# Patient Record
Sex: Female | Born: 1960
Health system: Southern US, Community
[De-identification: ages and names within clinical notes are randomized; demographics above are authoritative.]

## PROBLEM LIST (undated history)

## (undated) DIAGNOSIS — T4145XA Adverse effect of unspecified anesthetic, initial encounter: Secondary | ICD-10-CM

## (undated) DIAGNOSIS — M0520 Rheumatoid vasculitis with rheumatoid arthritis of unspecified site: Secondary | ICD-10-CM

## (undated) DIAGNOSIS — M1990 Unspecified osteoarthritis, unspecified site: Secondary | ICD-10-CM

## (undated) DIAGNOSIS — R7303 Prediabetes: Secondary | ICD-10-CM

## (undated) DIAGNOSIS — R32 Unspecified urinary incontinence: Secondary | ICD-10-CM

## (undated) DIAGNOSIS — G709 Myoneural disorder, unspecified: Secondary | ICD-10-CM

## (undated) DIAGNOSIS — R011 Cardiac murmur, unspecified: Secondary | ICD-10-CM

## (undated) DIAGNOSIS — D219 Benign neoplasm of connective and other soft tissue, unspecified: Secondary | ICD-10-CM

## (undated) DIAGNOSIS — M069 Rheumatoid arthritis, unspecified: Secondary | ICD-10-CM

## (undated) DIAGNOSIS — T8859XA Other complications of anesthesia, initial encounter: Secondary | ICD-10-CM

## (undated) DIAGNOSIS — F32A Depression, unspecified: Secondary | ICD-10-CM

## (undated) HISTORY — PX: JOINT REPLACEMENT: SHX530

## (undated) HISTORY — DX: Rheumatoid vasculitis with rheumatoid arthritis of unspecified site: M05.20

## (undated) HISTORY — DX: Unspecified urinary incontinence: R32

## (undated) HISTORY — PX: FOOT SURGERY: SHX648

## (undated) HISTORY — DX: Benign neoplasm of connective and other soft tissue, unspecified: D21.9

---
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
## 1987-04-08 HISTORY — PX: REPLACEMENT TOTAL KNEE: SUR1224

## 2002-04-07 HISTORY — PX: FOOT SURGERY: SHX648

## 2004-04-07 HISTORY — PX: POSTERIOR FUSION CERVICAL SPINE: SUR628

## 2004-04-07 HISTORY — PX: KNEE SURGERY: SHX244

## 2004-04-08 ENCOUNTER — Ambulatory Visit: Payer: Self-pay | Admitting: Orthopedic Surgery

## 2004-04-24 ENCOUNTER — Ambulatory Visit: Payer: Self-pay | Admitting: Orthopedic Surgery

## 2004-05-01 ENCOUNTER — Ambulatory Visit: Payer: Self-pay | Admitting: Orthopedic Surgery

## 2004-05-15 ENCOUNTER — Ambulatory Visit: Payer: Self-pay | Admitting: Orthopedic Surgery

## 2004-05-21 ENCOUNTER — Encounter (HOSPITAL_COMMUNITY): Admission: RE | Admit: 2004-05-21 | Discharge: 2004-06-20 | Payer: Self-pay | Admitting: Orthopedic Surgery

## 2004-05-23 ENCOUNTER — Ambulatory Visit: Payer: Self-pay | Admitting: Orthopedic Surgery

## 2004-06-13 ENCOUNTER — Ambulatory Visit: Payer: Self-pay | Admitting: Orthopedic Surgery

## 2004-06-20 ENCOUNTER — Encounter (HOSPITAL_COMMUNITY): Admission: RE | Admit: 2004-06-20 | Discharge: 2004-07-20 | Payer: Self-pay | Admitting: Orthopedic Surgery

## 2004-08-08 ENCOUNTER — Ambulatory Visit: Payer: Self-pay | Admitting: Orthopedic Surgery

## 2004-11-11 ENCOUNTER — Ambulatory Visit: Payer: Self-pay | Admitting: Orthopedic Surgery

## 2005-01-01 ENCOUNTER — Inpatient Hospital Stay (HOSPITAL_COMMUNITY): Admission: RE | Admit: 2005-01-01 | Discharge: 2005-01-04 | Payer: Self-pay | Admitting: Neurosurgery

## 2006-04-07 HISTORY — PX: FOOT SURGERY: SHX648

## 2007-09-13 ENCOUNTER — Encounter: Payer: Self-pay | Admitting: Orthopedic Surgery

## 2007-11-10 ENCOUNTER — Ambulatory Visit: Payer: Self-pay | Admitting: Hematology

## 2008-05-18 ENCOUNTER — Encounter: Payer: Self-pay | Admitting: Orthopedic Surgery

## 2009-10-18 ENCOUNTER — Encounter: Payer: Self-pay | Admitting: Orthopedic Surgery

## 2009-11-27 ENCOUNTER — Encounter: Admission: RE | Admit: 2009-11-27 | Discharge: 2010-01-03 | Payer: Self-pay | Admitting: Rheumatology

## 2010-05-07 NOTE — Letter (Signed)
Summary: [facility] Office note  Dr Dessie Coma Office note  Dr Channing Mutters   Imported By: Cammie Sickle 10/30/2009 16:26:10  _____________________________________________________________________  External Attachment:    Type:   Image     Comment:   External Document

## 2010-05-07 NOTE — Letter (Signed)
Summary: [facility] Office note Dr Dessie Coma Office note Dr Channing Mutters   Imported By: Cammie Sickle 05/29/2008 15:12:29  _____________________________________________________________________  External Attachment:    Type:   Image     Comment:   External Document

## 2010-05-07 NOTE — Letter (Signed)
Summary: [facility] Dr Dessie Coma Dr Channing Mutters   Imported By: Cammie Sickle 09/29/2007 18:40:12  _____________________________________________________________________  External Attachment:    Type:   Image     Comment:   External Document

## 2010-08-23 NOTE — H&P (Signed)
NAME:  [name], [name]               ACCOUNT NO.:  [id number]   MEDICAL RECORD NO.:  [id number]          PATIENT TYPE:  INP   LOCATION:  3107                         FACILITY:  [facility]   PHYSICIAN:  Payton Doughty, M.D.      DATE OF BIRTH:  1960/05/12   DATE OF ADMISSION:  01/01/2005  DATE OF DISCHARGE:                                HISTORY & PHYSICAL   ADMITTING DIAGNOSES:  Rheumatoid arthritis with pannus formation, cervical  spondylosis.   A 50 year old right-handed black girl who has moved here from [address] of  [place].  She has had rheumatoid arthritis since she was 16.  MR  demonstrated pannus formation at C2 with compromise of cervicomedullary  junction and she is now admitted for decompression in the occiput to C4  fusion.  She does not have any complaints of numbness in her hands or face.  She does have pain in her back and out toward her hips.   PAST MEDICAL HISTORY:  Rheumatoid arthritis.  She takes Enbrel 50 mg a day,  methotrexate 12.5 mg once a week, Fosamax 70 mg a day, calcium, women's  vitamin, folic acid 1 mg a day, vitamin C, and vitamin B12.  She is allergic  to PENICILLIN, MORPHINE, and SULFA.   PAST SURGICAL HISTORY:  Bilateral knee replacements and foot implant.   SOCIAL HISTORY:  She does not smoke or drink.  Is retired, on disability  from the [employer]   FAMILY HISTORY:  Mom died at 28of ovarian cancer.  Father is deceased of  lung cancer.  There is diabetes and hypertension in the family.   REVIEW OF SYSTEMS:  Remarkable for glasses, arthritis, and neck pain.   PHYSICAL EXAMINATION:  HEENT:  Within normal limits.  She has reasonable  range of motion in her neck with no L'hermitte's sign.  CHEST:  Clear.  CARDIAC:  Regular rate and rhythm.  ABDOMEN:  Nontender.  No hepatosplenomegaly.  EXTREMITIES:  Without clubbing or cyanosis.  GENITOURINARY:  Deferred.  PERIPHERAL PULSES:  Good.  NEUROLOGIC:  She is awake, alert, and oriented.  Pupils are  equal, round,  and reactive to light.  Extraocular movements are intact.  Facial movement  and sensation are intact.  Tongue protrudes in the midline.  Shoulder shrug  is normal.  Palate elevates symmetrically.  Motor examination demonstrates  5/5 strength throughout the upper extremities.  No current sensory deficits.  Reflexes are 1 at the right biceps, ___________at the left, absent at the  triceps, 1 at the brachioradialis.  Hoffman's is negative and there is no  clonus.   MR results have been reviewed above.   CLINICAL IMPRESSION:  Cervical spondylosis with pannus formation secondary  to rheumatoid arthritis.   PLAN:  Posterior cervical fusion from the occiput to C4 with decompression  at C1.  The risks and benefits of this approach have been discussed with her  and she wished to proceed.           ______________________________  Payton Doughty, M.D.     MWR/MEDQ  D:  01/01/2005  T:  01/01/2005  Job:  [id number]

## 2010-08-23 NOTE — Discharge Summary (Signed)
NAME:  [name], [name] [name]               ACCOUNT NO.:  [id number]   MEDICAL RECORD NO.:  [id number]          PATIENT TYPE:  INP   LOCATION:  3003                         FACILITY:  [facility]   PHYSICIAN:  Payton Doughty, M.D.      DATE OF BIRTH:  September 20, 1960   DATE OF ADMISSION:  01/01/2005  DATE OF DISCHARGE:  01/04/2005                                 DISCHARGE SUMMARY   ADMISSION DIAGNOSES:  1.  Rheumatoid arthritis with pannus formation.  2.  Cervical spondylosis.   PROCEDURE:  Occiput to C4 fusion.   BODY OF TEXT:  This is a 50 year old right-handed black girl whose history  and physical is recounted on the chart.  She has rheumatoid arthritis.  She  developed a pannus at C2 and is admitted for occiput to C4 fusion.   General exam was intact, as was neurologic except for the sequelae of the  rheumatoid arthritis.   She was admitted after ascertaining the normal laboratory values and  underwent an occiput to C4 fusion.  Postoperatively she has done well.  She  has spent a couple of days in the ICU mobilizing.  She needs some help  because of her rheumatoid arthritis.   On exam, her strength is full at its baseline.  Her incision had several  days of drainage, but this is stopped.  She remains afebrile.  She is being  discharged home on her rheumatoid medications, including methotrexate.  Her  follow-up will be in the [facility] office in about  10 days for sutures.           ______________________________  Payton Doughty, M.D.     MWR/MEDQ  D:  01/04/2005  T:  01/04/2005  Job:  [id number]

## 2010-08-23 NOTE — Op Note (Signed)
NAME:  [name] [name], [name] [name]               ACCOUNT NO.:  [id number]   MEDICAL RECORD NO.:  [id number]          PATIENT TYPE:  INP   LOCATION:  2858                         FACILITY:  [facility]   PHYSICIAN:  Payton Doughty, M.D.      DATE OF BIRTH:  Aug 10, 1960   DATE OF PROCEDURE:  01/01/2005  DATE OF DISCHARGE:                                 OPERATIVE REPORT   PREOPERATIVE DIAGNOSIS:  Rheumatoid arthritis with pannus formation at C1-2.   POSTOPERATIVE DIAGNOSIS:  Rheumatoid arthritis with pannus formation at C1-  2.   PROCEDURE:  Occiput to C4 fusion.   SURGEON:  Payton Doughty, M.D.   ANESTHESIA:  General endotracheal.  Prepped with Betadine prep and scrubbed  with alcohol wipe.   COMPLICATIONS:  None.   NURSE ASSISTANT:  [place].   DOCTOR ASSISTANT:  Hewitt Shorts, M.D.   INDICATIONS FOR PROCEDURE:  This is a 50 year old girl with rheumatoid  arthritis and compromise at the cervicomedullary junction when in flexion.   DESCRIPTION OF PROCEDURE:  Taken to the operating room and smoothly  anesthetized and intubated.  Placed on the Stryker bed which was turned  prone and the neck placed in the extended position where her dens was  reduced.  Following shave, prepped and draped in the usual sterile fashion.  Skin was infiltrated with 1% lidocaine with 1:400,000 epinephrine.  The skin  was then incised from the occiput to the spinous process of C4.  In the  occipital squama, the lamina of C1, C2, C3, and C4 were exposed bilaterally  in the subperiosteal plane.  Plate was affixed to the occiput with 8 mm  screws.  The lateral masses of C3 and C4 had screws placed within them and  translaminar screws were placed at C2.  Rod was contoured to fit to these  screws and reduced into them and placed appropriately.  Following placement  of the rod, caps were placed on and tightened down.  Small midline  laminectomy of C1 was done to decompress the cervical medullary junction  posteriorly.  The  occiput and lateral masses of C1, C2, C3, and C4 were  decorticated with a high speed drill and BMP on the extender matrix was  placed over them.  The subcutaneous tissues and fascia were reapproximated  with 0 Vicryl in interrupted fashion.  Subcuticular tissues reapproximated  with 2-0 Vicryl in interrupted fashion.  Skin was closed with 3-0 nylon in a  running locking fashion.  Betadine and Telfa dressing was applied and made  occlusive with Op-Site.  The patient was placed in Aspen collar, returned to  the supine position, and returned to the recovery room in good condition.          ______________________________  Payton Doughty, M.D.    MWR/MEDQ  D:  01/01/2005  T:  01/02/2005  Job:  [id number]

## 2011-08-26 ENCOUNTER — Emergency Department (HOSPITAL_COMMUNITY): Payer: Federal, State, Local not specified - PPO

## 2011-08-26 ENCOUNTER — Emergency Department (HOSPITAL_COMMUNITY)
Admission: EM | Admit: 2011-08-26 | Discharge: 2011-08-26 | Disposition: A | Discharge status: Home / Self Care | Payer: Federal, State, Local not specified - PPO | Attending: Emergency Medicine | Admitting: Emergency Medicine

## 2011-08-26 ENCOUNTER — Encounter (HOSPITAL_COMMUNITY): Payer: Self-pay | Admitting: Cardiology

## 2011-08-26 DIAGNOSIS — M5020 Other cervical disc displacement, unspecified cervical region: Secondary | ICD-10-CM | POA: Insufficient documentation

## 2011-08-26 DIAGNOSIS — S1093XA Contusion of unspecified part of neck, initial encounter: Secondary | ICD-10-CM | POA: Insufficient documentation

## 2011-08-26 DIAGNOSIS — W1800XA Striking against unspecified object with subsequent fall, initial encounter: Secondary | ICD-10-CM

## 2011-08-26 DIAGNOSIS — S0003XA Contusion of scalp, initial encounter: Secondary | ICD-10-CM | POA: Insufficient documentation

## 2011-08-26 DIAGNOSIS — Z8739 Personal history of other diseases of the musculoskeletal system and connective tissue: Secondary | ICD-10-CM | POA: Insufficient documentation

## 2011-08-26 DIAGNOSIS — W1809XA Striking against other object with subsequent fall, initial encounter: Secondary | ICD-10-CM | POA: Insufficient documentation

## 2011-08-26 HISTORY — DX: Unspecified osteoarthritis, unspecified site: M19.90

## 2011-08-26 MED ORDER — HYDROCODONE-ACETAMINOPHEN 5-325 MG PO TABS: 2.0000 | ORAL_TABLET | ORAL | Status: AC | PRN

## 2011-08-26 NOTE — ED Provider Notes (Signed)
History     CSN: [id number]  Arrival date & time 08/26/11  1705   First MD Initiated Contact with Patient 08/26/11 1714      Chief Complaint  Patient presents with  . Fall     HPI Pt to department via EMS- pt reports that she fell backwards and hit her head on an [place] in the center of the kitchen. Denies any LOC with the fall. Reports history of decreased sensation. Pt on LSB and in c-collar on arrival. Pt A&Ox4.   Past Medical History  Diagnosis Date  . Arthritis     History reviewed. No pertinent past surgical history.  History reviewed. No pertinent family history.  History  Substance Use Topics  . Smoking status: Not on file  . Smokeless tobacco: Not on file  . Alcohol Use:     OB History    Grav Para Term Preterm Abortions TAB SAB Ect Mult Living                  Review of Systems  All other systems reviewed and are negative.    Allergies  Morphine and related; Sulfa antibiotics; and Penicillins  Home Medications   Current Outpatient Rx  Name Route Sig Dispense Refill  . CITALOPRAM HYDROBROMIDE 20 MG PO TABS Oral Take 20 mg by mouth at bedtime.    Marland Kitchen ETANERCEPT 50 MG/ML [place] SOLN Subcutaneous Inject 50 mg into the skin once a week. Patient injects on either Fridays or Mondays.    Marland Kitchen METHOTREXATE SODIUM (PF) 50 MG/2ML IJ SOLN Subcutaneous Inject 17.5 mg into the skin once a week. Injects on Mondays. 0.7 mL = 17.5 MG    . HYDROCODONE-ACETAMINOPHEN 5-325 MG PO TABS Oral Take 2 tablets by mouth every 4 (four) hours as needed for pain. 6 tablet 0    BP 136/85  Pulse 61  Temp(Src) 98.5 F (36.9 C) (Oral)  Resp 18  SpO2 100%  Physical Exam  Nursing note and vitals reviewed. Constitutional: She is oriented to person, place, and time. She appears well-developed and well-nourished. No distress.  HENT:  Head: Normocephalic.    Eyes: Pupils are equal, round, and reactive to light.  Neck: Normal range of motion.  Cardiovascular: Normal rate and intact  distal pulses.   Pulmonary/Chest: No respiratory distress.  Abdominal: Normal appearance. She exhibits no distension.  Musculoskeletal: Normal range of motion.  Neurological: She is alert and oriented to person, place, and time. No cranial nerve deficit.  Skin: Skin is warm and dry. No rash noted.  Psychiatric: She has a normal mood and affect. Her behavior is normal.    ED Course  Procedures (including critical care time)  Labs Reviewed - No data to display Ct Head Wo Contrast  08/26/2011  *RADIOLOGY REPORT*  Clinical Data:  Larey Seat, striking the back of the head on her kitchen [place].  Prior upper cervical posterior fusion.  CT HEAD WITHOUT CONTRAST CT CERVICAL SPINE WITHOUT CONTRAST  Technique:  Multidetector CT imaging of the head and cervical spine was performed following the standard protocol without intravenous contrast.  Multiplanar CT image reconstructions of the cervical spine were also generated.  Comparison:   None  CT HEAD  Findings: Metallic beam hardening streak artifact from hardware at the occiput. Ventricular system normal in size and appearance for age.  No mass lesion.  No midline shift.  No acute hemorrhage or hematoma.  No extra-axial fluid collections.  No evidence of acute infarction.  No focal brain parenchymal  abnormalities.  Left posterior parietal scalp hematoma.  No skull fractures or other focal osseous abnormalities involving the skull.  Visualized paranasal sinuses, mastoid air cells, and middle ear cavities well- aerated.  IMPRESSION:  1.  Normal intracranially. 2.  Left posterior parietal scalp hematoma without underlying skull fracture.  CT CERVICAL SPINE  Findings: Prior posterior fusion from the occiput through C4. Hardware intact.  No fractures involving the cervical spine. Sagittal reconstructed images demonstrate aggressive osteopenia involving the upper cervical spine, though the posterior fusion appears solid.  Severe degenerative changes in the right facet joints  at C4-5, and mild degenerative changes in the left facet joints at C5-6.  Chronic disc protrusion with calcification the posterior annular fibers at C6-7 with borderline spinal stenosis.  IMPRESSION:  1.  No cervical spine fractures identified. 2.  Solid appearing posterior fusion from the occiput through C4. 3.  Aggressive osteopenia involving the dens. 4.  Chronic central disc protrusion at C6-7 with calcification of the posterior annular fibers and borderline spinal stenosis.  Original Report Authenticated By: Arnell Sieving, M.D.   Ct Cervical Spine Wo Contrast  08/26/2011  *RADIOLOGY REPORT*  Clinical Data:  Larey Seat, striking the back of the head on her kitchen [place].  Prior upper cervical posterior fusion.  CT HEAD WITHOUT CONTRAST CT CERVICAL SPINE WITHOUT CONTRAST  Technique:  Multidetector CT imaging of the head and cervical spine was performed following the standard protocol without intravenous contrast.  Multiplanar CT image reconstructions of the cervical spine were also generated.  Comparison:   None  CT HEAD  Findings: Metallic beam hardening streak artifact from hardware at the occiput. Ventricular system normal in size and appearance for age.  No mass lesion.  No midline shift.  No acute hemorrhage or hematoma.  No extra-axial fluid collections.  No evidence of acute infarction.  No focal brain parenchymal abnormalities.  Left posterior parietal scalp hematoma.  No skull fractures or other focal osseous abnormalities involving the skull.  Visualized paranasal sinuses, mastoid air cells, and middle ear cavities well- aerated.  IMPRESSION:  1.  Normal intracranially. 2.  Left posterior parietal scalp hematoma without underlying skull fracture.  CT CERVICAL SPINE  Findings: Prior posterior fusion from the occiput through C4. Hardware intact.  No fractures involving the cervical spine. Sagittal reconstructed images demonstrate aggressive osteopenia involving the upper cervical spine, though the  posterior fusion appears solid.  Severe degenerative changes in the right facet joints at C4-5, and mild degenerative changes in the left facet joints at C5-6.  Chronic disc protrusion with calcification the posterior annular fibers at C6-7 with borderline spinal stenosis.  IMPRESSION:  1.  No cervical spine fractures identified. 2.  Solid appearing posterior fusion from the occiput through C4. 3.  Aggressive osteopenia involving the dens. 4.  Chronic central disc protrusion at C6-7 with calcification of the posterior annular fibers and borderline spinal stenosis.  Original Report Authenticated By: Arnell Sieving, M.D.     1. Scalp hematoma   2. Fall against object       MDM        Nelia Shi, MD 08/26/11 1901

## 2011-08-26 NOTE — ED Notes (Signed)
Pt removed from the LSB, but c-collar remains in place.

## 2011-08-26 NOTE — Discharge Instructions (Signed)
Contusion A contusion is a deep bruise. Contusions happen when an injury causes bleeding under the skin. Signs of bruising include pain, puffiness (swelling), and discolored skin. The contusion may turn blue, purple, or yellow. HOME CARE   Put ice on the injured area.   Put ice in a plastic bag.   Place a towel between your skin and the bag.   Leave the ice on for 15 to 20 minutes, 3 to 4 times a day.   Only take medicine as told by your doctor.   Rest the injured area.   If possible, raise (elevate) the injured area to lessen puffiness.  GET HELP RIGHT AWAY IF:   You have more bruising or puffiness.   You have pain that is getting worse.   Your puffiness or pain is not helped by medicine.  MAKE SURE YOU:   Understand these instructions.   Will watch your condition.   Will get help right away if you are not doing well or get worse.  Document Released: 09/10/2007 Document Revised: 03/13/2011 Document Reviewed: 01/27/2011 [facility] Patient Information 2012 [place], [place].Head Injury, Adult You have had a head injury that does not appear serious at this time. A concussion is a state of changed mental ability, usually from a blow to the head. You should take clear liquids for the rest of the day and then resume your regular diet. You should not take sedatives or alcoholic beverages for as long as directed by your caregiver after discharge. After injuries such as yours, most problems occur within the first 24 hours. SYMPTOMS These minor symptoms may be experienced after discharge:  Memory difficulties.   Dizziness.   Headaches.   Double vision.   Hearing difficulties.   Depression.   Tiredness.   Weakness.   Difficulty with concentration.  If you experience any of these problems, you should not be alarmed. A concussion requires a few days for recovery. Many patients with head injuries frequently experience such symptoms. Usually, these problems disappear without  medical care. If symptoms last for more than one day, notify your caregiver. See your caregiver sooner if symptoms are becoming worse rather than better. HOME CARE INSTRUCTIONS   During the next 24 hours you must stay with someone who can watch you for the warning signs listed below.  Although it is unlikely that serious side effects will occur, you should be aware of signs and symptoms which may necessitate your return to this location. Side effects may occur up to 7 - 10 days following the injury. It is important for you to carefully monitor your condition and contact your caregiver or seek immediate medical attention if there is a change in your condition. SEEK IMMEDIATE MEDICAL CARE IF:   There is confusion or drowsiness.   You can not awaken the injured person.   There is nausea (feeling sick to your stomach) or continued, forceful vomiting.   You notice dizziness or unsteadiness which is getting worse, or inability to walk.   You have convulsions or unconsciousness.   You experience severe, persistent headaches not relieved by over-the-counter or prescription medicines for pain. (Do not take aspirin as this impairs clotting abilities). Take other pain medications only as directed.   You can not use arms or legs normally.   There is clear or bloody discharge from the nose or ears.  MAKE SURE YOU:   Understand these instructions.   Will watch your condition.   Will get help right away if you are  not doing well or get worse.  Document Released: 03/24/2005 Document Revised: 03/13/2011 Document Reviewed: 02/09/2009 [facility] Patient Information 2012 [place], [place].

## 2011-08-26 NOTE — ED Notes (Signed)
Pt to department via EMS- pt reports that she fell backwards and hit her head on an [place] in the center of the kitchen. Denies any LOC with the fall. Reports history of decreased sensation. Pt on LSB and in c-collar on arrival. Pt A&Ox4.

## 2012-01-08 ENCOUNTER — Encounter: Payer: Self-pay | Admitting: Orthopedic Surgery

## 2012-01-08 ENCOUNTER — Ambulatory Visit: Payer: Federal, State, Local not specified - PPO | Admitting: Orthopedic Surgery

## 2012-08-19 ENCOUNTER — Encounter (HOSPITAL_COMMUNITY): Payer: Self-pay | Admitting: Pharmacy Technician

## 2012-08-20 ENCOUNTER — Other Ambulatory Visit: Payer: Self-pay | Admitting: Obstetrics and Gynecology

## 2012-08-25 ENCOUNTER — Encounter (HOSPITAL_COMMUNITY): Payer: Self-pay

## 2012-08-25 ENCOUNTER — Encounter (HOSPITAL_COMMUNITY)
Admission: RE | Admit: 2012-08-25 | Discharge: 2012-08-25 | Disposition: A | Discharge status: Home / Self Care | Payer: Federal, State, Local not specified - PPO | Source: Ambulatory Visit | Attending: Obstetrics and Gynecology | Admitting: Obstetrics and Gynecology

## 2012-08-25 DIAGNOSIS — Z01818 Encounter for other preprocedural examination: Secondary | ICD-10-CM | POA: Insufficient documentation

## 2012-08-25 DIAGNOSIS — Z01812 Encounter for preprocedural laboratory examination: Secondary | ICD-10-CM | POA: Insufficient documentation

## 2012-08-25 HISTORY — DX: Adverse effect of unspecified anesthetic, initial encounter: T41.45XA

## 2012-08-25 HISTORY — DX: Other complications of anesthesia, initial encounter: T88.59XA

## 2012-08-25 LAB — BASIC METABOLIC PANEL
BUN: 11 mg/dL (ref 6–23)
CO2: 24 mEq/L (ref 19–32)
Calcium: 8.9 mg/dL (ref 8.4–10.5)
Chloride: 103 mEq/L (ref 96–112)
Creatinine, Ser: 0.7 mg/dL (ref 0.50–1.10)
GFR calc Af Amer: >90 mL/min (ref >90)
GFR calc non Af Amer: >90 mL/min (ref >90)
Glucose, Bld: 86 mg/dL (ref 70–99)
Potassium: 3.8 mEq/L (ref 3.5–5.1)
Sodium: 136 mEq/L (ref 135–145)

## 2012-08-25 LAB — CBC
HCT: 34.3 % — ABNORMAL LOW (ref 36.0–46.0)
Hemoglobin: 11.2 g/dL — ABNORMAL LOW (ref 12.0–15.0)
MCH: 27.5 pg (ref 26.0–34.0)
MCHC: 32.7 g/dL (ref 30.0–36.0)
MCV: 84.3 fL (ref 78.0–100.0)
Platelets: 296 10*3/uL (ref 150–400)
RBC: 4.07 MIL/uL (ref 3.87–5.11)
RDW: 14.3 % (ref 11.5–15.5)
WBC: 7.6 10*3/uL (ref 4.0–10.5)

## 2012-08-25 LAB — SURGICAL PCR SCREEN
MRSA, PCR: NEGATIVE
Staphylococcus aureus: NEGATIVE

## 2012-08-25 NOTE — Anesthesia Preprocedure Evaluation (Addendum)
Anesthesia Evaluation  Patient identified by MRN, date of birth, ID band Patient awake  General Assessment Comment:Had irregular heartbeat with neck fusion  Reviewed: Allergy & Precautions, H&P , Patient's Chart, lab work & pertinent test results  Airway Mallampati: III TM Distance: >3 FB Neck ROM: Limited  Mouth opening: Limited Mouth Opening  Dental no notable dental hx. (+) Teeth Intact and Caps   Pulmonary neg pulmonary ROS,  breath sounds clear to auscultation  Pulmonary exam normal       Cardiovascular negative cardio ROS  Rhythm:Regular Rate:Normal     Neuro/Psych negative neurological ROS  negative psych ROS   GI/Hepatic negative GI ROS, Neg liver ROS,   Endo/Other  negative endocrine ROS  Renal/GU negative Renal ROS  Female GU complaint     Musculoskeletal  (+) Arthritis -, Rheumatoid disorders,    Abdominal   Peds  Hematology negative hematology ROS (+)   Anesthesia Other Findings   Reproductive/Obstetrics negative OB ROS fibroid uterus Family Hx/o of Ovarian Ca                          Anesthesia Physical Anesthesia Plan  ASA: III  Anesthesia Plan: Epidural and MAC   Post-op Pain Management:    Induction: Intravenous  Airway Management Planned:   Additional Equipment:   Intra-op Plan:   Post-operative Plan: Extubation in OR  Informed Consent: I have reviewed the patients History and Physical, chart, labs and discussed the procedure including the risks, benefits and alternatives for the proposed anesthesia with the patient or authorized representative who has indicated his/her understanding and acceptance.   Dental advisory given  Plan Discussed with: CRNA, Anesthesiologist and Surgeon  Anesthesia Plan Comments: (Patient will probably require awake fiberoptic intubation as she has virtually no mobility of her cervical spine. Explained to patient in  detail.  09/03/12 - Patient disclosed that her neurosurgeon, Dr Channing Mutters, has told her that she should never have general anesthesia with a breathing tube since she could be paralyzed.  We have discussed this information with Dr Cherly Hensen.  Together with Dr Cherly Hensen, we discussed this with the patient, and the plan has changed to TAH/BSO under epidural/MAC anesthesia.  )       Anesthesia Quick Evaluation

## 2012-08-25 NOTE — Patient Instructions (Addendum)
20 [name] [name]  08/25/2012   Your procedure is scheduled on:  09/03/12  Enter through the Main Entrance of [facility] at 6 AM.  Pick up the phone at the desk and dial [phone].   Call this number if you have problems the morning of surgery: [phone]   Remember:   Do not eat food:After Midnight.  Do not drink clear liquids: After Midnight.  Take these medicines the morning of surgery with A SIP OF WATER: NA   Do not wear jewelry, make-up or nail polish.  Do not wear lotions, powders, or perfumes. You may wear deodorant.  Do not shave 48 hours prior to surgery.  Do not bring valuables to the hospital.  Contacts, dentures or bridgework may not be worn into surgery.  Leave suitcase in the car. After surgery it may be brought to your room.  For patients admitted to the hospital, checkout time is 11:00 AM the day of discharge.   Patients discharged the day of surgery will not be allowed to drive home.  Name and phone number of your driver: NA  Special Instructions: Shower using CHG 2 nights before surgery and the night before surgery.  If you shower the day of surgery use CHG.  Use special wash - you have one bottle of CHG for all showers.  You should use approximately 1/3 of the bottle for each shower.   Please read over the following fact sheets that you were given: MRSA Information

## 2012-09-02 MED ORDER — CLINDAMYCIN PHOSPHATE 900 MG/50ML IV SOLN
900.0000 mg | INTRAVENOUS | Status: AC
  Administered 2012-09-03: 900 mg via INTRAVENOUS
  Filled 2012-09-02: qty 50

## 2012-09-02 MED ORDER — CIPROFLOXACIN IN D5W 400 MG/200ML IV SOLN
400.0000 mg | INTRAVENOUS | Status: AC
  Administered 2012-09-03: 400 mg via INTRAVENOUS
  Filled 2012-09-02: qty 200

## 2012-09-03 ENCOUNTER — Encounter (HOSPITAL_COMMUNITY)
Admission: RE | Disposition: A | Discharge status: Home / Self Care | Payer: Self-pay | Source: Ambulatory Visit | Attending: Obstetrics and Gynecology

## 2012-09-03 ENCOUNTER — Encounter (HOSPITAL_COMMUNITY): Payer: Self-pay

## 2012-09-03 ENCOUNTER — Inpatient Hospital Stay (HOSPITAL_COMMUNITY)
Admission: RE | Admit: 2012-09-03 | Discharge: 2012-09-07 | DRG: 743 | Disposition: A | Discharge status: Home / Self Care | Payer: Medicare Other | Source: Ambulatory Visit | Attending: Obstetrics and Gynecology | Admitting: Obstetrics and Gynecology

## 2012-09-03 ENCOUNTER — Inpatient Hospital Stay (HOSPITAL_COMMUNITY): Payer: Medicare Other

## 2012-09-03 ENCOUNTER — Encounter (HOSPITAL_COMMUNITY): Payer: Self-pay | Admitting: *Deleted

## 2012-09-03 DIAGNOSIS — Z9079 Acquired absence of other genital organ(s): Secondary | ICD-10-CM

## 2012-09-03 DIAGNOSIS — D252 Subserosal leiomyoma of uterus: Principal | ICD-10-CM | POA: Diagnosis present

## 2012-09-03 DIAGNOSIS — Z8041 Family history of malignant neoplasm of ovary: Secondary | ICD-10-CM

## 2012-09-03 DIAGNOSIS — N8000 Endometriosis of the uterus, unspecified: Secondary | ICD-10-CM | POA: Diagnosis present

## 2012-09-03 DIAGNOSIS — Z90710 Acquired absence of both cervix and uterus: Secondary | ICD-10-CM

## 2012-09-03 DIAGNOSIS — D251 Intramural leiomyoma of uterus: Secondary | ICD-10-CM | POA: Diagnosis present

## 2012-09-03 DIAGNOSIS — N950 Postmenopausal bleeding: Secondary | ICD-10-CM | POA: Diagnosis present

## 2012-09-03 HISTORY — PX: ABDOMINAL HYSTERECTOMY: SHX81

## 2012-09-03 HISTORY — PX: SALPINGOOPHORECTOMY: SHX82

## 2012-09-03 SURGERY — SALPINGO-OOPHORECTOMY, OPEN
Anesthesia: Monitor Anesthesia Care | Site: Abdomen | Wound class: Clean Contaminated

## 2012-09-03 MED ORDER — NALOXONE HCL 0.4 MG/ML IJ SOLN: 0.4000 mg | INTRAMUSCULAR | Status: DC | PRN

## 2012-09-03 MED ORDER — 0.9 % SODIUM CHLORIDE (POUR BTL) OPTIME
TOPICAL | Status: DC | PRN
  Administered 2012-09-03 (×2): 1000 mL

## 2012-09-03 MED ORDER — EPHEDRINE SULFATE 50 MG/ML IJ SOLN
INTRAMUSCULAR | Status: DC | PRN
  Administered 2012-09-03 (×2): 10 mg via INTRAVENOUS

## 2012-09-03 MED ORDER — NALOXONE HCL 1 MG/ML IJ SOLN
1.0000 ug/kg/h | INTRAVENOUS | Status: DC | PRN
  Filled 2012-09-03: qty 2

## 2012-09-03 MED ORDER — PROPOFOL 10 MG/ML IV EMUL
INTRAVENOUS | Status: AC
  Filled 2012-09-03: qty 20

## 2012-09-03 MED ORDER — SODIUM BICARBONATE 8.4 % IV SOLN
INTRAVENOUS | Status: DC | PRN
  Administered 2012-09-03: 5 mL via EPIDURAL

## 2012-09-03 MED ORDER — DIPHENHYDRAMINE HCL 50 MG/ML IJ SOLN: 12.5000 mg | INTRAMUSCULAR | Status: DC | PRN

## 2012-09-03 MED ORDER — ZOLPIDEM TARTRATE 5 MG PO TABS: 5.0000 mg | ORAL_TABLET | Freq: Every evening | ORAL | Status: DC | PRN

## 2012-09-03 MED ORDER — HYDROMORPHONE HCL PF 1 MG/ML IJ SOLN: 0.2000 mg | INTRAMUSCULAR | Status: DC | PRN

## 2012-09-03 MED ORDER — SCOPOLAMINE 1 MG/3DAYS TD PT72
MEDICATED_PATCH | TRANSDERMAL | Status: AC
  Administered 2012-09-03: 1.5 mg via TRANSDERMAL
  Filled 2012-09-03: qty 1

## 2012-09-03 MED ORDER — PROPOFOL 10 MG/ML IV BOLUS
INTRAVENOUS | Status: DC | PRN
  Administered 2012-09-03 (×6): 10 mg via INTRAVENOUS

## 2012-09-03 MED ORDER — DIPHENHYDRAMINE HCL 50 MG/ML IJ SOLN: 25.0000 mg | INTRAMUSCULAR | Status: DC | PRN

## 2012-09-03 MED ORDER — DEXTROSE IN LACTATED RINGERS 5 % IV SOLN
INTRAVENOUS | Status: DC
  Administered 2012-09-03 – 2012-09-04 (×2): via INTRAVENOUS

## 2012-09-03 MED ORDER — MIDAZOLAM HCL 2 MG/2ML IJ SOLN
INTRAMUSCULAR | Status: AC
  Filled 2012-09-03: qty 2

## 2012-09-03 MED ORDER — STERILE WATER FOR IRRIGATION IR SOLN
Status: DC | PRN
  Administered 2012-09-03: 09:00:00 via INTRAVESICAL

## 2012-09-03 MED ORDER — ACETAMINOPHEN 10 MG/ML IV SOLN
1000.0000 mg | Freq: Once | INTRAVENOUS | Status: AC
  Administered 2012-09-03: 1000 mg via INTRAVENOUS

## 2012-09-03 MED ORDER — NALBUPHINE HCL 10 MG/ML IJ SOLN
5.0000 mg | INTRAMUSCULAR | Status: DC | PRN
  Filled 2012-09-03: qty 1

## 2012-09-03 MED ORDER — KETOROLAC TROMETHAMINE 30 MG/ML IJ SOLN: 30.0000 mg | Freq: Four times a day (QID) | INTRAMUSCULAR | Status: AC | PRN

## 2012-09-03 MED ORDER — ONDANSETRON HCL 4 MG/2ML IJ SOLN
INTRAMUSCULAR | Status: DC | PRN
  Administered 2012-09-03: 4 mg via INTRAVENOUS

## 2012-09-03 MED ORDER — DEXAMETHASONE SODIUM PHOSPHATE 4 MG/ML IJ SOLN
INTRAMUSCULAR | Status: DC | PRN
  Administered 2012-09-03: 10 mg via INTRAVENOUS

## 2012-09-03 MED ORDER — FENTANYL CITRATE 0.05 MG/ML IJ SOLN
INTRAMUSCULAR | Status: DC | PRN
  Administered 2012-09-03: 25 ug via INTRAVENOUS
  Administered 2012-09-03: 12.5 ug via INTRAVENOUS
  Administered 2012-09-03 (×5): 25 ug via INTRAVENOUS
  Administered 2012-09-03: 50 ug via INTRAVENOUS
  Administered 2012-09-03: 12.5 ug via INTRAVENOUS
  Administered 2012-09-03: 25 ug via INTRAVENOUS

## 2012-09-03 MED ORDER — ACETAMINOPHEN 10 MG/ML IV SOLN
INTRAVENOUS | Status: AC
  Filled 2012-09-03: qty 100

## 2012-09-03 MED ORDER — LACTATED RINGERS IV SOLN
INTRAVENOUS | Status: DC
  Administered 2012-09-03: 50 mL/h via INTRAVENOUS
  Administered 2012-09-03 (×3): via INTRAVENOUS
  Administered 2012-09-03: 50 mL/h via INTRAVENOUS

## 2012-09-03 MED ORDER — ONDANSETRON HCL 4 MG/2ML IJ SOLN
INTRAMUSCULAR | Status: AC
  Filled 2012-09-03: qty 2

## 2012-09-03 MED ORDER — ONDANSETRON HCL 4 MG PO TABS
4.0000 mg | ORAL_TABLET | Freq: Four times a day (QID) | ORAL | Status: DC | PRN
  Administered 2012-09-04: 4 mg via ORAL
  Filled 2012-09-03: qty 1

## 2012-09-03 MED ORDER — GLYCOPYRROLATE 0.2 MG/ML IJ SOLN
INTRAMUSCULAR | Status: AC
  Filled 2012-09-03: qty 1

## 2012-09-03 MED ORDER — ONDANSETRON HCL 4 MG/2ML IJ SOLN
4.0000 mg | Freq: Three times a day (TID) | INTRAMUSCULAR | Status: DC | PRN
  Filled 2012-09-03: qty 2

## 2012-09-03 MED ORDER — MENTHOL 3 MG MT LOZG: 1.0000 | LOZENGE | OROMUCOSAL | Status: DC | PRN

## 2012-09-03 MED ORDER — SODIUM CHLORIDE 0.9 % IJ SOLN: 3.0000 mL | INTRAMUSCULAR | Status: DC | PRN

## 2012-09-03 MED ORDER — LIDOCAINE-EPINEPHRINE (PF) 2 %-1:200000 IJ SOLN
INTRAMUSCULAR | Status: AC
Start: 2012-09-03 — End: 2012-09-03
  Filled 2012-09-03: qty 20

## 2012-09-03 MED ORDER — BUPIVACAINE HCL (PF) 0.25 % IJ SOLN
INTRAMUSCULAR | Status: AC
  Filled 2012-09-03: qty 30

## 2012-09-03 MED ORDER — OXYCODONE-ACETAMINOPHEN 5-325 MG PO TABS
1.0000 | ORAL_TABLET | ORAL | Status: DC | PRN
  Administered 2012-09-04: 1 via ORAL
  Filled 2012-09-03: qty 1

## 2012-09-03 MED ORDER — ACETAMINOPHEN 10 MG/ML IV SOLN: 1000.0000 mg | Freq: Four times a day (QID) | INTRAVENOUS | Status: DC

## 2012-09-03 MED ORDER — DIPHENHYDRAMINE HCL 25 MG PO CAPS: 25.0000 mg | ORAL_CAPSULE | ORAL | Status: DC | PRN

## 2012-09-03 MED ORDER — BUPIVACAINE HCL (PF) 0.25 % IJ SOLN
INTRAMUSCULAR | Status: DC | PRN
  Administered 2012-09-03: 6 mL

## 2012-09-03 MED ORDER — FENTANYL CITRATE 0.05 MG/ML IJ SOLN
INTRAMUSCULAR | Status: AC
  Filled 2012-09-03: qty 5

## 2012-09-03 MED ORDER — ROPIVACAINE HCL 5 MG/ML IJ SOLN
INTRAMUSCULAR | Status: AC
  Filled 2012-09-03: qty 60

## 2012-09-03 MED ORDER — METOCLOPRAMIDE HCL 5 MG/ML IJ SOLN
10.0000 mg | Freq: Three times a day (TID) | INTRAMUSCULAR | Status: DC | PRN
  Administered 2012-09-04: 10 mg via INTRAVENOUS
  Filled 2012-09-03 (×2): qty 2

## 2012-09-03 MED ORDER — DEXAMETHASONE SODIUM PHOSPHATE 10 MG/ML IJ SOLN
INTRAMUSCULAR | Status: AC
  Filled 2012-09-03: qty 1

## 2012-09-03 MED ORDER — KETOROLAC TROMETHAMINE 30 MG/ML IJ SOLN
30.0000 mg | Freq: Four times a day (QID) | INTRAMUSCULAR | Status: AC | PRN
  Administered 2012-09-03: 30 mg via INTRAVENOUS
  Filled 2012-09-03 (×2): qty 1

## 2012-09-03 MED ORDER — METHYLENE BLUE 1 % INJ SOLN
INTRAMUSCULAR | Status: AC
  Filled 2012-09-03: qty 1

## 2012-09-03 MED ORDER — IBUPROFEN 800 MG PO TABS
800.0000 mg | ORAL_TABLET | Freq: Three times a day (TID) | ORAL | Status: DC | PRN
  Filled 2012-09-03: qty 1

## 2012-09-03 MED ORDER — MEPERIDINE HCL 25 MG/ML IJ SOLN: 6.2500 mg | INTRAMUSCULAR | Status: DC | PRN

## 2012-09-03 MED ORDER — SODIUM CHLORIDE 0.9 % IV SOLN
INTRAVENOUS | Status: DC
  Administered 2012-09-03 (×2): via EPIDURAL
  Filled 2012-09-03 (×11): qty 20

## 2012-09-03 MED ORDER — ONDANSETRON HCL 4 MG/2ML IJ SOLN
4.0000 mg | Freq: Four times a day (QID) | INTRAMUSCULAR | Status: DC | PRN
  Administered 2012-09-03 – 2012-09-04 (×2): 4 mg via INTRAVENOUS
  Filled 2012-09-03: qty 2

## 2012-09-03 MED ORDER — SCOPOLAMINE 1 MG/3DAYS TD PT72: 1.0000 | MEDICATED_PATCH | Freq: Once | TRANSDERMAL | Status: AC

## 2012-09-03 MED ORDER — KETOROLAC TROMETHAMINE 30 MG/ML IJ SOLN
INTRAMUSCULAR | Status: AC
  Filled 2012-09-03: qty 1

## 2012-09-03 MED ORDER — NEOSTIGMINE METHYLSULFATE 1 MG/ML IJ SOLN
INTRAMUSCULAR | Status: AC
  Filled 2012-09-03: qty 1

## 2012-09-03 MED ORDER — PANTOPRAZOLE SODIUM 40 MG PO TBEC
40.0000 mg | DELAYED_RELEASE_TABLET | Freq: Every day | ORAL | Status: DC
  Administered 2012-09-04: 40 mg via ORAL
  Filled 2012-09-03 (×4): qty 1

## 2012-09-03 MED ORDER — EPHEDRINE 5 MG/ML INJ
INTRAVENOUS | Status: AC
Start: 2012-09-03 — End: 2012-09-03
  Filled 2012-09-03: qty 10

## 2012-09-03 MED ORDER — MIDAZOLAM HCL 5 MG/5ML IJ SOLN
INTRAMUSCULAR | Status: DC | PRN
  Administered 2012-09-03 (×2): .5 mg via INTRAVENOUS

## 2012-09-03 MED ORDER — LIDOCAINE HCL (CARDIAC) 20 MG/ML IV SOLN
INTRAVENOUS | Status: AC
  Filled 2012-09-03: qty 5

## 2012-09-03 MED ORDER — FENTANYL CITRATE 0.05 MG/ML IJ SOLN: 25.0000 ug | INTRAMUSCULAR | Status: DC | PRN

## 2012-09-03 SURGICAL SUPPLY — 79 items
ADH SKN CLS APL DERMABOND .7 (GAUZE/BANDAGES/DRESSINGS) ×3
BAG URINE DRAINAGE (UROLOGICAL SUPPLIES) ×4 IMPLANT
BARRIER ADHS 3X4 INTERCEED (GAUZE/BANDAGES/DRESSINGS) ×2 IMPLANT
BRR ADH 4X3 ABS CNTRL BYND (GAUZE/BANDAGES/DRESSINGS)
CATH FOLEY 3WAY  5CC 16FR (CATHETERS) ×1
CATH FOLEY 3WAY 5CC 16FR (CATHETERS) ×3 IMPLANT
CHLORAPREP W/TINT 26ML (MISCELLANEOUS) ×4 IMPLANT
CLOTH BEACON ORANGE TIMEOUT ST (SAFETY) ×4 IMPLANT
CONT PATH 16OZ SNAP LID 3702 (MISCELLANEOUS) ×4 IMPLANT
COVER MAYO STAND STRL (DRAPES) ×4 IMPLANT
COVER TABLE BACK 60X90 (DRAPES) ×8 IMPLANT
COVER TIP SHEARS 8 DVNC (MISCELLANEOUS) ×3 IMPLANT
COVER TIP SHEARS 8MM DA VINCI (MISCELLANEOUS) ×1
DECANTER SPIKE VIAL GLASS SM (MISCELLANEOUS) ×4 IMPLANT
DERMABOND ADVANCED (GAUZE/BANDAGES/DRESSINGS) ×1
DERMABOND ADVANCED .7 DNX12 (GAUZE/BANDAGES/DRESSINGS) ×3 IMPLANT
DEVICE TROCAR PUNCTURE CLOSURE (ENDOMECHANICALS) IMPLANT
DRAPE HUG U DISPOSABLE (DRAPE) ×4 IMPLANT
DRAPE LAPAROTOMY TRNSV 102X78 (DRAPE) ×2 IMPLANT
DRAPE LG THREE QUARTER DISP (DRAPES) ×8 IMPLANT
DRAPE WARM FLUID 44X44 (DRAPE) ×4 IMPLANT
DRIVER LRG NEEDLE DA VINCI (INSTRUMENTS)
DRIVER NDLE LRG DVNC (INSTRUMENTS) IMPLANT
DRSG OPSITE POSTOP 4X10 (GAUZE/BANDAGES/DRESSINGS) ×2 IMPLANT
ELECT LIGASURE SHORT 9 REUSE (ELECTRODE) ×2 IMPLANT
ELECT REM PT RETURN 9FT ADLT (ELECTROSURGICAL) ×4
ELECTRODE REM PT RTRN 9FT ADLT (ELECTROSURGICAL) ×3 IMPLANT
EVACUATOR SMOKE 8.L (FILTER) ×6 IMPLANT
GAUZE VASELINE 3X9 (GAUZE/BANDAGES/DRESSINGS) IMPLANT
GLOVE BIO SURGEON STRL SZ 6.5 (GLOVE) ×10 IMPLANT
GLOVE BIOGEL PI IND STRL 7.0 (GLOVE) ×6 IMPLANT
GLOVE BIOGEL PI INDICATOR 7.0 (GLOVE) ×2
GOWN STRL REIN XL XLG (GOWN DISPOSABLE) ×24 IMPLANT
KIT ACCESSORY DA VINCI DISP (KITS) ×1
KIT ACCESSORY DVNC DISP (KITS) ×3 IMPLANT
LEGGING LITHOTOMY PAIR STRL (DRAPES) ×4 IMPLANT
NEEDLE INSUFFLATION 120MM (ENDOMECHANICALS) ×4 IMPLANT
OCCLUDER COLPOPNEUMO (BALLOONS) ×2 IMPLANT
PACK LAVH (CUSTOM PROCEDURE TRAY) ×4 IMPLANT
PAD PREP 24X48 CUFFED NSTRL (MISCELLANEOUS) ×8 IMPLANT
PLUG CATH AND CAP STER (CATHETERS) ×4 IMPLANT
PROTECTOR NERVE ULNAR (MISCELLANEOUS) ×6 IMPLANT
SCISSORS LAP 5X35 DISP (ENDOMECHANICALS) IMPLANT
SET CYSTO W/LG BORE CLAMP LF (SET/KITS/TRAYS/PACK) ×2 IMPLANT
SET IRRIG TUBING LAPAROSCOPIC (IRRIGATION / IRRIGATOR) ×4 IMPLANT
SOLUTION ELECTROLUBE (MISCELLANEOUS) ×4 IMPLANT
SPONGE LAP 18X18 X RAY DECT (DISPOSABLE) ×14 IMPLANT
STAPLER VISISTAT 35W (STAPLE) ×2 IMPLANT
SURGIFLO W/THROMBIN 8M KIT (HEMOSTASIS) ×2 IMPLANT
SUT VIC AB 0 CT1 18XCR BRD8 (SUTURE) ×2 IMPLANT
SUT VIC AB 0 CT1 27 (SUTURE) ×52
SUT VIC AB 0 CT1 27XBRD ANBCTR (SUTURE) ×13 IMPLANT
SUT VIC AB 0 CT1 36 (SUTURE) ×8 IMPLANT
SUT VIC AB 0 CT1 8-18 (SUTURE) ×8
SUT VICRYL 0 TIES 12 18 (SUTURE) ×2 IMPLANT
SUT VICRYL 0 UR6 27IN ABS (SUTURE) ×4 IMPLANT
SUT VICRYL 4-0 PS2 18IN ABS (SUTURE) ×8 IMPLANT
SUT VLOC 180 0 9IN  GS21 (SUTURE)
SUT VLOC 180 0 9IN GS21 (SUTURE) ×2 IMPLANT
SYR 50ML LL SCALE MARK (SYRINGE) ×4 IMPLANT
SYR TB 1ML 25GX5/8 (SYRINGE) ×2 IMPLANT
SYRINGE 10CC LL (SYRINGE) ×6 IMPLANT
SYSTEM CONVERTIBLE TROCAR (TROCAR) IMPLANT
TIP RUMI ORANGE 6.7MMX12CM (TIP) IMPLANT
TIP UTERINE 5.1X6CM LAV DISP (MISCELLANEOUS) IMPLANT
TIP UTERINE 6.7X10CM GRN DISP (MISCELLANEOUS) IMPLANT
TIP UTERINE 6.7X6CM WHT DISP (MISCELLANEOUS) IMPLANT
TIP UTERINE 6.7X8CM BLUE DISP (MISCELLANEOUS) IMPLANT
TOWEL OR 17X24 6PK STRL BLUE (TOWEL DISPOSABLE) ×12 IMPLANT
TRAY FOLEY CATH 14FR (SET/KITS/TRAYS/PACK) ×2 IMPLANT
TROCAR 12M 150ML BLUNT (TROCAR) IMPLANT
TROCAR DISP BLADELESS 8 DVNC (TROCAR) ×1 IMPLANT
TROCAR DISP BLADELESS 8MM (TROCAR) ×1
TROCAR XCEL 12X100 BLDLESS (ENDOMECHANICALS) ×2 IMPLANT
TROCAR XCEL NON-BLD 5MMX100MML (ENDOMECHANICALS) ×4 IMPLANT
TROCAR Z-THREAD 12X150 (TROCAR) ×4 IMPLANT
TUBING FILTER THERMOFLATOR (ELECTROSURGICAL) ×4 IMPLANT
WARMER LAPAROSCOPE (MISCELLANEOUS) ×4 IMPLANT
WATER STERILE IRR 1000ML POUR (IV SOLUTION) ×12 IMPLANT

## 2012-09-03 NOTE — Anesthesia Postprocedure Evaluation (Signed)
  Anesthesia Post-op Note  Anesthesia Post Note  Patient: [name] [name]  Procedure(s) Performed: Procedure(s) (LRB): SALPINGO OOPHORECTOMY (Bilateral) HYSTERECTOMY ABDOMINAL (N/A)  Anesthesia type: Epidural  Patient location: PACU  Post pain: Pain level controlled  Post assessment: Post-op Vital signs reviewed  Last Vitals:  Filed Vitals:   09/03/12 1245  BP: 104/54  Pulse: 83  Temp: 37.2 C  Resp: 16    Post vital signs: stable  Level of consciousness: awake  Complications: No apparent anesthesia complications

## 2012-09-03 NOTE — Transfer of Care (Signed)
Immediate Anesthesia Transfer of Care Note  Patient: [name] [name]  Procedure(s) Performed: Procedure(s): SALPINGO OOPHORECTOMY (Bilateral) HYSTERECTOMY ABDOMINAL (N/A)  Patient Location: PACU  Anesthesia Type:Epidural  Level of Consciousness: awake, alert  and oriented  Airway & Oxygen Therapy: Patient Spontanous Breathing  Post-op Assessment: Report given to PACU RN and Post -op Vital signs reviewed and stable  Post vital signs: Reviewed and stable  Complications: No apparent anesthesia complications

## 2012-09-03 NOTE — Anesthesia Procedure Notes (Signed)
Epidural Patient location during procedure: OB Start time: 09/03/2012 8:14 AM  Staffing Performed by: anesthesiologist   Preanesthetic Checklist Completed: patient identified, site marked, surgical consent, pre-op evaluation, timeout performed, IV checked, risks and benefits discussed and monitors and equipment checked  Epidural Patient position: sitting Prep: site prepped and draped and DuraPrep Patient monitoring: continuous pulse ox and blood pressure Approach: midline Injection technique: LOR air  Needle:  Needle type: Tuohy  Needle gauge: 17 G Needle length: 9 cm and 9 Needle insertion depth: 4.5 cm Catheter type: closed end flexible Catheter size: 19 Gauge Catheter at skin depth: 9.5 cm Test dose: negative  Assessment Events: blood not aspirated, injection not painful, no injection resistance, negative IV test and no paresthesia  Additional Notes Epidural placed easily on first attempt.  No paresthesia.  Patient tolerated procedure well with no apparent complications.  Jasmine December, MD

## 2012-09-03 NOTE — Brief Op Note (Signed)
09/03/2012  12:31 PM  PATIENT:  [name] [name]  52 y.o. female  PRE-OPERATIVE DIAGNOSIS:  Fibroids, Post Menopausal Bleeding Family history of Ovarian Cancer [address]  POST-OPERATIVE DIAGNOSIS:  Fibroids, Post Menopausal Bleeding Family history of Ovarian Cancer  PROCEDURE:  Exploratory laparotomy, TAHBSO  SURGEON:  Surgeon(s) and Role:    * Jakyiah Briones Cathie Beams, MD - Primary  PHYSICIAN ASSISTANT:   ASSISTANTS: Marlinda Mike, CNM   ANESTHESIA:   epidural Findings: fibroid uterus, nl ovaries, surgical separated tubes, densely adherent bladder, friable tissue EBL:  Total I/O In: 3400 [I.V.:3000; Other:400] Out: 1200 [Urine:550; Blood:650]  BLOOD ADMINISTERED:none  DRAINS: none   LOCAL MEDICATIONS USED:  MARCAINE     SPECIMEN:  Source of Specimen:  uterus w/ cervix, tubes and ovaries  DISPOSITION OF SPECIMEN:  PATHOLOGY  COUNTS:  YES  TOURNIQUET:  * No tourniquets in log *  DICTATION: .Other Dictation: Dictation Number [id number]  PLAN OF CARE: Admit to inpatient   PATIENT DISPOSITION:  PACU - hemodynamically stable.   Delay start of Pharmacological VTE agent (>24hrs) due to surgical blood loss or risk of bleeding: yes

## 2012-09-04 ENCOUNTER — Inpatient Hospital Stay (HOSPITAL_COMMUNITY): Payer: Medicare Other

## 2012-09-04 DIAGNOSIS — Z90710 Acquired absence of both cervix and uterus: Secondary | ICD-10-CM

## 2012-09-04 LAB — CBC
HCT: 28.5 % — ABNORMAL LOW (ref 36.0–46.0)
Hemoglobin: 9.4 g/dL — ABNORMAL LOW (ref 12.0–15.0)
MCH: 27 pg (ref 26.0–34.0)
MCHC: 33 g/dL (ref 30.0–36.0)
MCV: 81.9 fL (ref 78.0–100.0)
Platelets: 287 10*3/uL (ref 150–400)
RBC: 3.48 MIL/uL — ABNORMAL LOW (ref 3.87–5.11)
RDW: 14 % (ref 11.5–15.5)
WBC: 9.6 10*3/uL (ref 4.0–10.5)

## 2012-09-04 LAB — BASIC METABOLIC PANEL
BUN: 6 mg/dL (ref 6–23)
CO2: 26 mEq/L (ref 19–32)
Calcium: 8.3 mg/dL — ABNORMAL LOW (ref 8.4–10.5)
Chloride: 103 mEq/L (ref 96–112)
Creatinine, Ser: 0.61 mg/dL (ref 0.50–1.10)
GFR calc Af Amer: >90 mL/min (ref >90)
GFR calc non Af Amer: >90 mL/min (ref >90)
Glucose, Bld: 204 mg/dL — ABNORMAL HIGH (ref 70–99)
Potassium: 4.1 mEq/L (ref 3.5–5.1)
Sodium: 135 mEq/L (ref 135–145)

## 2012-09-04 MED ORDER — KETOROLAC TROMETHAMINE 30 MG/ML IJ SOLN
30.0000 mg | Freq: Four times a day (QID) | INTRAMUSCULAR | Status: DC | PRN
  Administered 2012-09-04 – 2012-09-06 (×3): 30 mg via INTRAVENOUS
  Filled 2012-09-04 (×3): qty 1

## 2012-09-04 MED ORDER — DEXTROSE IN LACTATED RINGERS 5 % IV SOLN
INTRAVENOUS | Status: DC
  Administered 2012-09-04 – 2012-09-07 (×8): via INTRAVENOUS

## 2012-09-04 NOTE — Progress Notes (Signed)
Patient nauseated and vomiting bile stained emesis with small particles of undigested food.  Dr. Cherly Hensen made aware and orders received.  IV restarted and patient instructed to only have ice chips and sips of clear liquids.

## 2012-09-04 NOTE — Progress Notes (Signed)
Subjective: Patient reports tolerating PO, pain controlled w/ epidural. Foley came out @ 6 am.    Objective: I have reviewed patient's vital signs.  vital signs, intake and output and labs. Filed Vitals:   09/04/12 0609  BP:   Pulse:   Temp:   Resp: 27  BP 112/60. 98.6 P76 I/O last 3 completed shifts: In: 5898.8 [P.O.:630; I.V.:4868.8; Other:400] Out: 4600 [Urine:3950; Blood:650]    Lab Results  Component Value Date   WBC 9.6 09/04/2012   HGB 9.4* 09/04/2012   HCT 28.5* 09/04/2012   MCV 81.9 09/04/2012   PLT 287 09/04/2012   Lab Results  Component Value Date   CREATININE 0.61 09/04/2012    EXAM General: alert, cooperative and no distress Resp: clear to auscultation bilaterally Cardio: regular rate and rhythm, S1, S2 normal, no murmur, click, rub or gallop GI: soft (+) active BS. incision dry/intact/clean Extremities: no edema, redness or tenderness in the calves or thighs Vaginal Bleeding: none  Assessment: s/p Procedure(s):exp lap, SALPINGO OOPHORECTOMY(bilateral) HYSTERECTOMY ABDOMINAL: stable, progressing well, tolerating diet and anemia  Plan: Advance diet Encourage ambulation Advance to PO medication d/c epidural  LOS: 1 day    Houda Brau A, MD 09/04/2012 7:10 AM    09/04/2012, 7:10 AM

## 2012-09-04 NOTE — Anesthesia Postprocedure Evaluation (Signed)
  Anesthesia Post-op Note  Patient: [name] [name]  Procedure(s) Performed: Procedure(s): SALPINGO OOPHORECTOMY (Bilateral) HYSTERECTOMY ABDOMINAL (N/A)  Patient Location: PACU and [facility] Unit  Anesthesia Type:Epidural  Level of Consciousness: awake, alert  and oriented  Airway and Oxygen Therapy: Patient Spontanous Breathing  Post-op Pain: mild  Post-op Assessment: Patient's Cardiovascular Status Stable, Respiratory Function Stable, No signs of Nausea or vomiting, Adequate PO intake and Pain level controlled  Post-op Vital Signs: stable  Complications: No apparent anesthesia complications

## 2012-09-04 NOTE — Op Note (Signed)
NAME:  Lacewell, Elham               ACCOUNT NO.:  [id number]  MEDICAL RECORD NO.:  [id number]  LOCATION:  9317                          FACILITY:  [facility]  PHYSICIAN:  Maxie Better, M.D.DATE OF BIRTH:  October 20, 1960  DATE OF PROCEDURE:  09/03/2012 DATE OF DISCHARGE:                              OPERATIVE REPORT   PREOPERATIVE DIAGNOSES:  Fibroid uterus, postmenopausal bleeding, family history of ovarian cancer.  POSTOPERATIVE DIAGNOSES:  Fibroid uterus, postmenopausal bleeding, family history of ovarian cancer.  PROCEDURE: Exploratory laparotomy, total abdominal hysterectomy, bilateral salpingo-oophorectomy.  ANESTHESIA:  Epidural.  SURGEON:  Maxie Better, MD  ASSISTANT:  Marlinda Mike, CNM  PROCEDURE IN DETAIL:  Under adequate epidural anesthesia, the patient was placed in the supine position.  An indwelling Foley catheter was sterilely placed.  The patient was sterilely prepped and draped in usual fashion.  A 0.25% Marcaine was injected along the previous Pfannenstiel skin incision.  Pfannenstiel skin incision was then made through previous scar, carried down to the rectus fascia.  Rectus fascia opened transversely.  Rectus fascia was then bluntly and sharply dissected off the rectus muscle in superior and inferior fashion.  The rectus muscle was split in midline.  The parietal peritoneum was entered sharply and extended.  The upper abdomen was not explored as the patient had regional anesthesia. The uterus was noted to be containing multiple fibroids, evidence of surgical interruption of the tubes bilaterally was noted.  Ovaries were noted to be normal.  The bladder was densely adherent to the lower uterine segment.  The procedure was started with the round ligaments being bilaterally clamped, suture ligated with 0 Vicryl x2 and cut anteriorly. The anterior leaf of the broad ligament was opened.  The bladder dissection was started to open the bladder flap, however it  was adherent to the point where the retrofilling of the bladder was then performed in order to try to delineate the upper limit of the bladder.  This was done, with some success the bladder was then sharply dissected off the lower uterine segment.  A small piece of the cervical portion of the uterus was adherent to the back wall of the bladder and this was carefully dissected off the bladder. Nonetheless, the posterior leaf of the broad ligament was opened bilaterally.  The utero-ovarian ligament was bilaterally clamped with LigaSure and cauterized and subsequently cut.  The uterine vessels, the one on the right was noted to be bleeding, it was already skeletonized, it was doubly clamped, cut, and suture ligated with 0 Vicryl.  The utero- cardinal ligaments were serially clamped and cut, and suture ligated with 0 Vicryl.  This was continued on the right.  I then went to  the left side of the patient where the same procedure was performed.  The bladder again was sharply dissected and carried down inferiorly.  The uterosacral ligament on the left could be seen, the one on the right was not developed, the one on the left was clamped, cut, and suture ligated with 0 Vicryl suture.  Once the lower limit of the cervix was reached, posteriorly, the vagina was entered  With a scalpeland circumferentially the cervix was sharply dissected off the vaginal cuff  and the uterus and cervix.  The tubes and ovaries were removed.  The vaginal cuff had angle sutures placed on either side with 0 Vicryl.  The vaginal cuff was then oversewn with 0 Vicryl figure-of-eight suture and the cuff was closed in the midline fashion with interrupted 0 Vicryl sutures.  At that point, it was then noted that there was some bleeding on the right that just immediately welled up.  Inspection became limited so self-retaining Balfour retractor was placed.  The bowels were pushed upwardly.  Any bleeders were cauterized or suture  ligated.  The bladder was then retrofilled. The dissection off of the cervical portion off of the bladder was accomplished during this case and at that point, everything was felt to be hemostased and it was then noted again additional bleeding.  It was also noted that even with retraction during the case, all of the tissue was very friable, bled easily, and small bleeders bleeding from areas that were just being manually retracted were bleeding and cauterization was performed throughout.  It was then noted again another episode of active bleeding.  At that point, towards medial aspect of the right pedicles and this was clamped.  The ureter on the right was then identified and seen peristalsing well and was not involved in clamped area and therefore figure- of-eight suture of 0 Vicryl was then placed.  Bleeding on the left of the cuff was also noted which had additional figure-of-eight suture was placed.  The abdomen was then irrigated and suctioned and good hemostasis was noted.  Flo-seal was placed over the vaginal cuff after the packings and  self-retaining retractor were removed.  The parietal peritoneum was closed with 2-0 Vicryl, the rectus fascia was closed with 0 Vicryl x2.  The subcutaneous area which was deep was closed with interrupted 2-0 plain sutures and the skin approximated with Ethicon staples.  SPECIMENS:  Uterus, cervix, tubes, and ovaries sent to Pathology.  ESTIMATED BLOOD LOSS:  700 mL intraoperative fluid, 3400 mL.  URINE OUTPUT:  550 mL.  Sponge and instrument counts x2 was correct.  COMPLICATION:  None.  The patient tolerated procedure well, was transferred to the recovery room in stable condition.     Maxie Better, M.D.     Alba/MEDQ  D:  09/04/2012  T:  09/04/2012  Job:  [id number]

## 2012-09-05 LAB — BASIC METABOLIC PANEL
BUN: 6 mg/dL (ref 6–23)
CO2: 29 mEq/L (ref 19–32)
Calcium: 8.6 mg/dL (ref 8.4–10.5)
Chloride: 106 mEq/L (ref 96–112)
Creatinine, Ser: 0.75 mg/dL (ref 0.50–1.10)
GFR calc Af Amer: >90 mL/min (ref >90)
GFR calc non Af Amer: >90 mL/min (ref >90)
Glucose, Bld: 99 mg/dL (ref 70–99)
Potassium: 3.7 mEq/L (ref 3.5–5.1)
Sodium: 140 mEq/L (ref 135–145)

## 2012-09-05 LAB — PHOSPHORUS: Phosphorus: 2.7 mg/dL (ref 2.3–4.6)

## 2012-09-05 LAB — MAGNESIUM: Magnesium: 1.8 mg/dL (ref 1.5–2.5)

## 2012-09-05 NOTE — Plan of Care (Signed)
Problem: Discharge Progression Outcomes Goal: Activity appropriate for discharge plan Outcome: Completed/Met Date Met:  09/05/12 Patient walks in the hall frequently with the use of a walker and standby assist.Patient moves well with a steady gait,but just seems to be more comfortable with the walker.

## 2012-09-05 NOTE — Progress Notes (Signed)
Subjective: Patient reports nausea, vomiting, incisional pain and no problems voiding.    Objective: I have reviewed patient's vital signs.  vital signs, intake and output and labs. Filed Vitals:   09/05/12 0517  BP: 124/62  Pulse: 78  Temp: 97.8 F (36.6 C)  Resp: 20   I/O last 3 completed shifts: In: 3509.2 [P.O.:630; I.V.:2879.2] Out: 5050 [Urine:4350; Emesis/NG output:700] Total I/O In: -  Out: 200 [Urine:200]  Lab Results  Component Value Date   WBC 9.6 09/04/2012   HGB 9.4* 09/04/2012   HCT 28.5* 09/04/2012   MCV 81.9 09/04/2012   PLT 287 09/04/2012   Lab Results  Component Value Date   CREATININE 0.61 09/04/2012    EXAM General: alert, cooperative and no distress Resp: clear to auscultation bilaterally Cardio: regular rate and rhythm, S1, S2 normal, no murmur, click, rub or gallop GI: incision: clean, dry and intact and soft mild distended faint sounds Extremities: no edema, redness or tenderness in the calves or thighs Vaginal Bleeding: none  Assessment: s/p Procedure(s):exp laparotomy under epidural anesthesia SALPINGO OOPHORECTOMY,BILATERAL HYSTERECTOMY ABDOMINAL: anemia and ileus poss SBO per KUB/upright  Plan: NPO IVF Ambulate Incentive spirometry Check magnesium, phosphate level k PAD  LOS: 2 days    [name] [name] A, MD 09/05/2012 8:54 AM    09/05/2012, 8:54 AM

## 2012-09-05 NOTE — Plan of Care (Signed)
Problem: Discharge Progression Outcomes Goal: Pain controlled with appropriate interventions Outcome: Completed/Met Date Met:  09/05/12 Good pain control with IV Toradol Goal: Hemodynamically stable Outcome: Completed/Met Date Met:  09/05/12 VSS at this time

## 2012-09-05 NOTE — Progress Notes (Signed)
Auscultated the patients abd this PM at 1930 and her right and left upper quadrant bowel sounds are very audible,as well as her RLQ,LLQ is audible but faint and she has passed some flatus.

## 2012-09-06 ENCOUNTER — Inpatient Hospital Stay (HOSPITAL_COMMUNITY): Payer: Medicare Other

## 2012-09-06 ENCOUNTER — Encounter (HOSPITAL_COMMUNITY): Payer: Self-pay | Admitting: Obstetrics and Gynecology

## 2012-09-06 MED ORDER — PANTOPRAZOLE SODIUM 40 MG IV SOLR
40.0000 mg | INTRAVENOUS | Status: DC
  Administered 2012-09-06: 40 mg via INTRAVENOUS
  Filled 2012-09-06 (×3): qty 40

## 2012-09-06 MED ORDER — BISACODYL 10 MG RE SUPP
10.0000 mg | Freq: Once | RECTAL | Status: DC
  Filled 2012-09-06: qty 1

## 2012-09-06 MED ORDER — SIMETHICONE 80 MG PO CHEW: 80.0000 mg | CHEWABLE_TABLET | Freq: Three times a day (TID) | ORAL | Status: DC | PRN

## 2012-09-06 NOTE — Progress Notes (Signed)
Subjective: Patient reports + flatus and no problems voiding.  C/o hunger. notes symptoms like needing to have BM  Objective: I have reviewed patient's vital signs.  vital signs, intake and output, medications, labs and radiology results. Filed Vitals:   09/06/12 0525  BP: 135/79  Pulse: 65  Temp: 97.9 F (36.6 C)  Resp: 20   I/O last 3 completed shifts: In: 4585.4 [I.V.:4585.4] Out: 3400 [Urine:3400] Total I/O In: -  Out: 550 [Urine:550]  Lab Results  Component Value Date   WBC 9.6 09/04/2012   HGB 9.4* 09/04/2012   HCT 28.5* 09/04/2012   MCV 81.9 09/04/2012   PLT 287 09/04/2012   Lab Results  Component Value Date   CREATININE 0.75 09/05/2012   Xray: KUB unchanged w/r to SBO EXAM General: alert, cooperative and no distress Resp: clear to auscultation bilaterally Cardio: regular rate and rhythm, S1, S2 normal, no murmur, click, rub or gallop GI: incision: clean, dry and intact and dressing in place soft (+) BS sl distended Extremities: no edema, redness or tenderness in the calves or thighs Vaginal Bleeding: none  Assessment: s/p Procedure(s): exp laparotomy SALPINGO OOPHORECTOMY HYSTERECTOMY ABDOMINAL: stable and small bowel obstruction pr radiology  Plan: Encourage ambulation Clear liquids dulcolax suppository Advance diet as tol  LOS: 3 days    Philipp Callegari A, MD 09/06/2012 11:05 AM    09/06/2012, 11:05 AM

## 2012-09-06 NOTE — Progress Notes (Signed)
Ur chart review completed.  

## 2012-09-07 MED ORDER — IBUPROFEN 800 MG PO TABS: 800.0000 mg | ORAL_TABLET | Freq: Three times a day (TID) | ORAL | Status: DC | PRN

## 2012-09-07 MED ORDER — OXYCODONE-ACETAMINOPHEN 5-325 MG PO TABS: 1.0000 | ORAL_TABLET | ORAL | Status: DC | PRN

## 2012-09-07 NOTE — Progress Notes (Signed)
Subjective: Patient reports tolerating PO, + flatus, + BM and no problems voiding.    Objective: I have reviewed patient's vital signs.  vital signs, intake and output, labs and pathology. Filed Vitals:   09/07/12 0528  BP: 131/68  Pulse: 69  Temp: 98.2 F (36.8 C)  Resp: 18   I/O last 3 completed shifts: In: 5029.8 [P.O.:1580; I.V.:3449.8] Out: 3900 [Urine:3900]    Lab Results  Component Value Date   WBC 9.6 09/04/2012   HGB 9.4* 09/04/2012   HCT 28.5* 09/04/2012   MCV 81.9 09/04/2012   PLT 287 09/04/2012   Lab Results  Component Value Date   CREATININE 0.75 09/05/2012    EXAM General: alert, cooperative and no distress Resp: clear to auscultation bilaterally Cardio: regular rate and rhythm, S1, S2 normal, no murmur, click, rub or gallop GI: soft, non-tender; bowel sounds normal; no masses,  no organomegaly and incision: clean, dry, intact and dressing remains Extremities: no edema, redness or tenderness in the calves or thighs Vaginal Bleeding: none  Assessment: s/p Procedure(s):Bilateral SALPINGO OOPHORECTOMY, total HYSTERECTOMY ABDOMINAL: stable, progressing well and tolerating diet Bowel obstruction resolved Plan: Advance diet Encourage ambulation Discontinue IV fluids Discharge home F/u Friday for staple removal. Postop f/u 4 weeks. D/c instructions reviewed Script given: percocet, motrin   LOS: 4 days    Nickole Adamek A, MD 09/07/2012 7:43 AM    09/07/2012, 7:43 AM

## 2012-09-07 NOTE — Discharge Summary (Signed)
Physician Discharge Summary  Patient ID: SHAYLEIGH BOULDIN MRN: [id number] DOB/AGE: Oct 03, 1960 52 y.o.  Admit date: 09/03/2012 Discharge date: 09/07/2012  Admission Diagnoses: fibroid uterus, postmenopausal bleeding, family history of ovarian cancer  Discharge Diagnoses: fibroid uterus, postmenopausal bleeding, family history of ovarian cancer, small bowel obstruction resolved, postoperative anemia  Active Problem: Rheumatoid arthritis  Discharged Condition: stable  Hospital Course: pt underwent TAH BSO 5/30. Postop course complicated by nausea/vomiting starting late pm of POD #1. KUB and upright study done showed SBO. Pt was placed NPO until passing flatus whereupon diet was re-instituted. Pt had BM prior to discharge.   Consults: None  Significant Diagnostic Studies: radiology: KUB: and upright  Treatments: surgery: exploratory laparotomy, TAH BSO BMET    Component Value Date/Time   NA 140 09/05/2012 0939   K 3.7 09/05/2012 0939   CL 106 09/05/2012 0939   CO2 29 09/05/2012 0939   GLUCOSE 99 09/05/2012 0939   BUN 6 09/05/2012 0939   CREATININE 0.75 09/05/2012 0939   CALCIUM 8.6 09/05/2012 0939   GFRNONAA >90 09/05/2012 0939   GFRAA >90 09/05/2012 0939   CBC    Component Value Date/Time   WBC 9.6 09/04/2012 0530   RBC 3.48* 09/04/2012 0530   HGB 9.4* 09/04/2012 0530   HCT 28.5* 09/04/2012 0530   PLT 287 09/04/2012 0530   MCV 81.9 09/04/2012 0530   MCH 27.0 09/04/2012 0530   MCHC 33.0 09/04/2012 0530   RDW 14.0 09/04/2012 0530      Discharge Exam: Blood pressure 131/68, pulse 69, temperature 98.2 F (36.8 C), temperature source Oral, resp. rate 18, height 5' 2.5" (1.588 m), weight 74.753 kg (164 lb 12.8 oz), SpO2 100.00%. General appearance: alert, cooperative and no distress Back: no tenderness to percussion or palpation Resp: clear to auscultation bilaterally Cardio: regular rate and rhythm, S1, S2 normal, no murmur, click, rub or gallop GI: soft, non-tender; bowel sounds normal; no  masses,  no organomegaly Pelvic: deferred Extremities: no edema, redness or tenderness in the calves or thighs Skin: Skin color, texture, turgor normal. No rashes or lesions or no edema Incision/Wound:primary dressing dry/clean/intact  Disposition: 01-Home or Self Care  Discharge Orders   Future Orders Complete By Expires     Discharge patient  As directed         Medication List    TAKE these medications       acetaminophen 500 MG tablet  Commonly known as:  TYLENOL  Take 1,000 mg by mouth every 6 (six) hours as needed for pain.     ibuprofen 800 MG tablet  Commonly known as:  ADVIL,MOTRIN  Take 1 tablet (800 mg total) by mouth every 8 (eight) hours as needed (mild pain).     norethindrone 5 MG tablet  Commonly known as:  AYGESTIN  Take 5 mg by mouth daily.     oxyCODONE-acetaminophen 5-325 MG per tablet  Commonly known as:  PERCOCET/ROXICET  Take 1-2 tablets by mouth every 4 (four) hours as needed.           Follow-up Information   Follow up with Kyliana Standen A, MD In 4 weeks.   Contact information:   [address] [place] [address] [phone]     Staple and dressing removal in office on friday  Signed: Jereline Ticer A 09/07/2012, 7:46 AM

## 2012-09-07 NOTE — Progress Notes (Signed)
Pt is discharged in the care of friend . Downstairs per ambulatory, Denies any pain or discomfort. Spirits are good. Abdominal incision is clean and dry, with honeycomb in place. Denies vaginal bleeding.Discharge instructions with Rx were given with good understanding.

## 2014-02-10 IMAGING — CR DG ABDOMEN ACUTE W/ 1V CHEST
3 series · 3 of 3 positions shown · non-contrast
Comparison: 01/01/2005

CLINICAL DATA: Nausea, vomiting.

ACUTE ABDOMEN SERIES (ABDOMEN 2 VIEW & CHEST 1 VIEW)

[view not recorded (1 of 3)]
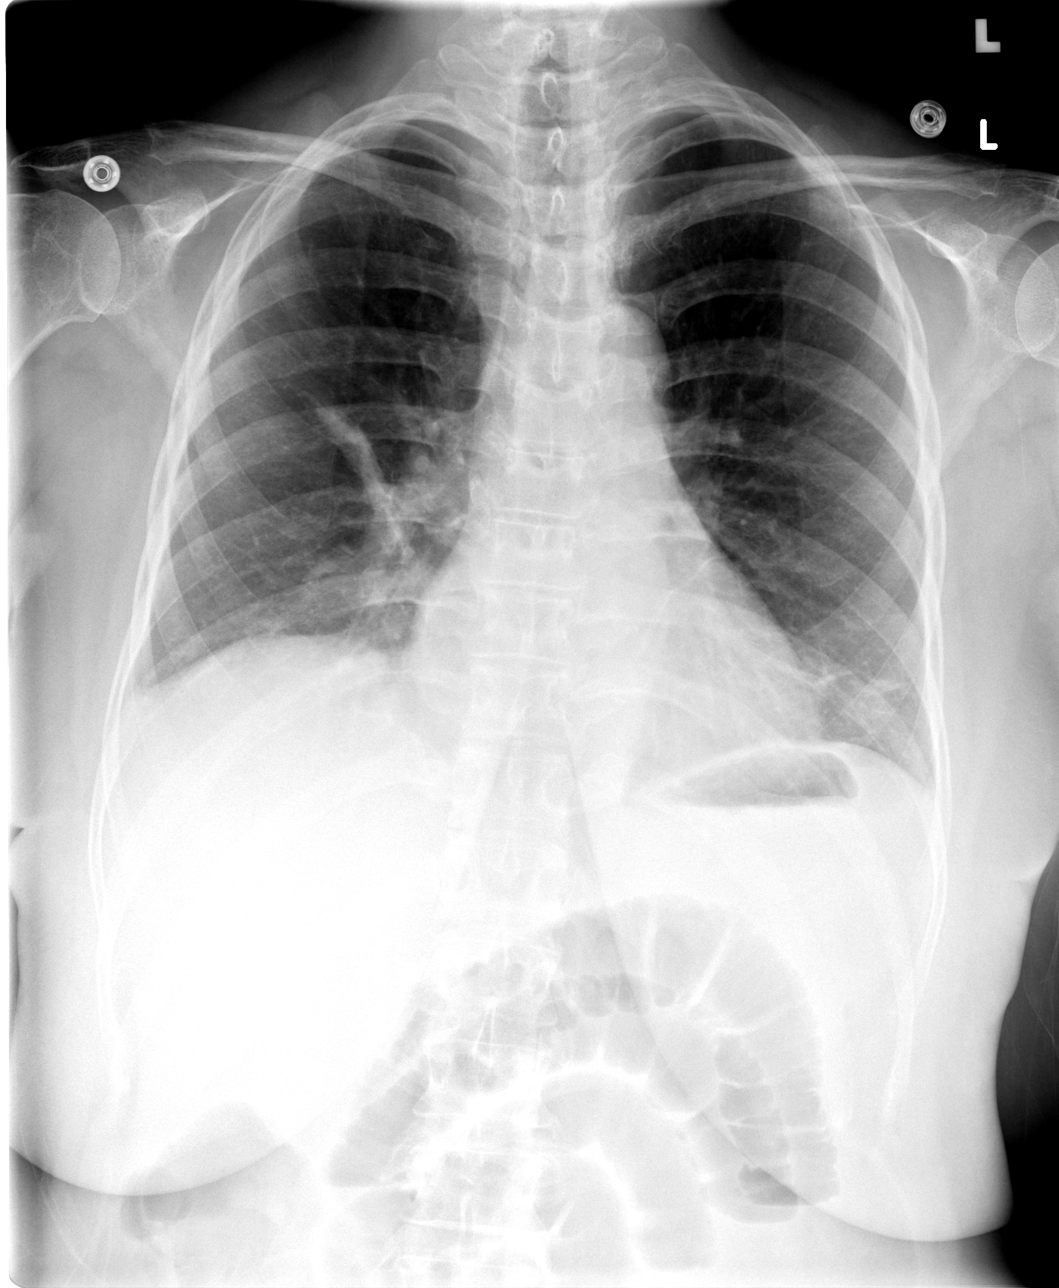

[view not recorded (2 of 3)]
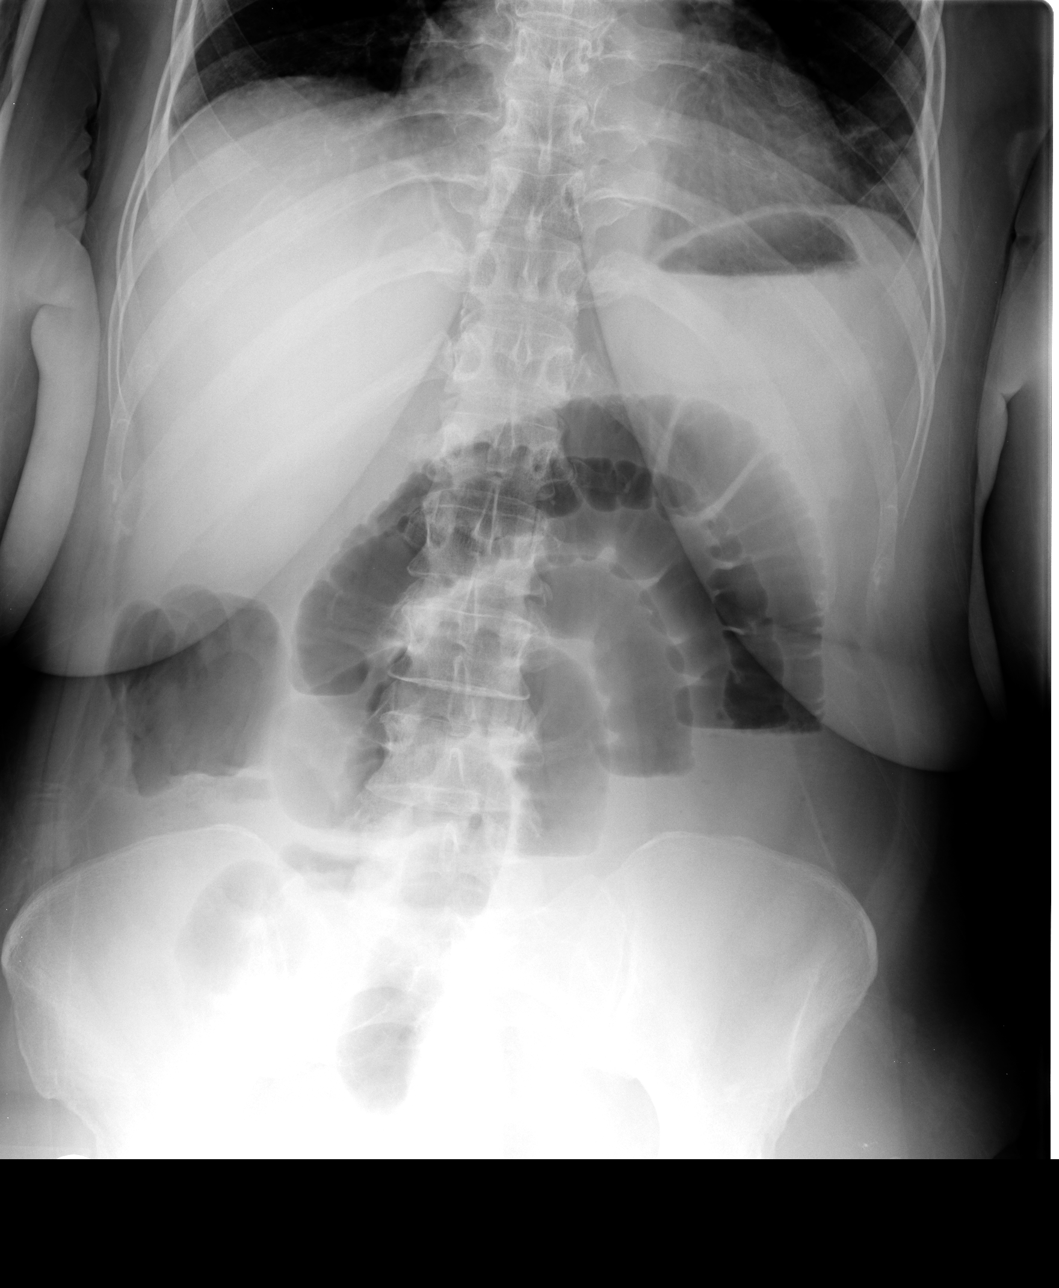

[view not recorded (3 of 3)]
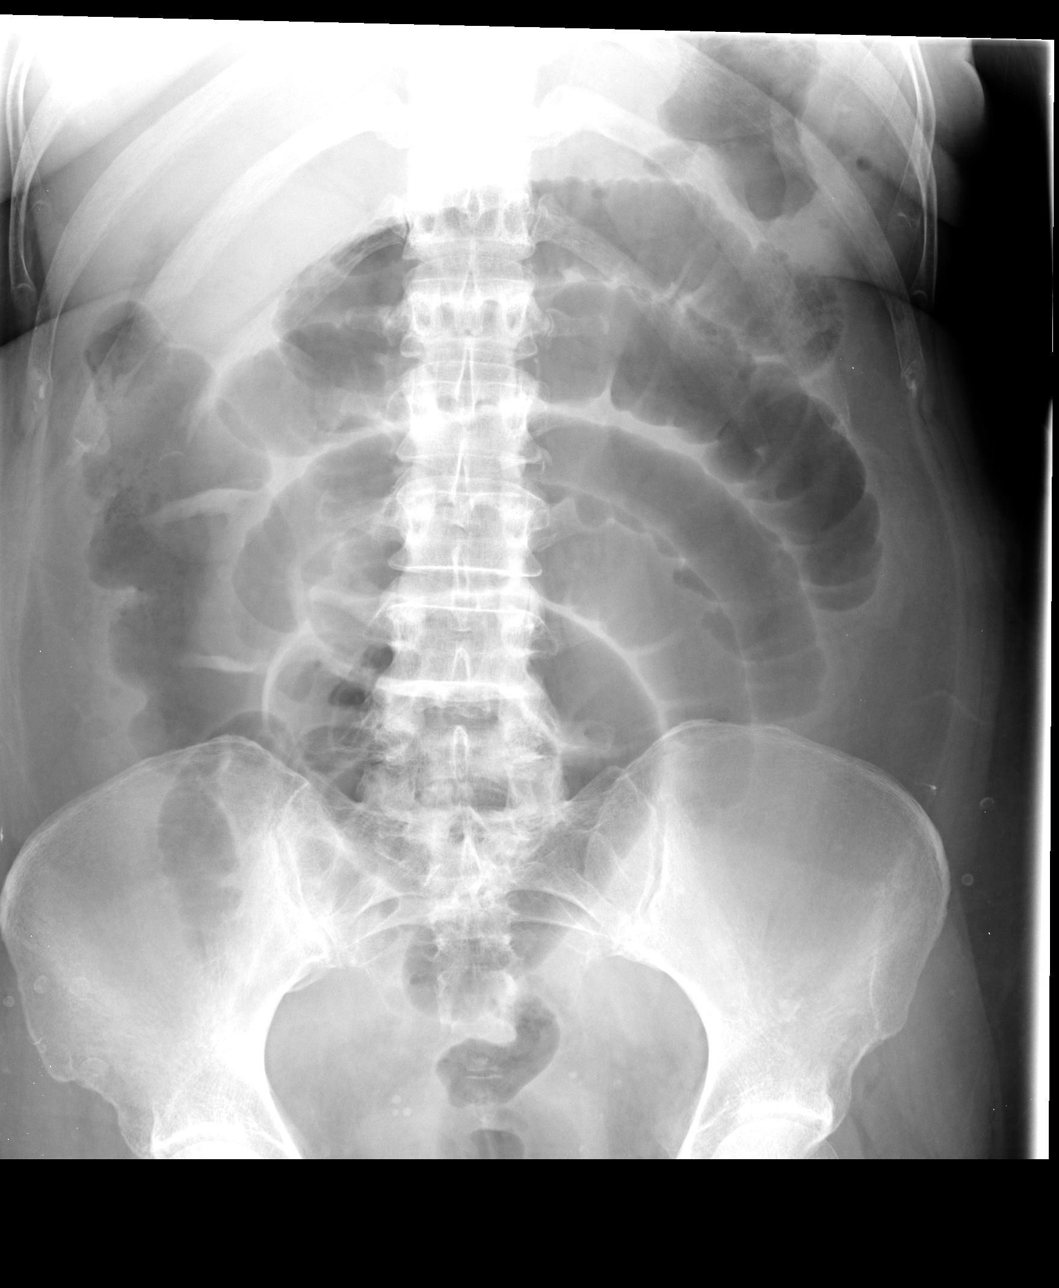

[3 of 3 positions shown; findings below may reference images not displayed]

FINDINGS: Dilated small bowel loops within the abdomen or pelvis
with air-fluid levels compatible with small bowel obstruction.  Gas
within nondistended colon.  No free air.  No organomegaly.  No
suspicious calcifications.

Areas of atelectasis in the lungs bilaterally.  Heart is normal
size.  No effusions or acute bony abnormality.
IMPRESSION: Dilated small bowel loops with air-fluid levels concerning for
small bowel obstruction.

Areas of atelectasis in the lungs bilaterally.

## 2014-02-12 IMAGING — CR DG ABDOMEN 2V
2 series · 2 of 2 positions shown · non-contrast
Comparison: Abdomen films of 09/04/2012

CLINICAL DATA: Small bowel obstruction, follow-up

ABDOMEN - 2 VIEW

[view not recorded (1 of 2)]
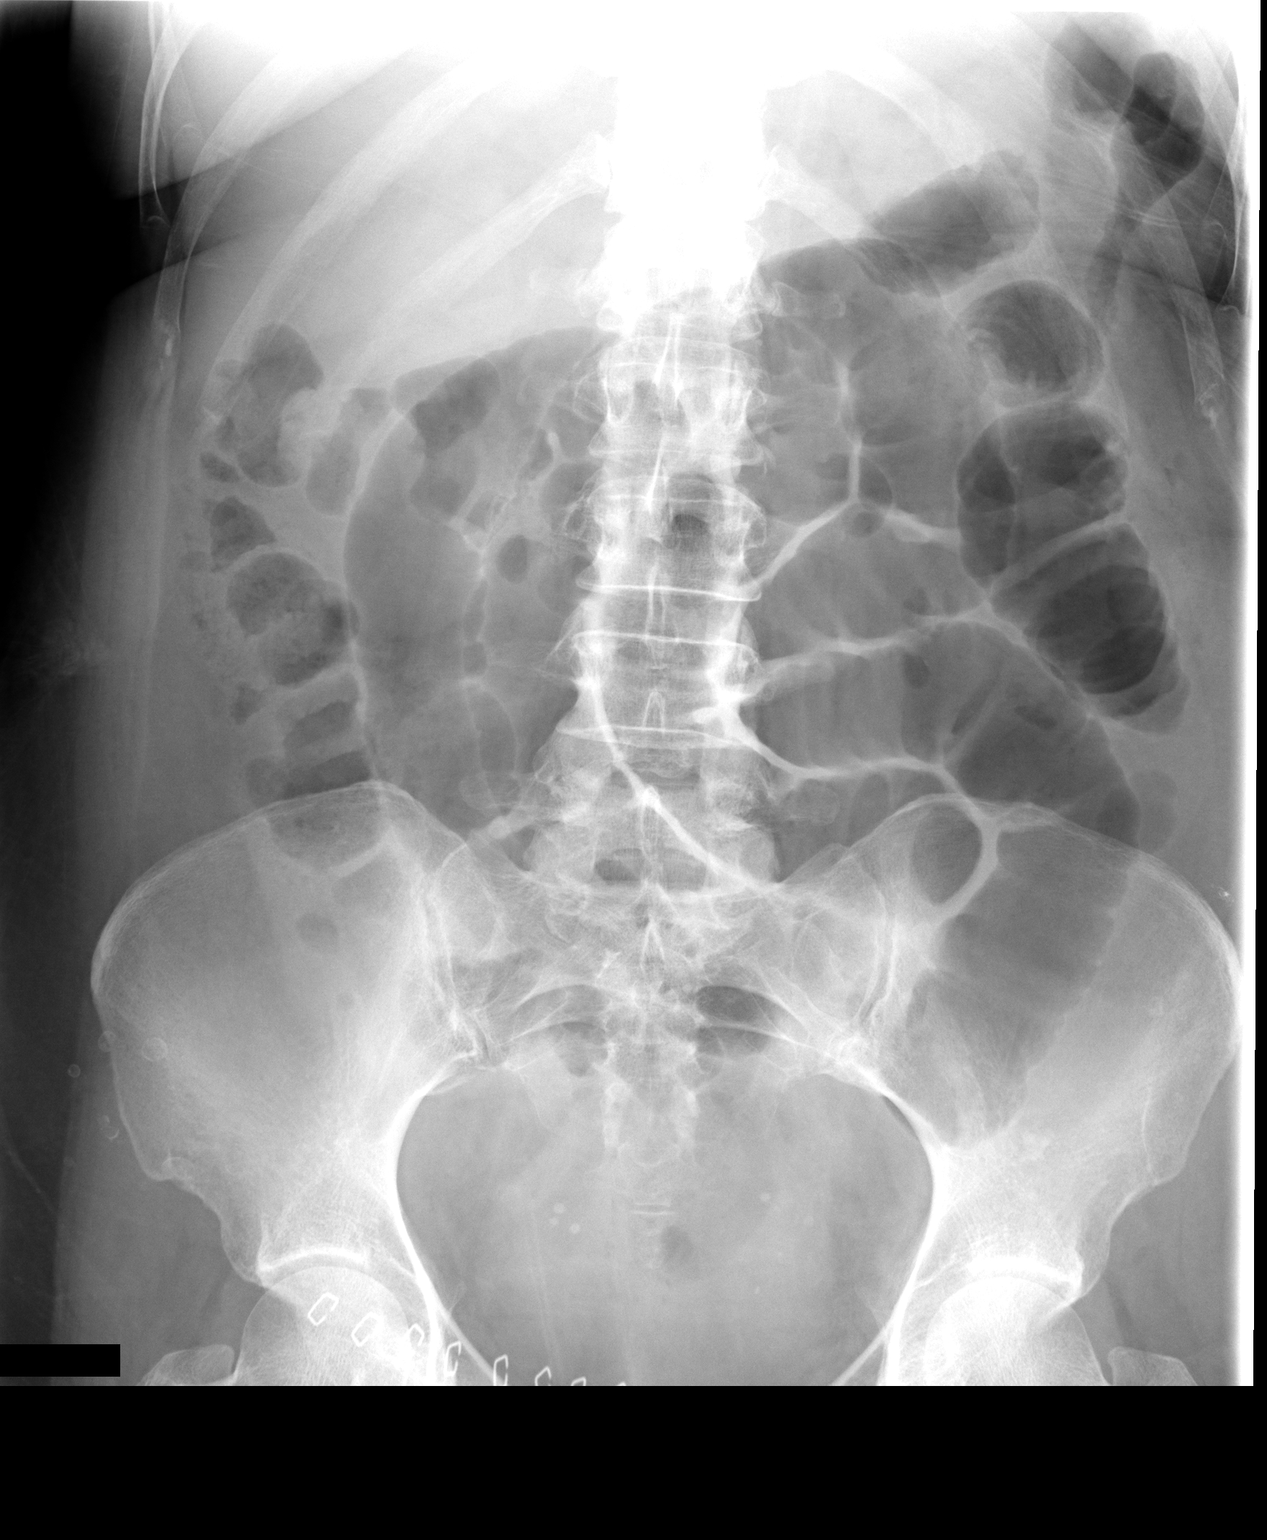

[view not recorded (2 of 2)]
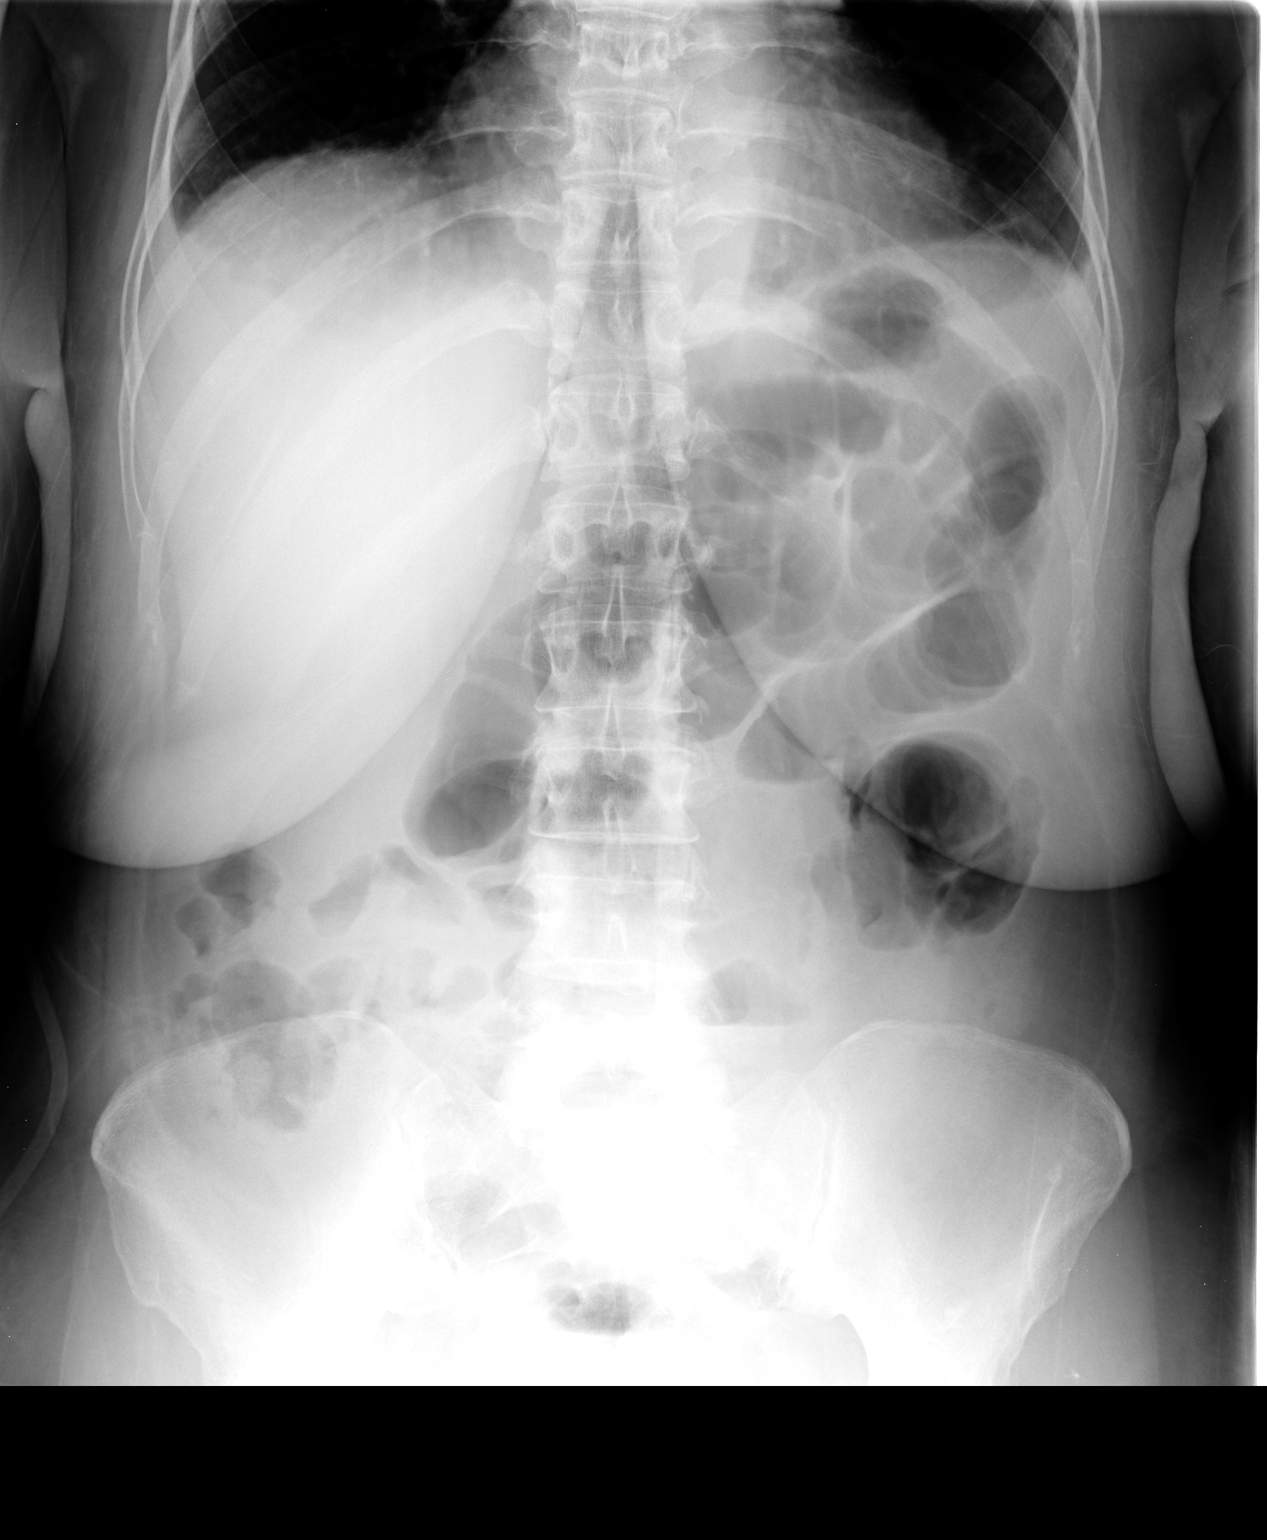

[2 of 2 positions shown; findings below may reference images not displayed]

FINDINGS: There is little change in the degree of small bowel
obstruction with dilated small bowel measuring up to 4 cm in
diameter.  Only a small amount of colonic bowel gas is seen.  No
free air is noted on the erect view.  Surgical staples overlie the
lower pelvis.
IMPRESSION: No significant change in persistent small bowel obstruction.  No
free air.

## 2015-09-28 ENCOUNTER — Encounter: Payer: Self-pay | Admitting: Genetic Counselor

## 2016-01-09 ENCOUNTER — Ambulatory Visit: Payer: Self-pay | Admitting: Rheumatology

## 2016-02-04 ENCOUNTER — Telehealth: Payer: Self-pay | Admitting: Rheumatology

## 2016-02-04 NOTE — Telephone Encounter (Signed)
Pt has questions about her bill, states she shouldn't  Have a bill for $117.11. Pt states the correct billing address for her [facility] ins should be [facility] [employer] [facility] [address] [place] [address]. Any questions please call pt's cell number

## 2016-03-05 ENCOUNTER — Other Ambulatory Visit: Payer: Self-pay | Admitting: Rheumatology

## 2016-03-05 NOTE — Telephone Encounter (Signed)
12/27/15 labs WNL  Last visit 01/02/16 TB neg 03/2015 Next visit 06/03/16 Ok to refill per Dr Estanislado Pandy

## 2016-03-11 NOTE — Telephone Encounter (Signed)
OPENED IN ERROR

## 2016-03-27 ENCOUNTER — Other Ambulatory Visit: Payer: Self-pay | Admitting: Rheumatology

## 2016-03-27 LAB — COMPLETE METABOLIC PANEL WITH GFR
ALT: 12 U/L (ref 6–29)
AST: 18 U/L (ref 10–35)
Albumin: 3.7 g/dL (ref 3.6–5.1)
Alkaline Phosphatase: 89 U/L (ref 33–130)
BUN: 13 mg/dL (ref 7–25)
CO2: 25 mmol/L (ref 20–31)
Calcium: 9.1 mg/dL (ref 8.6–10.4)
Chloride: 105 mmol/L (ref 98–110)
Creat: 0.8 mg/dL (ref 0.50–1.05)
GFR, Est African American: >89 mL/min (ref >=60)
GFR, Est Non African American: 84 mL/min (ref >=60)
Glucose, Bld: 96 mg/dL (ref 65–99)
Potassium: 4.1 mmol/L (ref 3.5–5.3)
Sodium: 140 mmol/L (ref 135–146)
Total Bilirubin: 0.6 mg/dL (ref 0.2–1.2)
Total Protein: 6.5 g/dL (ref 6.1–8.1)

## 2016-03-27 LAB — CBC WITH DIFFERENTIAL/PLATELET
Basophils Absolute: 65 cells/uL (ref 0–200)
Basophils Relative: 1 %
Eosinophils Absolute: 130 cells/uL (ref 15–500)
Eosinophils Relative: 2 %
HCT: 40.5 % (ref 35.0–45.0)
Hemoglobin: 13.3 g/dL (ref 11.7–15.5)
Lymphocytes Relative: 38 %
Lymphs Abs: 2470 cells/uL (ref 850–3900)
MCH: 28.7 pg (ref 27.0–33.0)
MCHC: 32.8 g/dL (ref 32.0–36.0)
MCV: 87.3 fL (ref 80.0–100.0)
MPV: 10.6 fL (ref 7.5–12.5)
Monocytes Absolute: 390 cells/uL (ref 200–950)
Monocytes Relative: 6 %
Neutro Abs: 3445 cells/uL (ref 1500–7800)
Neutrophils Relative %: 53 %
Platelets: 276 10*3/uL (ref 140–400)
RBC: 4.64 MIL/uL (ref 3.80–5.10)
RDW: 15.9 % — ABNORMAL HIGH (ref 11.0–15.0)
WBC: 6.5 10*3/uL (ref 3.8–10.8)

## 2016-03-27 NOTE — Progress Notes (Signed)
Labs normal.

## 2016-03-29 LAB — QUANTIFERON TB GOLD ASSAY (BLOOD)
Interferon Gamma Release Assay: NEGATIVE
Mitogen-Nil: 2.55 IU/mL
Quantiferon Nil Value: 0.02 IU/mL
Quantiferon Tb Ag Minus Nil Value: 0 IU/mL

## 2016-04-06 NOTE — Progress Notes (Signed)
TB: Negative

## 2016-04-08 ENCOUNTER — Telehealth: Payer: Self-pay | Admitting: Radiology

## 2016-04-08 NOTE — Telephone Encounter (Signed)
Called patient to advise  °

## 2016-04-08 NOTE — Telephone Encounter (Signed)
-----   Message from Bo Merino, MD sent at 04/06/2016  8:06 PM EST ----- TB: Negative

## 2016-04-10 ENCOUNTER — Other Ambulatory Visit: Payer: Self-pay | Admitting: Rheumatology

## 2016-04-10 NOTE — Telephone Encounter (Signed)
ok 

## 2016-04-10 NOTE — Telephone Encounter (Signed)
Last Visit: 01/02/16 Next Visit: 06/03/16 Labs: 03/27/16 WNL TB Gold: 03/27/16 Neg  Okay to refill Enbrel?

## 2016-04-25 ENCOUNTER — Telehealth: Payer: Self-pay | Admitting: Rheumatology

## 2016-04-25 NOTE — Telephone Encounter (Signed)
Can I do this, or do I need a release signed ?

## 2016-04-25 NOTE — Telephone Encounter (Signed)
Can not do this unless she signs a release called her to advise. She can come by and pick up or she can sign up for My chart. I have told her to come by and pick this up she states she will come by on Monday

## 2016-04-25 NOTE — Telephone Encounter (Signed)
Patient is requesting her TB gold results be faxed to [facility]. Fax# [phone]. She states she has training there next week and needs this sent. She is also requesting a phone call after it has been sent.

## 2016-05-20 ENCOUNTER — Telehealth: Payer: Self-pay | Admitting: Rheumatology

## 2016-05-20 ENCOUNTER — Ambulatory Visit: Payer: Federal, State, Local not specified - PPO | Admitting: Rheumatology

## 2016-05-20 NOTE — Telephone Encounter (Signed)
Attempted to contact the patient and left message for patient to call the office.  

## 2016-05-20 NOTE — Telephone Encounter (Signed)
Patient states she is holding her MTX and Enbrel until she is better. Patient states she has been around coworkers who are coughing and now she is having those symptoms. Patient would like to know what she can take. Patient advised to contact her PCP. Patient advised to resume her MTX and Enbrel after she is feels well again. Patient verbalized understanding.

## 2016-05-20 NOTE — Telephone Encounter (Signed)
Patient called wanting to know if she can take Tylenol cold medicine or tylenol flu.  Cb#[phone].  Thank you

## 2016-05-27 DIAGNOSIS — Z79899 Other long term (current) drug therapy: Secondary | ICD-10-CM | POA: Insufficient documentation

## 2016-05-27 DIAGNOSIS — M19072 Primary osteoarthritis, left ankle and foot: Secondary | ICD-10-CM

## 2016-05-27 DIAGNOSIS — M19071 Primary osteoarthritis, right ankle and foot: Secondary | ICD-10-CM | POA: Insufficient documentation

## 2016-05-27 DIAGNOSIS — M47812 Spondylosis without myelopathy or radiculopathy, cervical region: Secondary | ICD-10-CM | POA: Insufficient documentation

## 2016-05-27 DIAGNOSIS — M05772 Rheumatoid arthritis with rheumatoid factor of left ankle and foot without organ or systems involvement: Secondary | ICD-10-CM

## 2016-05-27 DIAGNOSIS — M05771 Rheumatoid arthritis with rheumatoid factor of right ankle and foot without organ or systems involvement: Secondary | ICD-10-CM | POA: Insufficient documentation

## 2016-05-27 DIAGNOSIS — M7062 Trochanteric bursitis, left hip: Secondary | ICD-10-CM | POA: Insufficient documentation

## 2016-05-27 DIAGNOSIS — R768 Other specified abnormal immunological findings in serum: Secondary | ICD-10-CM | POA: Insufficient documentation

## 2016-05-27 DIAGNOSIS — M7712 Lateral epicondylitis, left elbow: Secondary | ICD-10-CM | POA: Insufficient documentation

## 2016-05-27 DIAGNOSIS — M0579 Rheumatoid arthritis with rheumatoid factor of multiple sites without organ or systems involvement: Secondary | ICD-10-CM | POA: Insufficient documentation

## 2016-05-27 DIAGNOSIS — M85852 Other specified disorders of bone density and structure, left thigh: Secondary | ICD-10-CM | POA: Insufficient documentation

## 2016-05-27 NOTE — Progress Notes (Deleted)
Office Visit Note  Patient: [name] [name]             Date of Birth: 1960-05-21           MRN: [id number]             PCP: Osborne Casco, MD Referring: Kelton Pillar, MD Visit Date: 06/03/2016 Occupation: '@GUAROCC'$ @    Subjective:  No chief complaint on file.   History of Present Illness: [name] [name] is a 56 y.o. female ***   Activities of Daily Living:  Patient reports morning stiffness for *** {minute/hour:19697}.   Patient {ACTIONS;DENIES/REPORTS:21021675::"Denies"} nocturnal pain.  Difficulty dressing/grooming: {ACTIONS;DENIES/REPORTS:21021675::"Denies"} Difficulty climbing stairs: {ACTIONS;DENIES/REPORTS:21021675::"Denies"} Difficulty getting out of chair: {ACTIONS;DENIES/REPORTS:21021675::"Denies"} Difficulty using hands for taps, buttons, cutlery, and/or writing: {ACTIONS;DENIES/REPORTS:21021675::"Denies"}   No Rheumatology ROS completed.   PMFS History:  Patient Active Problem List   Diagnosis Date Noted  . Rheumatoid arthritis involving multiple sites with positive rheumatoid factor ([place]) 05/27/2016  . ANA positive 05/27/2016  . Lateral epicondylitis, left elbow 05/27/2016  . Trochanteric bursitis of left hip 05/27/2016  . High risk medication use 05/27/2016  . DJD (degenerative joint disease), cervical 05/27/2016  . S/P TAH-BSO (5/30) 09/04/2012    Past Medical History:  Diagnosis Date  . Arthritis    rheumotroid  . Complication of anesthesia    "irregular heartbeat" during neck surgery    No family history on file. Past Surgical History:  Procedure Laterality Date  . ABDOMINAL HYSTERECTOMY N/A 09/03/2012   Procedure: HYSTERECTOMY ABDOMINAL;  Surgeon: Marvene Staff, MD;  Location: [facility];  Service: Gynecology;  Laterality: N/A;  . CERVICAL SPINE SURGERY    . FOOT SURGERY Bilateral   . MEDIAL PARTIAL KNEE REPLACEMENT Bilateral   . SALPINGOOPHORECTOMY Bilateral 09/03/2012   Procedure: SALPINGO OOPHORECTOMY;  Surgeon: Marvene Staff, MD;  Location: [facility];  Service: Gynecology;  Laterality: Bilateral;  . TONSILLECTOMY     Social History   Social History Narrative  . No narrative on file     Objective: Vital Signs: There were no vitals taken for this visit.   Physical Exam   Musculoskeletal Exam: ***  CDAI Exam: No CDAI exam completed.    Investigation: Findings:  03/15/2015 X-rays of the bilateral hands, 2 views, obtained today showed severe intercarpal joint, radiocarpal joint, and MCP joint narrowing and all PIP narrowing.  No erosive changes.  There was no interval change from February 2013.  X-rays of the bilateral feet, 2 views, showed bilateral first MTP fused with plates and screws.  All other MTPs appear to have some post surgical changes.  They are all subluxed with erosive changes.  There is severe tibial tarsal joint narrowing with no interval change from February 2013.     Orders Only on 03/27/2016  Component Date Value Ref Range Status  . Interferon Gamma Release Assay 03/27/2016 NEGATIVE  NEGATIVE Final  . Quantiferon Nil Value 03/27/2016 0.02  IU/mL Final  . Mitogen-Nil 03/27/2016 2.55  IU/mL Final  . Quantiferon Tb Ag Minus Nil Value 03/27/2016 0.00  IU/mL Final   Comment:   The Nil tube value is used to determine if the patient has a preexisting immune response which could cause a false-positive reading on the test. In order for a test to be valid, the Nil tube must have a value of less than or equal to 8.0 IU/mL.   The mitogen control tube is used to assure the patient has a healthy immune status and also serves as  a control for correct blood handling and incubation. It is used to detect false-negative readings. The mitogen tube must have a gamma interferon value of greater than or equal to 0.5 IU/mL higher than the value of the Nil tube.   The TB antigen tube is coated with the M. tuberculosis specific antigens. For a test to be considered positive, the TB antigen  tube value minus the Nil tube value must be greater than or equal to 0.35 IU/mL.   For additional information, please refer to http://education.[facility].com/faq/QFT (This link is being provided for informational/educational purposes only.)   Orders Only on 03/27/2016  Component Date Value Ref Range Status  . Sodium 03/27/2016 140  135 - 146 mmol/L Final  . Potassium 03/27/2016 4.1  3.5 - 5.3 mmol/L Final  . Chloride 03/27/2016 105  98 - 110 mmol/L Final  . CO2 03/27/2016 25  20 - 31 mmol/L Final  . Glucose, Bld 03/27/2016 96  65 - 99 mg/dL Final  . BUN 03/27/2016 13  7 - 25 mg/dL Final  . Creat 03/27/2016 0.80  0.50 - 1.05 mg/dL Final   Comment:   For patients > or = 54 years of age: The upper reference limit for Creatinine is approximately 13% higher for people identified as African-American.     . Total Bilirubin 03/27/2016 0.6  0.2 - 1.2 mg/dL Final  . Alkaline Phosphatase 03/27/2016 89  33 - 130 U/L Final  . AST 03/27/2016 18  10 - 35 U/L Final  . ALT 03/27/2016 12  6 - 29 U/L Final  . Total Protein 03/27/2016 6.5  6.1 - 8.1 g/dL Final  . Albumin 03/27/2016 3.7  3.6 - 5.1 g/dL Final  . Calcium 03/27/2016 9.1  8.6 - 10.4 mg/dL Final  . GFR, Est African American 03/27/2016 >89  >=60 mL/min Final  . GFR, Est Non African American 03/27/2016 84  >=60 mL/min Final  . WBC 03/27/2016 6.5  3.8 - 10.8 K/uL Final  . RBC 03/27/2016 4.64  3.80 - 5.10 MIL/uL Final  . Hemoglobin 03/27/2016 13.3  11.7 - 15.5 g/dL Final  . HCT 03/27/2016 40.5  35.0 - 45.0 % Final  . MCV 03/27/2016 87.3  80.0 - 100.0 fL Final  . MCH 03/27/2016 28.7  27.0 - 33.0 pg Final  . MCHC 03/27/2016 32.8  32.0 - 36.0 g/dL Final  . RDW 03/27/2016 15.9* 11.0 - 15.0 % Final  . Platelets 03/27/2016 276  140 - 400 K/uL Final  . MPV 03/27/2016 10.6  7.5 - 12.5 fL Final  . Neutro Abs 03/27/2016 3445  1,500 - 7,800 cells/uL Final  . Lymphs Abs 03/27/2016 2470  850 - 3,900 cells/uL Final  . Monocytes Absolute  03/27/2016 390  200 - 950 cells/uL Final  . Eosinophils Absolute 03/27/2016 130  15 - 500 cells/uL Final  . Basophils Absolute 03/27/2016 65  0 - 200 cells/uL Final  . Neutrophils Relative % 03/27/2016 53  % Final  . Lymphocytes Relative 03/27/2016 38  % Final  . Monocytes Relative 03/27/2016 6  % Final  . Eosinophils Relative 03/27/2016 2  % Final  . Basophils Relative 03/27/2016 1  % Final  . Smear Review 03/27/2016 Criteria for review not met   Final    Imaging: No results found.  Speciality Comments: No specialty comments available.    Procedures:  No procedures performed Allergies: Sulfa antibiotics; Morphine and related; and Penicillins   Assessment / Plan:     Visit Diagnoses: Rheumatoid arthritis involving  multiple sites with positive rheumatoid factor (HCC)  ANA positive  Lateral epicondylitis, left elbow  Trochanteric bursitis of left hip  High risk medication use - Enbrel and MTX   DJD (degenerative joint disease), cervical    Orders: No orders of the defined types were placed in this encounter.  No orders of the defined types were placed in this encounter.   Face-to-face time spent with patient was *** minutes. 50% of time was spent in counseling and coordination of care.  Follow-Up Instructions: No Follow-up on file.   Jaleel Allen, RT  Note - This record has been created using [employer].  Chart creation errors have been sought, but may not always  have been located. Such creation errors do not reflect on  the standard of medical care.

## 2016-05-30 ENCOUNTER — Other Ambulatory Visit: Payer: Self-pay | Admitting: Rheumatology

## 2016-05-30 NOTE — Telephone Encounter (Signed)
Patient called requesting her MTX and folic acid sent into [facility] in [place].  Cb#[phone]

## 2016-06-02 ENCOUNTER — Other Ambulatory Visit: Payer: Self-pay | Admitting: Rheumatology

## 2016-06-02 MED ORDER — METHOTREXATE SODIUM CHEMO INJECTION 50 MG/2ML: 17.5000 mg | INTRAMUSCULAR | 0 refills | Status: DC

## 2016-06-02 NOTE — Telephone Encounter (Signed)
ok 

## 2016-06-02 NOTE — Telephone Encounter (Signed)
Patient returned your call.  [phone].

## 2016-06-02 NOTE — Telephone Encounter (Signed)
Last Visit: 01/02/16 Next Visit: 06/03/16 Labs: 03/27/16 WNL  Okay to refill MTX?

## 2016-06-02 NOTE — Telephone Encounter (Signed)
Last Visit: 01/02/16 Next Visit: 06/03/16  Okay to refill Celexa?

## 2016-06-03 ENCOUNTER — Ambulatory Visit: Payer: Medicare Other | Admitting: Rheumatology

## 2016-06-03 MED ORDER — CITALOPRAM HYDROBROMIDE 20 MG PO TABS: 20.0000 mg | ORAL_TABLET | Freq: Every day | ORAL | 5 refills | Status: AC

## 2016-06-24 DIAGNOSIS — M24522 Contracture, left elbow: Secondary | ICD-10-CM | POA: Insufficient documentation

## 2016-06-24 DIAGNOSIS — M24521 Contracture, right elbow: Secondary | ICD-10-CM | POA: Insufficient documentation

## 2016-06-24 NOTE — Progress Notes (Signed)
Office Visit Note  Patient: [name] [name]             Date of Birth: 1961/02/24           MRN: [id number]             PCP: Osborne Casco, MD Referring: Kelton Pillar, MD Visit Date: 07/01/2016 Occupation: '@GUAROCC'$ @    Subjective:  Right hip pain   History of Present Illness: [name] [name] is a 56 y.o. female with history of rheumatoid arthritis and osteoarthritis overlap. She states she was having some discomfort in her right hip but the pain has eased off. She has not had any increased joint swelling. She has noticed some tremors in her hands when she is doing activities.  Activities of Daily Living:  Patient reports morning stiffness for 15 minute.   Patient Denies nocturnal pain.  Difficulty dressing/grooming: Denies Difficulty climbing stairs: Reports Difficulty getting out of chair: Reports Difficulty using hands for taps, buttons, cutlery, and/or writing: Reports   Review of Systems  Constitutional: Negative for fatigue, night sweats, weight gain, weight loss and weakness.  HENT: Negative for mouth sores, trouble swallowing, trouble swallowing, mouth dryness and nose dryness.   Eyes: Negative for pain, redness, visual disturbance and dryness.  Respiratory: Negative for cough, shortness of breath and difficulty breathing.   Cardiovascular: Negative for chest pain, palpitations, hypertension, irregular heartbeat and swelling in legs/feet.  Gastrointestinal: Negative for blood in stool, constipation and diarrhea.  Endocrine: Negative for increased urination.  Genitourinary: Negative for vaginal dryness.  Musculoskeletal: Positive for arthralgias, joint pain and morning stiffness. Negative for joint swelling, myalgias, muscle weakness, muscle tenderness and myalgias.  Skin: Negative for color change, rash, hair loss, skin tightness, ulcers and sensitivity to sunlight.  Allergic/Immunologic: Negative for susceptible to infections.  Neurological: Negative for  dizziness, memory loss and night sweats.  Hematological: Negative for swollen glands.  Psychiatric/Behavioral: Positive for sleep disturbance. Negative for depressed mood. The patient is not nervous/anxious.     PMFS History:  Patient Active Problem List   Diagnosis Date Noted  . Contracture of right elbow 06/24/2016  . Contracture of left elbow 06/24/2016  . Rheumatoid arthritis involving multiple sites with positive rheumatoid factor ([place]) 05/27/2016  . ANA positive 05/27/2016  . Lateral epicondylitis, left elbow 05/27/2016  . Trochanteric bursitis of left hip 05/27/2016  . High risk medication use 05/27/2016  . DJD (degenerative joint disease), cervical 05/27/2016  . Osteopenia of multiple sites 05/27/2016  . Primary osteoarthritis of both feet 05/27/2016  . S/P TAH-BSO (5/30) 09/04/2012    Past Medical History:  Diagnosis Date  . Arthritis    rheumotroid  . Complication of anesthesia    "irregular heartbeat" during neck surgery    No family history on file. Past Surgical History:  Procedure Laterality Date  . ABDOMINAL HYSTERECTOMY N/A 09/03/2012   Procedure: HYSTERECTOMY ABDOMINAL;  Surgeon: Marvene Staff, MD;  Location: [facility];  Service: Gynecology;  Laterality: N/A;  . CERVICAL SPINE SURGERY    . FOOT SURGERY Bilateral   . MEDIAL PARTIAL KNEE REPLACEMENT Bilateral   . SALPINGOOPHORECTOMY Bilateral 09/03/2012   Procedure: SALPINGO OOPHORECTOMY;  Surgeon: Marvene Staff, MD;  Location: [facility];  Service: Gynecology;  Laterality: Bilateral;  . TONSILLECTOMY     Social History   Social History Narrative  . No narrative on file     Objective: Vital Signs: BP 136/76   Pulse 78   Resp 16   Ht '5\' 2"'$  (  1.575 m)   Wt 168 lb (76.2 kg)   BMI 30.73 kg/m    Physical Exam  Constitutional: She is oriented to person, place, and time. She appears well-developed and well-nourished.  HENT:  Head: Normocephalic and atraumatic.  Eyes: Conjunctivae and EOM are  normal.  Neck: Normal range of motion.  Cardiovascular: Normal rate, regular rhythm, normal heart sounds and intact distal pulses.   Pulmonary/Chest: Effort normal and breath sounds normal.  Abdominal: Soft. Bowel sounds are normal.  Lymphadenopathy:    She has no cervical adenopathy.  Neurological: She is alert and oriented to person, place, and time.  Skin: Skin is warm and dry. Capillary refill takes less than 2 seconds.  Psychiatric: She has a normal mood and affect. Her behavior is normal.  Nursing note and vitals reviewed.    Musculoskeletal Exam: C-spine very limited range of motion she is good range of motion of her thoracic and lumbar spine. Shoulder joints full range of motion. She has contractures in her bilateral elbows. Very limited range of motion in her wrists joints. She has subluxation of many of her MCPs and PIPs with no synovitis. She synovial thickening in her MCPs and PIP joints. Hip joints are good range of motion she is some warmth on palpation of her left knee joint ankle joints MTPs have some chronic changes but no active synovitis.  CDAI Exam: CDAI Homunculus Exam:   Joint Counts:  CDAI Tender Joint count: 0 CDAI Swollen Joint count: 0  Global Assessments:  Patient Global Assessment: 2 Provider Global Assessment: 2  CDAI Calculated Score: 4    Investigation: Findings:  03/15/2015 X-rays of the bilateral hands, 2 views, obtained today showed severe intercarpal joint, radiocarpal joint, and MCP joint narrowing and all PIP narrowing.  No erosive changes.  There was no interval change from February 2013.  X-rays of the bilateral feet, 2 views, showed bilateral first MTP fused with plates and screws.  All other MTPs appear to have some post surgical changes.  They are all subluxed with erosive changes.  There is severe tibial tarsal joint narrowing with no interval change from February 2013.  bone density from June 2016, and her T-score was -1.6 consistent with  osteopenia. Rheumatoid arthritis with positive rheumatoid factor, positive CCP, and positive ANA.  She has severe erosive disease with multiple contractures.      Orders Only on 06/25/2016  Component Date Value Ref Range Status  . Sodium 06/25/2016 141  135 - 146 mmol/L Final  . Potassium 06/25/2016 4.3  3.5 - 5.3 mmol/L Final  . Chloride 06/25/2016 105  98 - 110 mmol/L Final  . CO2 06/25/2016 22  20 - 31 mmol/L Final  . Glucose, Bld 06/25/2016 79  65 - 99 mg/dL Final  . BUN 06/25/2016 11  7 - 25 mg/dL Final  . Creat 06/25/2016 0.77  0.50 - 1.05 mg/dL Final   Comment:   For patients > or = 54 years of age: The upper reference limit for Creatinine is approximately 13% higher for people identified as African-American.     . Total Bilirubin 06/25/2016 0.4  0.2 - 1.2 mg/dL Final  . Alkaline Phosphatase 06/25/2016 83  33 - 130 U/L Final  . AST 06/25/2016 18  10 - 35 U/L Final  . ALT 06/25/2016 12  6 - 29 U/L Final  . Total Protein 06/25/2016 7.1  6.1 - 8.1 g/dL Final  . Albumin 06/25/2016 3.9  3.6 - 5.1 g/dL Final  . Calcium  06/25/2016 9.3  8.6 - 10.4 mg/dL Final  . GFR, Est African American 06/25/2016 >89  >=60 mL/min Final  . GFR, Est Non African American 06/25/2016 88  >=60 mL/min Final  . WBC 06/25/2016 7.6  3.8 - 10.8 K/uL Final  . RBC 06/25/2016 4.91  3.80 - 5.10 MIL/uL Final  . Hemoglobin 06/25/2016 13.6  11.7 - 15.5 g/dL Final  . HCT 06/25/2016 41.7  35.0 - 45.0 % Final  . MCV 06/25/2016 84.9  80.0 - 100.0 fL Final  . MCH 06/25/2016 27.7  27.0 - 33.0 pg Final  . MCHC 06/25/2016 32.6  32.0 - 36.0 g/dL Final  . RDW 06/25/2016 14.3  11.0 - 15.0 % Final  . Platelets 06/25/2016 323  140 - 400 K/uL Final  . MPV 06/25/2016 10.8  7.5 - 12.5 fL Final  . Neutro Abs 06/25/2016 3724  1,500 - 7,800 cells/uL Final  . Lymphs Abs 06/25/2016 3268  850 - 3,900 cells/uL Final  . Monocytes Absolute 06/25/2016 456  200 - 950 cells/uL Final  . Eosinophils Absolute 06/25/2016 76  15 - 500  cells/uL Final  . Basophils Absolute 06/25/2016 76  0 - 200 cells/uL Final  . Neutrophils Relative % 06/25/2016 49  % Final  . Lymphocytes Relative 06/25/2016 43  % Final  . Monocytes Relative 06/25/2016 6  % Final  . Eosinophils Relative 06/25/2016 1  % Final  . Basophils Relative 06/25/2016 1  % Final  . Smear Review 06/25/2016 Criteria for review not met   Final    Imaging: No results found.  Speciality Comments: No specialty comments available.    Procedures:  No procedures performed Allergies: Sulfa antibiotics; Morphine and related; and Penicillins   Assessment / Plan:     Visit Diagnoses: Rheumatoid arthritis involving multiple sites with positive rheumatoid factor (HCC) - Positive RF, positive anti-CCP, positive ANA, erosive disease with multiple contractures. She is doing quite well on current regimen. She has no synovitis on examination today. She states her strength was not very good in her hands she's been going to the gym on regular basis. The hand muscle strengthening exercises were demonstrated today.  High risk medication use - Methotrexate 17.5 mg subcutaneous every week, folic acid 2 mg by mouth daily, Enbrel 50 mg subcutaneous every week. Her labs have been stable we'll check her labs can in 3 months and then every 3 months to monitor for drug toxicity.  Lateral epicondylitis, left elbow: Improved Contracture of elbow, bilateral  Trochanteric bursitis of right hip: Exercises were demonstrated and discussed.  Primary osteoarthritis of both feet  Osteopenia of multiple sites - bone density from June 2016, and her T-score was -1.6 consistent with osteopenia.  DJD (degenerative joint disease), cervical    Orders: Orders Placed This Encounter  Procedures  . CBC with Differential/Platelet  . COMPLETE METABOLIC PANEL WITH GFR   Meds ordered this encounter  Medications  . TUBERCULIN SYR 1CC/27GX1/2" 27G X 1/2" 1 ML MISC    Sig: Once a week with methotrexate     Dispense:  12 each    Refill:  4    Face-to-face time spent with patient was 30 minutes. 50% of time was spent in counseling and coordination of care.  Follow-Up Instructions: Return in about 5 months (around 12/01/2016) for Rheumatoid arthritis.   Bo Merino, MD  Note - This record has been created using [employer].  Chart creation errors have been sought, but may not always  have been located. Such  creation errors do not reflect on  the standard of medical care.

## 2016-06-25 ENCOUNTER — Other Ambulatory Visit: Payer: Self-pay | Admitting: Rheumatology

## 2016-06-25 LAB — COMPLETE METABOLIC PANEL WITH GFR
ALT: 12 U/L (ref 6–29)
AST: 18 U/L (ref 10–35)
Albumin: 3.9 g/dL (ref 3.6–5.1)
Alkaline Phosphatase: 83 U/L (ref 33–130)
BUN: 11 mg/dL (ref 7–25)
CO2: 22 mmol/L (ref 20–31)
Calcium: 9.3 mg/dL (ref 8.6–10.4)
Chloride: 105 mmol/L (ref 98–110)
Creat: 0.77 mg/dL (ref 0.50–1.05)
GFR, Est African American: >89 mL/min (ref >=60)
GFR, Est Non African American: 88 mL/min (ref >=60)
Glucose, Bld: 79 mg/dL (ref 65–99)
Potassium: 4.3 mmol/L (ref 3.5–5.3)
Sodium: 141 mmol/L (ref 135–146)
Total Bilirubin: 0.4 mg/dL (ref 0.2–1.2)
Total Protein: 7.1 g/dL (ref 6.1–8.1)

## 2016-06-25 LAB — CBC WITH DIFFERENTIAL/PLATELET
Basophils Absolute: 76 cells/uL (ref 0–200)
Basophils Relative: 1 %
Eosinophils Absolute: 76 cells/uL (ref 15–500)
Eosinophils Relative: 1 %
HCT: 41.7 % (ref 35.0–45.0)
Hemoglobin: 13.6 g/dL (ref 11.7–15.5)
Lymphocytes Relative: 43 %
Lymphs Abs: 3268 cells/uL (ref 850–3900)
MCH: 27.7 pg (ref 27.0–33.0)
MCHC: 32.6 g/dL (ref 32.0–36.0)
MCV: 84.9 fL (ref 80.0–100.0)
MPV: 10.8 fL (ref 7.5–12.5)
Monocytes Absolute: 456 cells/uL (ref 200–950)
Monocytes Relative: 6 %
Neutro Abs: 3724 cells/uL (ref 1500–7800)
Neutrophils Relative %: 49 %
Platelets: 323 10*3/uL (ref 140–400)
RBC: 4.91 MIL/uL (ref 3.80–5.10)
RDW: 14.3 % (ref 11.0–15.0)
WBC: 7.6 10*3/uL (ref 3.8–10.8)

## 2016-06-26 NOTE — Progress Notes (Signed)
Labs normal.

## 2016-06-27 ENCOUNTER — Telehealth: Payer: Self-pay | Admitting: Radiology

## 2016-06-27 NOTE — Telephone Encounter (Signed)
-----   Message from Bo Merino, MD sent at 06/26/2016  1:16 PM EDT ----- Labs normal

## 2016-06-27 NOTE — Telephone Encounter (Signed)
I have called patient to advise labs are normal  

## 2016-07-01 ENCOUNTER — Ambulatory Visit (INDEPENDENT_AMBULATORY_CARE_PROVIDER_SITE_OTHER): Payer: No Typology Code available for payment source | Admitting: Rheumatology

## 2016-07-01 ENCOUNTER — Encounter: Payer: Self-pay | Admitting: Rheumatology

## 2016-07-01 VITALS — BP 136/76 | HR 78 | Resp 16 | Ht 62.0 in | Wt 168.0 lb

## 2016-07-01 DIAGNOSIS — M0579 Rheumatoid arthritis with rheumatoid factor of multiple sites without organ or systems involvement: Secondary | ICD-10-CM

## 2016-07-01 DIAGNOSIS — Z79899 Other long term (current) drug therapy: Secondary | ICD-10-CM

## 2016-07-01 DIAGNOSIS — M19072 Primary osteoarthritis, left ankle and foot: Secondary | ICD-10-CM | POA: Diagnosis not present

## 2016-07-01 DIAGNOSIS — M7712 Lateral epicondylitis, left elbow: Secondary | ICD-10-CM | POA: Diagnosis not present

## 2016-07-01 DIAGNOSIS — M24529 Contracture, unspecified elbow: Secondary | ICD-10-CM | POA: Diagnosis not present

## 2016-07-01 DIAGNOSIS — M5030 Other cervical disc degeneration, unspecified cervical region: Secondary | ICD-10-CM | POA: Diagnosis not present

## 2016-07-01 DIAGNOSIS — M7061 Trochanteric bursitis, right hip: Secondary | ICD-10-CM | POA: Diagnosis not present

## 2016-07-01 DIAGNOSIS — M8589 Other specified disorders of bone density and structure, multiple sites: Secondary | ICD-10-CM

## 2016-07-01 DIAGNOSIS — M19071 Primary osteoarthritis, right ankle and foot: Secondary | ICD-10-CM | POA: Diagnosis not present

## 2016-07-01 DIAGNOSIS — M47812 Spondylosis without myelopathy or radiculopathy, cervical region: Secondary | ICD-10-CM

## 2016-07-01 MED ORDER — "TUBERCULIN SYRINGE 27G X 1/2"" 1 ML MISC": 4 refills | Status: DC

## 2016-07-01 NOTE — Patient Instructions (Signed)
Standing Labs We placed an order today for your standing lab work.    Please come back and get your standing labs in June and every 3 months  We have open lab Monday through Friday from 8:30-11:30 AM and 1:30-4 PM at the office of Dr. Kristeena Meineke/Naitik Panwala, PA.   The office is located at [address], [address], [place], [place] [address] No appointment is necessary.   Labs are drawn by [facility].  You may receive a bill from [facility] for your lab work.    

## 2016-07-11 ENCOUNTER — Other Ambulatory Visit: Payer: Self-pay | Admitting: Rheumatology

## 2016-07-11 NOTE — Telephone Encounter (Signed)
Last Visit: 07/01/16 Next Visit: 12/02/16 Labs: 06/25/16 WNL  Okay to refill MTX?

## 2016-07-11 NOTE — Telephone Encounter (Signed)
ok 

## 2016-08-04 ENCOUNTER — Other Ambulatory Visit: Payer: Self-pay | Admitting: Rheumatology

## 2016-08-04 NOTE — Telephone Encounter (Signed)
07/01/16 last visit  06/25/16 labs WNL Next visit 12/02/16 TB gold 03/27/16 Ok to refill per Dr Estanislado Pandy

## 2016-08-15 ENCOUNTER — Telehealth: Payer: Self-pay | Admitting: Rheumatology

## 2016-08-15 NOTE — Telephone Encounter (Signed)
called pt She states area about 2 inches long / I have tried to get her to use Zyrtec prior to the next injection, but patient states she feels like the Enbrel is not working any longer she has been on it for a lot of years.  She declines to reorder the medication, and will make appointment to discuss other medication options.    To you FYI

## 2016-08-15 NOTE — Telephone Encounter (Signed)
After patient gave herself the Embrel injection she noticed a red streak  at the injection site. Patient wants to discuss getting a new medicine if possible.

## 2016-08-20 ENCOUNTER — Telehealth: Payer: Self-pay | Admitting: *Deleted

## 2016-08-20 NOTE — Telephone Encounter (Signed)
Patient states she took her Enbrel injection yesterday 08/19/16. Patient states she had a reaction to the medication. Patient states she had a 2 inch red mark go across her leg. Patient states she no longer wants to continue. Patient states she is having pain in her back and in her hands and her hands are swollen. Patient state she is also having swelling in her feet. Patient states she is having right hip pain. Patient has been schedule for 08/26/16 at 8:45 am. Patient advised to take claritin or Zytrec for reaction.

## 2016-08-22 NOTE — Progress Notes (Signed)
Office Visit Note  Patient: [name] [name]             Date of Birth: 1961/01/26           MRN: [id number]             PCP: Kelton Pillar, MD Referring: Kelton Pillar, MD Visit Date: 08/26/2016 Occupation: '@GUAROCC' @    Subjective:  Pain of the Right Ankle (States ankles swollen ); Pain of the Left Ankle; and Medication Management (patient states she will no longer use enbrel due to injection site reaction )   History of Present Illness: [name] [name] is a 56 y.o. female with history of rheumatoid arthritis. She states she's not been not doing well recently. She's been having increased swelling in her bilateral ankle joints. She believes the Enbrel is not effective anymore. She's also having injection site reaction from Enbrel now. She's been on Enbrel for 18 years now. She has tried Humira in the past and had frequent infections on it. She's also tried IV Remicade but did not like the IV access. She continues to have discomfort in her hands and knee joints. She's also experiencing some pain in her lower back.  Activities of Daily Living:  Patient reports morning stiffness for 20 minutes.   Patient Reports nocturnal pain.  Difficulty dressing/grooming: Reports Difficulty climbing stairs: Reports Difficulty getting out of chair: Reports Difficulty using hands for taps, buttons, cutlery, and/or writing: Reports   Review of Systems  Constitutional: Negative for fatigue, night sweats, weight gain, weight loss and weakness.  HENT: Negative for mouth sores, trouble swallowing, trouble swallowing, mouth dryness and nose dryness.   Eyes: Negative for pain, redness, visual disturbance and dryness.  Respiratory: Negative for cough, shortness of breath and difficulty breathing.   Cardiovascular: Negative for chest pain, palpitations, hypertension, irregular heartbeat and swelling in legs/feet.  Gastrointestinal: Negative for blood in stool, constipation and diarrhea.  Endocrine:  Negative for increased urination.  Genitourinary: Negative for vaginal dryness.  Musculoskeletal: Positive for arthralgias, joint pain, joint swelling and morning stiffness. Negative for myalgias, muscle weakness, muscle tenderness and myalgias.  Skin: Negative for color change, rash, hair loss, skin tightness, ulcers and sensitivity to sunlight.  Allergic/Immunologic: Negative for susceptible to infections.  Neurological: Negative for dizziness, memory loss and night sweats.  Hematological: Negative for swollen glands.  Psychiatric/Behavioral: Negative for depressed mood and sleep disturbance. The patient is not nervous/anxious.     PMFS History:  Patient Active Problem List   Diagnosis Date Noted  . Contracture of right elbow 06/24/2016  . Contracture of left elbow 06/24/2016  . Rheumatoid arthritis involving multiple sites with positive rheumatoid factor ([place]) 05/27/2016  . ANA positive 05/27/2016  . Lateral epicondylitis, left elbow 05/27/2016  . Trochanteric bursitis of left hip 05/27/2016  . High risk medication use 05/27/2016  . DJD (degenerative joint disease), cervical 05/27/2016  . Osteopenia of multiple sites 05/27/2016  . Primary osteoarthritis of both feet 05/27/2016  . S/P TAH-BSO (5/30) 09/04/2012    Past Medical History:  Diagnosis Date  . Arthritis    rheumotroid  . Complication of anesthesia    "irregular heartbeat" during neck surgery    No family history on file. Past Surgical History:  Procedure Laterality Date  . ABDOMINAL HYSTERECTOMY N/A 09/03/2012   Procedure: HYSTERECTOMY ABDOMINAL;  Surgeon: Marvene Staff, MD;  Location: [facility];  Service: Gynecology;  Laterality: N/A;  . CERVICAL SPINE SURGERY    . FOOT SURGERY Bilateral   .  MEDIAL PARTIAL KNEE REPLACEMENT Bilateral   . SALPINGOOPHORECTOMY Bilateral 09/03/2012   Procedure: SALPINGO OOPHORECTOMY;  Surgeon: Marvene Staff, MD;  Location: [facility];  Service: Gynecology;  Laterality: Bilateral;   . TONSILLECTOMY     Social History   Social History Narrative  . No narrative on file     Objective: Vital Signs: BP 122/68   Pulse 78   Resp 14   Ht '5\' 2"'  (1.575 m)   Wt 172 lb (78 kg)   BMI 31.46 kg/m    Physical Exam  Constitutional: She is oriented to person, place, and time. She appears well-developed and well-nourished.  HENT:  Head: Normocephalic and atraumatic.  Eyes: Conjunctivae and EOM are normal.  Neck: Normal range of motion.  Cardiovascular: Normal rate, regular rhythm, normal heart sounds and intact distal pulses.   Pulmonary/Chest: Effort normal and breath sounds normal.  Abdominal: Soft. Bowel sounds are normal.  Lymphadenopathy:    She has no cervical adenopathy.  Neurological: She is alert and oriented to person, place, and time.  Skin: Skin is warm and dry. Capillary refill takes less than 2 seconds.  Psychiatric: She has a normal mood and affect. Her behavior is normal.  Nursing note and vitals reviewed.    Musculoskeletal Exam: C-spine limited range of motion. She thoracic and lumbar spine fairly good range of motion. She has limited abduction of her bilateral shoulders. She has contractures in her bilateral elbow joints. She has very limited range of motion in her wrist joints incomplete fist formation with subluxation of her MCP joints and ulnar deviation. Synovitis in her joints as described below. She severe arthritis in her knee joints and ankle joints and MTP changes.  CDAI Exam: CDAI Homunculus Exam:   Tenderness:  RUE: ulnohumeral and radiohumeral LUE: ulnohumeral and radiohumeral Right hand: 2nd MCP and 3rd MCP Left hand: 2nd MCP and 3rd MCP RLE: tibiotalar LLE: tibiofemoral and tibiotalar  Swelling:  Right hand: 2nd MCP and 3rd MCP Left hand: 2nd MCP RLE: tibiotalar LLE: tibiofemoral  Joint Counts:  CDAI Tender Joint count: 7 CDAI Swollen Joint count: 4  Global Assessments:  Patient Global Assessment: 7 Provider Global  Assessment: 7  CDAI Calculated Score: 25    Investigation: No additional findings. CBC Latest Ref Rng & Units 06/25/2016 03/27/2016 09/04/2012  WBC 3.8 - 10.8 K/uL 7.6 6.5 9.6  Hemoglobin 11.7 - 15.5 g/dL 13.6 13.3 9.4(L)  Hematocrit 35.0 - 45.0 % 41.7 40.5 28.5(L)  Platelets 140 - 400 K/uL 323 276 287   CMP Latest Ref Rng & Units 06/25/2016 03/27/2016 09/05/2012  Glucose 65 - 99 mg/dL 79 96 99  BUN 7 - 25 mg/dL '11 13 6  ' Creatinine 0.50 - 1.05 mg/dL 0.77 0.80 0.75  Sodium 135 - 146 mmol/L 141 140 140  Potassium 3.5 - 5.3 mmol/L 4.3 4.1 3.7  Chloride 98 - 110 mmol/L 105 105 106  CO2 20 - 31 mmol/L '22 25 29  ' Calcium 8.6 - 10.4 mg/dL 9.3 9.1 8.6  Total Protein 6.1 - 8.1 g/dL 7.1 6.5 -  Total Bilirubin 0.2 - 1.2 mg/dL 0.4 0.6 -  Alkaline Phos 33 - 130 U/L 83 89 -  AST 10 - 35 U/L 18 18 -  ALT 6 - 29 U/L 12 12 -    Imaging: Xr Foot 2 Views Left  Result Date: 08/26/2016 Postsurgical changes noted in all MTP joints. Severe osteopenia noted. Subluxation of all MTP is noted. This was noted in the left first MTP.  Intertarsal joint space noted. Subtalar joint space narrowing noted. Impression: Severe rheumatoid arthritis with erosive changes and postsurgical changes. No interval change from December 2016 noted  Xr Foot 2 Views Right  Result Date: 08/26/2016 Plate and screws noted in the right first MTP joint where she has multiple erosive changes. She has postsurgical changes in her right second third and fourth fifth MTP joints. Erosive changes were noted as well. She has osteopenia and those joints . Subtalar joint space narrowing noted Severe erosive changes and postsurgical changes noted in the right foot. No interval change from 2016 noted  Xr Hand 2 View Left  Result Date: 08/26/2016 Severe joint space narrowing of all MCP joints with subluxation of all MCP joints. Erosive changes of all MCP joints noted. Severe PIP joint narrowing was noted. Second and third DIP joints narrowing.  Severe intercarpal joint space narrowing was noted. Radiocarpal joint space narrowing was noted. Erosive changes were noted. Impression these signs and consistent with severe rheumatoid arthritis. No interval change from December 2016 noted  Xr Hand 2 View Right  Result Date: 08/26/2016 Severe joint space narrowing of all MCP joints with subluxation of all MCP joints. Erosive changes of all MCP joints noted. Severe PIP joint narrowing was noted. Second and third DIP joints narrowing. Severe intercarpal joint space narrowing was noted. Radiocarpal joint space narrowing was noted. Erosive changes were noted. Impression these signs and consistent with severe rheumatoid arthritis. No interval change from December 2016 noted   Speciality Comments: No specialty comments available.    Procedures:  No procedures performed Allergies: Sulfa antibiotics; Morphine and related; and Penicillins   Assessment / Plan:     Visit Diagnoses: Rheumatoid arthritis involving multiple sites with positive rheumatoid factor [facility]): Patient is having flare with synovitis in multiple joints. She's been having increased pain and swelling in her bilateral hands knees and ankle joints and having difficulty walking. She's been on Enbrel for 18 years. She's having inadequate response to Enbrel now. We had detailed discussion regarding different treatment options and their side effects. After reviewing indications side effects contraindications she is more inclined towards going on [place]. Informed consent was taken. We will apply for Xeljanz. I will obtain ESR, fasting lipid panel, hep panel, HIV, SPEP, immunoglobulins today. She will discontinue Enbrel and continue on methotrexate. Once her labs are available we will start her on Xeljanz 11 mg by mouth daily. We will check labs and one month and then every 2 months to monitor for drug toxicity. She will need a repeat fasting lipid panel in 2 months.  High risk medication use -she  is currently on Enbrel and methotrexate combination. She had been on Humira which caused increased infections, Remicade-she did not like IV option, Enbrel injection site reaction and inadequate response  Contracture of Bilateral elbow  Pain and swelling in bilateral ankle joints. She's been having increased swelling in her ankles.  Osteopenia of multiple sites  DJD (degenerative joint disease), cervical: She has limited range of motion     Orders: Orders Placed This Encounter  Procedures  . XR Hand 2 View Left  . XR Hand 2 View Right  . XR Foot 2 Views Left  . XR Foot 2 Views Right  . Protein electrophoresis, serum  . IgG, IgA, IgM  . HIV antibody  . Hepatitis panel, acute  . Sedimentation rate  . Lipid panel  . Lipid panel   No orders of the defined types were placed in this encounter.  Face-to-face time spent with patient was 30 minutes. 50% of time was spent in counseling and coordination of care.  Follow-Up Instructions: Return in about 2 months (around 10/26/2016) for Rheumatoid arthritis.   Bo Merino, MD  Note - This record has been created using Editor, commissioning.  Chart creation errors have been sought, but may not always  have been located. Such creation errors do not reflect on  the standard of medical care.

## 2016-08-26 ENCOUNTER — Telehealth: Payer: Self-pay | Admitting: Radiology

## 2016-08-26 ENCOUNTER — Telehealth: Payer: Self-pay

## 2016-08-26 ENCOUNTER — Ambulatory Visit (INDEPENDENT_AMBULATORY_CARE_PROVIDER_SITE_OTHER): Payer: No Typology Code available for payment source

## 2016-08-26 ENCOUNTER — Encounter: Payer: Self-pay | Admitting: Rheumatology

## 2016-08-26 ENCOUNTER — Ambulatory Visit (INDEPENDENT_AMBULATORY_CARE_PROVIDER_SITE_OTHER): Payer: No Typology Code available for payment source | Admitting: Rheumatology

## 2016-08-26 VITALS — BP 122/68 | HR 78 | Resp 14 | Ht 62.0 in | Wt 172.0 lb

## 2016-08-26 DIAGNOSIS — M0579 Rheumatoid arthritis with rheumatoid factor of multiple sites without organ or systems involvement: Secondary | ICD-10-CM

## 2016-08-26 DIAGNOSIS — M19071 Primary osteoarthritis, right ankle and foot: Secondary | ICD-10-CM | POA: Diagnosis not present

## 2016-08-26 DIAGNOSIS — Z79899 Other long term (current) drug therapy: Secondary | ICD-10-CM

## 2016-08-26 DIAGNOSIS — M19072 Primary osteoarthritis, left ankle and foot: Secondary | ICD-10-CM | POA: Diagnosis not present

## 2016-08-26 DIAGNOSIS — M25473 Effusion, unspecified ankle: Secondary | ICD-10-CM

## 2016-08-26 DIAGNOSIS — M79641 Pain in right hand: Secondary | ICD-10-CM

## 2016-08-26 DIAGNOSIS — M79642 Pain in left hand: Secondary | ICD-10-CM

## 2016-08-26 DIAGNOSIS — M47812 Spondylosis without myelopathy or radiculopathy, cervical region: Secondary | ICD-10-CM

## 2016-08-26 DIAGNOSIS — M8589 Other specified disorders of bone density and structure, multiple sites: Secondary | ICD-10-CM | POA: Diagnosis not present

## 2016-08-26 DIAGNOSIS — M5030 Other cervical disc degeneration, unspecified cervical region: Secondary | ICD-10-CM

## 2016-08-26 DIAGNOSIS — M24522 Contracture, left elbow: Secondary | ICD-10-CM | POA: Diagnosis not present

## 2016-08-26 DIAGNOSIS — M25579 Pain in unspecified ankle and joints of unspecified foot: Secondary | ICD-10-CM | POA: Diagnosis not present

## 2016-08-26 DIAGNOSIS — M24521 Contracture, right elbow: Secondary | ICD-10-CM | POA: Diagnosis not present

## 2016-08-26 NOTE — Patient Instructions (Addendum)
Talk to your primary care provider about getting a pneumonia vaccine and the Shingrix vaccine.  Standing Labs We placed an order today for your standing lab work.    Please come back and get your standing labs in 1 month then every 2 months.  We have open lab Monday through Friday from 8:30-11:30 AM and 1:30-4 PM at the office of Dr. Tresa Moore, PA.   The office is located at [address] [place], [place], [place] [address] No appointment is necessary.   Labs are drawn by [facility].  You may receive a bill from [facility] for your lab work. If you have any questions regarding directions or hours of operation,  please call [phone].     Tofacitinib extended-release tablets What is this medicine? TOFACITINIB (TOE fa SYE it nib) is a medicine that works on the immune system. This medicine is used to treat rheumatoid arthritis and psoriatic arthritis. This medicine may be used for other purposes; ask your health care provider or pharmacist if you have questions. COMMON BRAND NAME(S): Morrie Sheldon XR What should I tell my health care provider before I take this medicine? They need to know if you have any of these conditions: -cancer -diabetes -high cholesterol -immune system problems -infection (especially a virus infection such as chickenpox, cold sores, or herpes) -kidney disease -liver disease -low blood counts, like low white cell, platelet, or red cell counts -stomach or intestine problems -an unusual or allergic reaction to tofacitinib, other medicines, foods, dyes, or preservatives -pregnant or trying to get pregnant -breast-feeding How should I use this medicine? Take this medicine by mouth with a glass of water. Follow the directions on the prescription label. Do not cut, crush or chew this medicine. You can take it with or without food. If it upsets your stomach, take it with food. Take your medicine at regular intervals. Do not take it more often than  directed. Do not stop taking except on your doctor's advice. A special MedGuide will be given to you by the pharmacist with each prescription and refill. Be sure to read this information carefully each time. Talk to your pediatrician regarding the use of this medicine in children. Special care may be needed. Overdosage: If you think you have taken too much of this medicine contact a poison control center or emergency room at once. NOTE: This medicine is only for you. Do not share this medicine with others. What if I miss a dose? If you miss a dose, take it as soon as you can. If it is almost time for your next dose, take only that dose. Do not take double or extra doses. What may interact with this medicine? Do not take this medicine with any of the following medications: -azathioprine, cyclosporine, or other immunosuppressive drugs -biologic medicines for arthritis such as abatacept, adalimumab, anakinra, certolizumab, etanercept, golimumab, infliximab, rituximab, secukinumab, tocilizumab, ustekinumab -live vaccines This medicine may also interact with the following medications: -antiviral medicines for hepatitis, HIV or AIDS -certain medicines for fungal infections like fluconazole, itraconazole, ketoconazole, voriconazole -certain medicines for seizures like carbamazepine, phenobarbital, phenytoin -rifampin -supplements, such as St. John's wort This list may not describe all possible interactions. Give your health care provider a list of all the medicines, herbs, non-prescription drugs, or dietary supplements you use. Also tell them if you smoke, drink alcohol, or use illegal drugs. Some items may interact with your medicine. What should I watch for while using this medicine? Tell your doctor or healthcare professional if your  symptoms do not start to get better or if they get worse. The tablet shell of this medicine does not dissolve. This is normal. The tablet shell may appear whole in the  stool. This is not a cause for concern. Avoid taking products that contain aspirin, acetaminophen, ibuprofen, naproxen, or ketoprofen unless instructed by your doctor. These medicines may hide a fever. Call your doctor or health care professional for advice if you get a fever, chills or sore throat, or other symptoms of a cold or flu. Do not treat yourself. This drug decreases your body's ability to fight infections. Try to avoid being around people who are sick. What side effects may I notice from receiving this medicine? Side effects that you should report to your doctor or health care professional as soon as possible: -allergic reactions like skin rash, itching or hives, swelling of the face, lips, or tongue -breathing problems -dizziness -signs of infection - fever or chills, cough, sore throat, pain or trouble passing urine -signs and symptoms of liver injury like dark yellow or brown urine; general ill feeling or flu-like symptoms; light-colored stools; loss of appetite; nausea; right upper belly pain; unusually weak or tired; yellowing of the eyes or skin -stomach pain or a sudden change in bowel habits -unusually weak or tired Side effects that usually do not require medical attention (report to your doctor or health care professional if they continue or are bothersome): -diarrhea -headache -muscle aches -runny nose -sinus trouble This list may not describe all possible side effects. Call your doctor for medical advice about side effects. You may report side effects to FDA at 1-800-FDA-1088. Where should I keep my medicine? Keep out of the reach of children. Store between 20 and 25 degrees C (68 and 77 degrees F). Throw away any unused medicine after the expiration date. NOTE: This sheet is a summary. It may not cover all possible information. If you have questions about this medicine, talk to your doctor, pharmacist, or health care provider.  2018 Elsevier/Gold Standard (2016-04-11  11:55:35)

## 2016-08-26 NOTE — Telephone Encounter (Signed)
A prior authorization for Xeljanz XR 11mg  was submitted through cover my meds. Will update once we receive a response.   Ludwin Flahive, [place], CPhT 11:17 AM

## 2016-08-26 NOTE — Telephone Encounter (Signed)
Error

## 2016-08-26 NOTE — Progress Notes (Signed)
Pharmacy Note  Subjective: Patient presents today to the [facility] to see Dr. Estanislado Pandy.  Patient is being switched from Enbrel to [place].  Patient seen by the pharmacist for counseling on high risk medication.    Objective: TB test: negative (03/27/16) Hepatitis: ordered today Lipid panel: ordered today  CMP     Component Value Date/Time   NA 141 06/25/2016 1125   K 4.3 06/25/2016 1125   CL 105 06/25/2016 1125   CO2 22 06/25/2016 1125   GLUCOSE 79 06/25/2016 1125   BUN 11 06/25/2016 1125   CREATININE 0.77 06/25/2016 1125   CALCIUM 9.3 06/25/2016 1125   PROT 7.1 06/25/2016 1125   ALBUMIN 3.9 06/25/2016 1125   AST 18 06/25/2016 1125   ALT 12 06/25/2016 1125   ALKPHOS 83 06/25/2016 1125   BILITOT 0.4 06/25/2016 1125   GFRNONAA 88 06/25/2016 1125   GFRAA >89 06/25/2016 1125   CBC    Component Value Date/Time   WBC 7.6 06/25/2016 1125   RBC 4.91 06/25/2016 1125   HGB 13.6 06/25/2016 1125   HCT 41.7 06/25/2016 1125   PLT 323 06/25/2016 1125   MCV 84.9 06/25/2016 1125   MCH 27.7 06/25/2016 1125   MCHC 32.6 06/25/2016 1125   RDW 14.3 06/25/2016 1125   LYMPHSABS 3,268 06/25/2016 1125   MONOABS 456 06/25/2016 1125   EOSABS 76 06/25/2016 1125   BASOSABS 76 06/25/2016 1125   Assessment/Plan: Counseled patient that Morrie Sheldon is a JAK inhibitor indicated for Rheumatoid Arthritis.  Counseled patient on purpose, proper use, and adverse effects of Xeljanz.  Counseled patient that medication is prescribed to take 11 mg daily.  Reviewed the most common adverse effects including infection, diarrhea, headaches.  Also reviewed rare adverse effects such as bowel injury and the need to contact us if she develops stomach pain during treatment.  Reviewed with patient that there is the possibility of an increased risk of malignancy but it is not well understood if this increased risk is due to the medication or the disease state.  Provided patient with medication education  material and answered all questions.  Advised patient that Morrie Sheldon should be started at least one week after her most recent Enbrel.  Patient consented to [place] use.  Will apply for Morrie Sheldon through [employer].     Elisabeth Most, Pharm.D., BCPS, CPP Clinical Pharmacist Pager: [phone] Phone: [phone] 08/26/2016 9:09 AM

## 2016-08-26 NOTE — Telephone Encounter (Signed)
Received confirmationregarding a prior authorization DENIAL for Xeljnz XR 11mg .   Phone number:[phone]  Will send document to scan center.  Sallye Lunz, [place], CPhT   2:46 PM

## 2016-08-26 NOTE — Telephone Encounter (Signed)
I called patient with her xray reports as read by Dr Estanislado Pandy / RA changes noted most severe in foot, post surgical changes Right, but not much change compared to previous in Dec 2016. Patient voiced understanding.

## 2016-08-27 LAB — HEPATITIS PANEL, ACUTE
HCV Ab: NEGATIVE
Hep A IgM: NONREACTIVE
Hep B C IgM: NONREACTIVE
Hepatitis B Surface Ag: NEGATIVE

## 2016-08-27 LAB — LIPID PANEL
Cholesterol: 185 mg/dL (ref <200)
HDL: 73 mg/dL (ref >50)
LDL Cholesterol: 93 mg/dL (ref <100)
Total CHOL/HDL Ratio: 2.5 Ratio (ref <5.0)
Triglycerides: 94 mg/dL (ref <150)
VLDL: 19 mg/dL (ref <30)

## 2016-08-27 LAB — IGG, IGA, IGM
IgA: 432 mg/dL (ref 81–463)
IgG (Immunoglobin G), Serum: 1255 mg/dL (ref 694–1618)
IgM, Serum: 173 mg/dL (ref 48–271)

## 2016-08-27 LAB — SEDIMENTATION RATE: Sed Rate: 14 mm/hr (ref 0–30)

## 2016-08-27 LAB — HIV ANTIBODY (ROUTINE TESTING W REFLEX): HIV 1&2 Ab, 4th Generation: NONREACTIVE

## 2016-08-27 NOTE — Telephone Encounter (Signed)
I spoke to patient regarding Morrie Sheldon denial.  Discussed use of Orencia instead.  Counseled patient on purpose, proper use, and adverse effects of Orencia.  She agrees to try Orencia.  Will plan to apply for Orencia through patient's insurance.  Once approved, can schedule patient for nurse visit for Orencia consent and initial Orencia injection.  Will use sample pen for initial injection.    Can you submit PA for Orencia?

## 2016-08-27 NOTE — Telephone Encounter (Signed)
Please call patient since he if she wants to start on subcutaneous Orencia

## 2016-08-27 NOTE — Telephone Encounter (Signed)
Morrie Sheldon XR was denied by patient's insurance.  Preferred products for the patient's health plan are Darcus Pester and Orencia.  Patient must fail both before Morrie Sheldon would be approved.    If patient is agreeable, do you want me to apply for one of these other medications?

## 2016-08-28 ENCOUNTER — Telehealth: Payer: Self-pay

## 2016-08-28 LAB — PROTEIN ELECTROPHORESIS, SERUM
Albumin ELP: 3.9 g/dL (ref 3.8–4.8)
Alpha-1-Globulin: 0.3 g/dL (ref 0.2–0.3)
Alpha-2-Globulin: 0.8 g/dL (ref 0.5–0.9)
Beta 2: 0.5 g/dL (ref 0.2–0.5)
Beta Globulin: 0.5 g/dL (ref 0.4–0.6)
Gamma Globulin: 1.3 g/dL (ref 0.8–1.7)
Total Protein, Serum Electrophoresis: 7.3 g/dL (ref 6.1–8.1)

## 2016-08-28 NOTE — Telephone Encounter (Signed)
Submitted prior authorization via fax to [employer]. Will update once we receive a response.   Vegas Coffin, [place], CPhT 1:00 PM

## 2016-08-28 NOTE — Progress Notes (Signed)
WNL

## 2016-08-29 NOTE — Telephone Encounter (Signed)
Patient returned call.  She scheduled nurse visit for 09/02/16 at 10:00 AM.   Elisabeth Most, Pharm.D., BCPS, CPP Clinical Pharmacist Pager: [phone] Phone: [phone] 08/29/2016 10:22 AM

## 2016-08-29 NOTE — Telephone Encounter (Signed)
Received approval for Orencia (08/28/16 to 08/29/2018).  I called patient to update her and to discuss scheduling nurse visit for Orencia consent/first injection.  I left a message asking patient to call me back.    Elisabeth Most, Pharm.D., BCPS, CPP Clinical Pharmacist Pager: [phone] Phone: [phone] 08/29/2016 10:03 AM

## 2016-09-02 ENCOUNTER — Ambulatory Visit (INDEPENDENT_AMBULATORY_CARE_PROVIDER_SITE_OTHER): Payer: No Typology Code available for payment source | Admitting: *Deleted

## 2016-09-02 VITALS — BP 114/69 | HR 60

## 2016-09-02 DIAGNOSIS — M0579 Rheumatoid arthritis with rheumatoid factor of multiple sites without organ or systems involvement: Secondary | ICD-10-CM | POA: Diagnosis not present

## 2016-09-02 MED ORDER — ABATACEPT 125 MG/ML ~~LOC~~ SOAJ
125.0000 mg | Freq: Once | SUBCUTANEOUS | Status: AC
  Administered 2016-09-02: 125 mg via SUBCUTANEOUS

## 2016-09-02 MED ORDER — ABATACEPT 125 MG/ML ~~LOC~~ SOAJ: 125.0000 mg | SUBCUTANEOUS | 2 refills | Status: DC

## 2016-09-02 NOTE — Progress Notes (Signed)
Patient in office for initial start to [place]. Administered injection to left thigh. Patient tolerated injection well. Patient monitored in office for 30 minutes after administration for adverse reactions. No adverse reactions noted.   Administrations This Visit    Abatacept SOAJ 125 mg    Admin Date 09/02/2016 Action Given Dose 125 mg Route Subcutaneous Administered By Carole Binning, LPN

## 2016-09-02 NOTE — Patient Instructions (Signed)
Standing Labs We placed an order today for your standing lab work.    Please come back and get your standing labs in 1 month then every 2 months  We have open lab Monday through Friday from 8:30-11:30 AM and 1:30-4 PM at the office of Dr. Tresa Moore, PA.   The office is located at [address] [place], [place], [place] [address] No appointment is necessary.   Labs are drawn by [facility].  You may receive a bill from [facility] for your lab work. If you have any questions regarding directions or hours of operation,  please call [phone].     Abatacept solution for injection (subcutaneous or intravenous use) What is this medicine? ABATACEPT (a ba TA sept) is used to treat moderate to severe active rheumatoid arthritis or psoriatic arthritis in adults. This medicine is also used to treat juvenile idiopathic arthritis. This medicine may be used for other purposes; ask your health care provider or pharmacist if you have questions. COMMON BRAND NAME(S): Orencia What should I tell my health care provider before I take this medicine? They need to know if you have any of these conditions: -are taking other medicines to treat rheumatoid arthritis -COPD -diabetes -infection or history of infections -recently received or scheduled to receive a vaccine -scheduled to have surgery -tuberculosis, a positive skin test for tuberculosis or have recently been in close contact with someone who has tuberculosis -viral hepatitis -an unusual or allergic reaction to abatacept, other medicines, foods, dyes, or preservatives -pregnant or trying to get pregnant -breast-feeding How should I use this medicine? This medicine is for infusion into a vein or for injection under the skin. Infusions are given by a health care professional in a hospital or clinic setting. If you are to give your own medicine at home, you will be taught how to prepare and give this medicine under the skin. Use  exactly as directed. Take your medicine at regular intervals. Do not take your medicine more often than directed. It is important that you put your used needles and syringes in a special sharps container. Do not put them in a trash can. If you do not have a sharps container, call your pharmacist or healthcare provider to get one. Talk to your pediatrician regarding the use of this medicine in children. While infusions in a clinic may be prescribed for children as young as 2 years for selected conditions, precautions do apply. Overdosage: If you think you have taken too much of this medicine contact a poison control center or emergency room at once. NOTE: This medicine is only for you. Do not share this medicine with others. What if I miss a dose? This medicine is used once a week if given by injection under the skin. If you miss a dose, take it as soon as you can. If it is almost time for your next dose, take only that dose. Do not take double or extra doses. If you are to be given an infusion, it is important not to miss your dose. Doses are usually every 4 weeks. Call your doctor or health care professional if you are unable to keep an appointment. What may interact with this medicine? Do not take this medicine with any of the following medications: -adalimumab -anakinra -certolizumab -etanercept -golimumab -infliximab -live virus vaccines -rituximab -tocilizumab This medicine may also interact with the following medications: -vaccines This list may not describe all possible interactions. Give your health care provider a list of all the  medicines, herbs, non-prescription drugs, or dietary supplements you use. Also tell them if you smoke, drink alcohol, or use illegal drugs. Some items may interact with your medicine. What should I watch for while using this medicine? Visit your doctor for regular check ups while you are taking this medicine. Tell your doctor or healthcare professional if  your symptoms do not start to get better or if they get worse. Call your doctor or health care professional if you get a cold or other infection while receiving this medicine. Do not treat yourself. This medicine may decrease your body's ability to fight infection. Try to avoid being around people who are sick. What side effects may I notice from receiving this medicine? Side effects that you should report to your doctor or health care professional as soon as possible: -allergic reactions like skin rash, itching or hives, swelling of the face, lips, or tongue -breathing problems -chest pain -signs of infection - fever or chills, cough, unusual tiredness, pain or trouble passing urine, or warm, red or painful skin Side effects that usually do not require medical attention (report to your doctor or health care professional if they continue or are bothersome): -dizziness -headache -nausea, vomiting -sore throat -stomach upset This list may not describe all possible side effects. Call your doctor for medical advice about side effects. You may report side effects to FDA at 1-800-FDA-1088. Where should I keep my medicine? Infusions will be given in a hospital or clinic and will not be stored at home. Storage for syringes given under the skin and stored at home: Keep out of the reach of children. Store in a refrigerator between 2 and 8 degrees C (36 and 46 degrees F). Keep this medicine in the original container. Protect from light. Do not freeze. Throw away any unused medicine after the expiration date. NOTE: This sheet is a summary. It may not cover all possible information. If you have questions about this medicine, talk to your doctor, pharmacist, or health care provider.  2018 Elsevier/Gold Standard (2015-10-11 10:07:35)

## 2016-09-02 NOTE — Progress Notes (Signed)
Pharmacy Note  Subjective: Patient presents today to the [facility] for consent on Orencia and initial Orencia injection.  Patient is being switched from Enbrel.  Patient confirms it has been at least one week since her most recent Enbrel injection.  Patient seen by the pharmacist for counseling on subcutaneous Orencia.    Objective: CBC    Component Value Date/Time   WBC 7.6 06/25/2016 1125   RBC 4.91 06/25/2016 1125   HGB 13.6 06/25/2016 1125   HCT 41.7 06/25/2016 1125   PLT 323 06/25/2016 1125   MCV 84.9 06/25/2016 1125   MCH 27.7 06/25/2016 1125   MCHC 32.6 06/25/2016 1125   RDW 14.3 06/25/2016 1125   LYMPHSABS 3,268 06/25/2016 1125   MONOABS 456 06/25/2016 1125   EOSABS 76 06/25/2016 1125   BASOSABS 76 06/25/2016 1125   CMP     Component Value Date/Time   NA 141 06/25/2016 1125   K 4.3 06/25/2016 1125   CL 105 06/25/2016 1125   CO2 22 06/25/2016 1125   GLUCOSE 79 06/25/2016 1125   BUN 11 06/25/2016 1125   CREATININE 0.77 06/25/2016 1125   CALCIUM 9.3 06/25/2016 1125   PROT 7.1 06/25/2016 1125   ALBUMIN 3.9 06/25/2016 1125   AST 18 06/25/2016 1125   ALT 12 06/25/2016 1125   ALKPHOS 83 06/25/2016 1125   BILITOT 0.4 06/25/2016 1125   GFRNONAA 88 06/25/2016 1125   GFRAA >89 06/25/2016 1125   TB Gold: negative (03/27/16) Hepatitis panel: negative (08/26/16) HIV: negative (522/18) SPEP: normal (08/26/16) Immunoglobulins: normal (08/26/16)  Assessment/Plan:  Counseled patient that Orencia is a selective T-cell costimulation blocker indicated for rheumatoid arthritis.  Counseled patient on purpose, proper use, and adverse effects of Orencia. The most common adverse effects are increased risk of infections, headache, and injection site reactions.  There is the possibility of an increased risk of malignancy but it is not well understood if this increased risk is due to the medication or the disease state.  Provided patient with medication education material  and answered all questions.  Patient consented to [facility].  Will upload consent into patient's chart.  Reviewed storage information for Orencia.    Maureen Chatters has been approved by patient's insurance.  Provided patient with Orencia copay card.  Patient was counseled on how to administer subcutaneous Orencia injection using a demonstration pen.  Patient was given initial Orencia injection using sample pen.  The medication was administered in the left thigh.  Lot: [id number], Exp. 10/2016.  Patient was monitored for 30 minutes post injection.  No injection site reaction noted.  Patient will need standing lab orders in one month.  Provided patient with standing lab instructions and placed standing lab order.  Patient has follow up scheduled for 12/02/16.    Elisabeth Most, Pharm.D., BCPS Clinical Pharmacist Pager: [phone] Phone: [phone] 09/02/2016 11:02 AM

## 2016-09-19 ENCOUNTER — Other Ambulatory Visit: Payer: Self-pay | Admitting: Rheumatology

## 2016-09-22 NOTE — Telephone Encounter (Signed)
ok 

## 2016-09-22 NOTE — Telephone Encounter (Signed)
Last Visit: 08/26/16 Next Visit: 12/02/16 Labs: 06/25/16 WNL  Okay to refill MTX?

## 2016-09-23 ENCOUNTER — Telehealth: Payer: Self-pay | Admitting: Rheumatology

## 2016-09-23 NOTE — Telephone Encounter (Signed)
Patient wanted to let you know that she is doing good with the medicine and that she will come get her lab work done.  FYI.

## 2016-09-25 ENCOUNTER — Ambulatory Visit: Payer: Federal, State, Local not specified - PPO | Admitting: Rheumatology

## 2016-10-03 ENCOUNTER — Other Ambulatory Visit: Payer: Self-pay | Admitting: Radiology

## 2016-10-03 ENCOUNTER — Other Ambulatory Visit: Payer: Self-pay | Admitting: *Deleted

## 2016-10-03 ENCOUNTER — Telehealth: Payer: Self-pay | Admitting: Rheumatology

## 2016-10-03 DIAGNOSIS — Z79899 Other long term (current) drug therapy: Secondary | ICD-10-CM

## 2016-10-03 NOTE — Telephone Encounter (Signed)
Lab orders faxed.

## 2016-10-03 NOTE — Telephone Encounter (Signed)
Patient at lab now. Order has expired. Please send new order. [place]

## 2016-10-10 ENCOUNTER — Telehealth: Payer: Self-pay | Admitting: Rheumatology

## 2016-10-10 NOTE — Telephone Encounter (Signed)
Patient advised she is not able to have alcohol with the MTX. Patient verbalized understanding.

## 2016-10-10 NOTE — Telephone Encounter (Signed)
Patient called wanting to know if she can have a glass of wine on her medication

## 2016-10-10 NOTE — Telephone Encounter (Signed)
Attempted to contact the patient and left message for patient to call the office.  

## 2016-10-18 ENCOUNTER — Other Ambulatory Visit: Payer: Self-pay | Admitting: Rheumatology

## 2016-10-18 LAB — CBC WITH DIFFERENTIAL/PLATELET
Basophils Absolute: 78 cells/uL (ref 0–200)
Basophils Relative: 1 %
Eosinophils Absolute: 78 cells/uL (ref 15–500)
Eosinophils Relative: 1 %
HCT: 39.6 % (ref 35.0–45.0)
Hemoglobin: 13 g/dL (ref 11.7–15.5)
Lymphocytes Relative: 37 %
Lymphs Abs: 2886 cells/uL (ref 850–3900)
MCH: 28.7 pg (ref 27.0–33.0)
MCHC: 32.8 g/dL (ref 32.0–36.0)
MCV: 87.4 fL (ref 80.0–100.0)
MPV: 10.3 fL (ref 7.5–12.5)
Monocytes Absolute: 390 cells/uL (ref 200–950)
Monocytes Relative: 5 %
Neutro Abs: 4368 cells/uL (ref 1500–7800)
Neutrophils Relative %: 56 %
Platelets: 354 10*3/uL (ref 140–400)
RBC: 4.53 MIL/uL (ref 3.80–5.10)
RDW: 14.9 % (ref 11.0–15.0)
WBC: 7.8 10*3/uL (ref 3.8–10.8)

## 2016-10-19 LAB — COMPLETE METABOLIC PANEL WITH GFR
ALT: 11 U/L (ref 6–29)
AST: 17 U/L (ref 10–35)
Albumin: 4 g/dL (ref 3.6–5.1)
Alkaline Phosphatase: 83 U/L (ref 33–130)
BUN: 18 mg/dL (ref 7–25)
CO2: 24 mmol/L (ref 20–31)
Calcium: 9.2 mg/dL (ref 8.6–10.4)
Chloride: 104 mmol/L (ref 98–110)
Creat: 0.86 mg/dL (ref 0.50–1.05)
GFR, Est African American: 89 mL/min (ref >=60)
GFR, Est Non African American: 77 mL/min (ref >=60)
Glucose, Bld: 87 mg/dL (ref 65–99)
Potassium: 4.4 mmol/L (ref 3.5–5.3)
Sodium: 139 mmol/L (ref 135–146)
Total Bilirubin: 0.3 mg/dL (ref 0.2–1.2)
Total Protein: 7.1 g/dL (ref 6.1–8.1)

## 2016-10-19 LAB — LIPID PANEL
Cholesterol: 184 mg/dL (ref <200)
HDL: 68 mg/dL (ref >50)
LDL Cholesterol: 103 mg/dL — ABNORMAL HIGH (ref <100)
Total CHOL/HDL Ratio: 2.7 Ratio (ref <5.0)
Triglycerides: 66 mg/dL (ref <150)
VLDL: 13 mg/dL (ref <30)

## 2016-10-19 NOTE — Progress Notes (Signed)
WNL, LDL mildly elevated

## 2016-11-04 ENCOUNTER — Other Ambulatory Visit: Payer: Self-pay | Admitting: Rheumatology

## 2016-11-04 NOTE — Telephone Encounter (Signed)
Last Visit: 08/26/16 Next Visit: 12/02/16 Labs: 10/18/16 WNL, LDL mildly elevated TB Gold: 03/27/16 Neg  Okay to refill per Dr. Estanislado Pandy

## 2016-11-17 ENCOUNTER — Other Ambulatory Visit: Payer: Self-pay | Admitting: *Deleted

## 2016-11-17 MED ORDER — FOLIC ACID 1 MG PO TABS: 2.0000 mg | ORAL_TABLET | Freq: Every day | ORAL | 3 refills | Status: DC

## 2016-11-17 NOTE — Telephone Encounter (Signed)
Refill request received via fax  Last Visit: 08/26/16 Next Visit: 12/02/16  Okay to refill per Dr. Estanislado Pandy

## 2016-11-27 NOTE — Progress Notes (Deleted)
Office Visit Note  Patient: [name] [name]             Date of Birth: 1960-05-08           MRN: [id number]             PCP: Kelton Pillar, MD Referring: Kelton Pillar, MD Visit Date: 12/02/2016 Occupation: @GUAROCC @    Subjective:  No chief complaint on file.   History of Present Illness: [name] [name] is a 56 y.o. female ***   Activities of Daily Living:  Patient reports morning stiffness for *** {minute/hour:19697}.   Patient {ACTIONS;DENIES/REPORTS:21021675::"Denies"} nocturnal pain.  Difficulty dressing/grooming: {ACTIONS;DENIES/REPORTS:21021675::"Denies"} Difficulty climbing stairs: {ACTIONS;DENIES/REPORTS:21021675::"Denies"} Difficulty getting out of chair: {ACTIONS;DENIES/REPORTS:21021675::"Denies"} Difficulty using hands for taps, buttons, cutlery, and/or writing: {ACTIONS;DENIES/REPORTS:21021675::"Denies"}   No Rheumatology ROS completed.   PMFS History:  Patient Active Problem List   Diagnosis Date Noted  . Status post total knee replacement, bilateral 12/01/2016  . Status post bilateral foot surgery 12/01/2016  . Contracture of right elbow 06/24/2016  . Contracture of left elbow 06/24/2016  . Rheumatoid arthritis involving multiple sites with positive rheumatoid factor ([place]) 05/27/2016  . ANA positive 05/27/2016  . Lateral epicondylitis, left elbow 05/27/2016  . Trochanteric bursitis of left hip 05/27/2016  . High risk medication use 05/27/2016  . DJD (degenerative joint disease), cervical 05/27/2016  . Osteopenia of neck of left femur 05/27/2016  . Primary osteoarthritis of both feet 05/27/2016  . S/P TAH-BSO (5/30) 09/04/2012    Past Medical History:  Diagnosis Date  . Arthritis    rheumotroid  . Complication of anesthesia    "irregular heartbeat" during neck surgery    No family history on file. Past Surgical History:  Procedure Laterality Date  . ABDOMINAL HYSTERECTOMY N/A 09/03/2012   Procedure: HYSTERECTOMY ABDOMINAL;  Surgeon:  Marvene Staff, MD;  Location: [facility];  Service: Gynecology;  Laterality: N/A;  . CERVICAL SPINE SURGERY    . FOOT SURGERY Bilateral   . MEDIAL PARTIAL KNEE REPLACEMENT Bilateral   . SALPINGOOPHORECTOMY Bilateral 09/03/2012   Procedure: SALPINGO OOPHORECTOMY;  Surgeon: Marvene Staff, MD;  Location: [facility];  Service: Gynecology;  Laterality: Bilateral;  . TONSILLECTOMY     Social History   Social History Narrative  . No narrative on file     Objective: Vital Signs: There were no vitals taken for this visit.   Physical Exam   Musculoskeletal Exam: ***  CDAI Exam: No CDAI exam completed.    Investigation: Findings:  03/27/2016 negative TB gold  CBC Latest Ref Rng & Units 10/18/2016 06/25/2016 03/27/2016  WBC 3.8 - 10.8 K/uL 7.8 7.6 6.5  Hemoglobin 11.7 - 15.5 g/dL 13.0 13.6 13.3  Hematocrit 35.0 - 45.0 % 39.6 41.7 40.5  Platelets 140 - 400 K/uL 354 323 276   CMP Latest Ref Rng & Units 10/18/2016 06/25/2016 03/27/2016  Glucose 65 - 99 mg/dL 87 79 96  BUN 7 - 25 mg/dL 18 11 13   Creatinine 0.50 - 1.05 mg/dL 0.86 0.77 0.80  Sodium 135 - 146 mmol/L 139 141 140  Potassium 3.5 - 5.3 mmol/L 4.4 4.3 4.1  Chloride 98 - 110 mmol/L 104 105 105  CO2 20 - 31 mmol/L 24 22 25   Calcium 8.6 - 10.4 mg/dL 9.2 9.3 9.1  Total Protein 6.1 - 8.1 g/dL 7.1 7.1 6.5  Total Bilirubin 0.2 - 1.2 mg/dL 0.3 0.4 0.6  Alkaline Phos 33 - 130 U/L 83 83 89  AST 10 - 35 U/L 17 18  18  ALT 6 - 29 U/L 11 12 12     Imaging: No results found.  Speciality Comments: No specialty comments available.    Procedures:  No procedures performed Allergies: Sulfa antibiotics; Morphine and related; and Penicillins   Assessment / Plan:     Visit Diagnoses: Rheumatoid arthritis involving multiple sites with positive rheumatoid factor (HCC) - Positive RF, positive anti-CCP, positive ANA, severe disease multiple contractures.  High risk medication use - sub q Orencia and Methotrexate injectable 0.7 mL  subcutaneous every week, folic acid 2 mg by mouth daily  DJD (degenerative joint disease), cervical - status post fusion  Contracture of left elbow  Contracture of right elbow  Status post total knee replacement, bilateral  Status post bilateral foot surgery - Bilateral feet reconstruction due to severe rheumatoid arthritis  Lateral epicondylitis, left elbow  Trochanteric bursitis of left hip  ANA positive  Osteopenia of neck of left femur - DEXA 10/05/2014 Tscore left femoral neck -1.6     Orders: No orders of the defined types were placed in this encounter.  No orders of the defined types were placed in this encounter.   Face-to-face time spent with patient was *** minutes. 50% of time was spent in counseling and coordination of care.  Follow-Up Instructions: No Follow-up on file.   Bo Merino, MD  Note - This record has been created using Editor, commissioning.  Chart creation errors have been sought, but may not always  have been located. Such creation errors do not reflect on  the standard of medical care.

## 2016-12-01 DIAGNOSIS — Z96653 Presence of artificial knee joint, bilateral: Secondary | ICD-10-CM | POA: Insufficient documentation

## 2016-12-01 DIAGNOSIS — Z9889 Other specified postprocedural states: Secondary | ICD-10-CM | POA: Insufficient documentation

## 2016-12-02 ENCOUNTER — Ambulatory Visit: Payer: Medicare Other | Admitting: Rheumatology

## 2017-01-28 ENCOUNTER — Telehealth: Payer: Self-pay

## 2017-01-28 NOTE — Telephone Encounter (Signed)
Patient would like to know if she could get a doctor's note for missing class today due to right arm pain.  Cb# is [phone].  Please advise.

## 2017-01-28 NOTE — Telephone Encounter (Signed)
Okay to give a note

## 2017-01-30 ENCOUNTER — Encounter: Payer: Self-pay | Admitting: *Deleted

## 2017-01-30 NOTE — Telephone Encounter (Signed)
Letter written for patient and placed at front office for patient to pick up.

## 2017-02-03 NOTE — Progress Notes (Signed)
Office Visit Note  Patient: [name] [name]             Date of Birth: 26-Jul-1960           MRN: [id number]             PCP: Kelton Pillar, MD Referring: Kelton Pillar, MD Visit Date: 02/06/2017 Occupation: @GUAROCC @    Subjective:  Joint stiffness   History of Present Illness: [name] [name] is a 56 y.o. female with history of rheumatoid arthritis and osteoarthritis overlap. She states that her arthritis is fairly well controlled on current combination of medications. She recently had some joint pain in her right elbow which is better now. She's been also experiencing some pain in her left groin. A bilateral totally replacement is doing quite well. Her neck is doing okay. She has some lower back pain off and on.  Activities of Daily Living:  Patient reports morning stiffness for 0 minute.   Patient Denies nocturnal pain.  Difficulty dressing/grooming: Denies Difficulty climbing stairs: Denies Difficulty getting out of chair: Denies Difficulty using hands for taps, buttons, cutlery, and/or writing: Reports   Review of Systems  Constitutional: Negative for night sweats, weight gain, weight loss and weakness.  HENT: Negative for mouth sores, trouble swallowing, trouble swallowing, mouth dryness and nose dryness.   Eyes: Negative for pain, redness, visual disturbance and dryness.  Respiratory: Negative for cough, shortness of breath and difficulty breathing.   Cardiovascular: Negative for chest pain, palpitations, hypertension, irregular heartbeat and swelling in legs/feet.  Gastrointestinal: Negative for blood in stool, constipation and diarrhea.  Endocrine: Negative for increased urination.  Genitourinary: Negative for vaginal dryness.  Musculoskeletal: Positive for arthralgias and joint pain. Negative for joint swelling, myalgias, muscle weakness, morning stiffness, muscle tenderness and myalgias.  Skin: Negative for color change, rash, hair loss, skin tightness, ulcers  and sensitivity to sunlight.  Allergic/Immunologic: Negative for susceptible to infections.  Neurological: Positive for tremors. Negative for dizziness, memory loss and night sweats.  Hematological: Negative for swollen glands.  Psychiatric/Behavioral: Negative for depressed mood and sleep disturbance. The patient is not nervous/anxious.     PMFS History:  Patient Active Problem List   Diagnosis Date Noted  . Status post total knee replacement, bilateral 12/01/2016  . Status post bilateral foot surgery 12/01/2016  . Contracture of right elbow 06/24/2016  . Contracture of left elbow 06/24/2016  . Rheumatoid arthritis involving multiple sites with positive rheumatoid factor ([place]) 05/27/2016  . ANA positive 05/27/2016  . Lateral epicondylitis, left elbow 05/27/2016  . Trochanteric bursitis of left hip 05/27/2016  . High risk medication use 05/27/2016  . DJD (degenerative joint disease), cervical 05/27/2016  . Osteopenia of neck of left femur 05/27/2016  . Primary osteoarthritis of both feet 05/27/2016  . S/P TAH-BSO (5/30) 09/04/2012    Past Medical History:  Diagnosis Date  . Arthritis    rheumotroid  . Complication of anesthesia    "irregular heartbeat" during neck surgery    Family History  Problem Relation Age of Onset  . Cancer Mother   . Cancer Father   . Cancer Brother   . Seizures Brother    Past Surgical History:  Procedure Laterality Date  . ABDOMINAL HYSTERECTOMY N/A 09/03/2012   Procedure: HYSTERECTOMY ABDOMINAL;  Surgeon: Marvene Staff, MD;  Location: [facility];  Service: Gynecology;  Laterality: N/A;  . CERVICAL SPINE SURGERY    . FOOT SURGERY Bilateral   . JOINT REPLACEMENT     BIL knees   .  KNEE ARTHROPLASTY    . MEDIAL PARTIAL KNEE REPLACEMENT Bilateral   . SALPINGOOPHORECTOMY Bilateral 09/03/2012   Procedure: SALPINGO OOPHORECTOMY;  Surgeon: Marvene Staff, MD;  Location: [facility];  Service: Gynecology;  Laterality: Bilateral;  . TONSILLECTOMY       Social History   Social History Narrative  . No narrative on file     Objective: Vital Signs: BP 103/75 (BP Location: Left Arm, Patient Position: Sitting, Cuff Size: Normal)   Pulse 62   Resp 15   Ht 5\' 2"  (1.575 m)   Wt 169 lb (76.7 kg)   BMI 30.91 kg/m    Physical Exam  Constitutional: She is oriented to person, place, and time. She appears well-developed and well-nourished.  HENT:  Head: Normocephalic and atraumatic.  Eyes: Conjunctivae and EOM are normal.  Neck: Normal range of motion.  Cardiovascular: Normal rate, regular rhythm, normal heart sounds and intact distal pulses.   Pulmonary/Chest: Effort normal and breath sounds normal.  Abdominal: Soft. Bowel sounds are normal.  Lymphadenopathy:    She has no cervical adenopathy.  Neurological: She is alert and oriented to person, place, and time.  Skin: Skin is warm and dry. Capillary refill takes less than 2 seconds.  Psychiatric: She has a normal mood and affect. Her behavior is normal.  Nursing note and vitals reviewed.    Musculoskeletal Exam: C-spine very limited range of motion. Shoulder joints are good range of motion. She has bilateral elbow joint contractures. She has synovial thickening over wrist joint MCPs joints and PIP joints with no active synovitis. She has subluxation of most of her MCP joints. Hip joints knee joints ankles were good range of motion. She has some MTP changes due to rheumatoid arthritis. Her bilateral total knee replacements are doing well.  CDAI Exam: CDAI Homunculus Exam:   Joint Counts:  CDAI Tender Joint count: 0 CDAI Swollen Joint count: 0  Global Assessments:  Patient Global Assessment: 2 Provider Global Assessment: 2  CDAI Calculated Score: 4    Investigation: No additional findings.TB Gold: 03/2016 Negative  CBC Latest Ref Rng & Units 10/18/2016 06/25/2016 03/27/2016  WBC 3.8 - 10.8 K/uL 7.8 7.6 6.5  Hemoglobin 11.7 - 15.5 g/dL 13.0 13.6 13.3  Hematocrit 35.0 - 45.0  % 39.6 41.7 40.5  Platelets 140 - 400 K/uL 354 323 276   CMP Latest Ref Rng & Units 10/18/2016 06/25/2016 03/27/2016  Glucose 65 - 99 mg/dL 87 79 96  BUN 7 - 25 mg/dL 18 11 13   Creatinine 0.50 - 1.05 mg/dL 0.86 0.77 0.80  Sodium 135 - 146 mmol/L 139 141 140  Potassium 3.5 - 5.3 mmol/L 4.4 4.3 4.1  Chloride 98 - 110 mmol/L 104 105 105  CO2 20 - 31 mmol/L 24 22 25   Calcium 8.6 - 10.4 mg/dL 9.2 9.3 9.1  Total Protein 6.1 - 8.1 g/dL 7.1 7.1 6.5  Total Bilirubin 0.2 - 1.2 mg/dL 0.3 0.4 0.6  Alkaline Phos 33 - 130 U/L 83 83 89  AST 10 - 35 U/L 17 18 18   ALT 6 - 29 U/L 11 12 12     Imaging: No results found.  Speciality Comments: No specialty comments available.    Procedures:  No procedures performed Allergies: Sulfa antibiotics; Morphine and related; and Penicillins   Assessment / Plan:     Visit Diagnoses: Rheumatoid arthritis involving multiple sites with positive rheumatoid factor (HCC) - +RF, +CCP, +ANA, erosive. Patient has severe disease which is very well controlled on current combination  of medication. She had no active synovitis on examination. She has a lot of limitations due to previous damage to her joints.  High risk medication use - Orencia sq q wk, MTX 0.7 sq q wk, folic acid 2mg  po qd - Plan: CBC with Differential/Platelet, COMPLETE METABOLIC PANEL WITH GFR, Quantiferon tb gold assay (blood) today and then CBC and CMP will be required every 3 months to monitor for drug toxicity.  Contracture of  bilateral elbows: She had recent discomfort in her right elbow. Which is resolved now. She had no no synovitis on exam.  Status post total knee replacement, bilateral: Doing well  DDD (degenerative disc disease), cervical - She has very limited range of motion  Osteopenia of multiple sites - Plan: VITAMIN D 25 Hydroxy (Vit-D Deficiency, Fractures) I've advised her to schedule follow-up DEXA with her PCP.  Left lower quadrant pain: She has had hysterectomy in the past. Have  advised to follow up with her GYN. She had good range of motion of her left hip joint without any discomfort.   Orders: Orders Placed This Encounter  Procedures  . CBC with Differential/Platelet  . COMPLETE METABOLIC PANEL WITH GFR  . Quantiferon tb gold assay (blood)  . VITAMIN D 25 Hydroxy (Vit-D Deficiency, Fractures)   No orders of the defined types were placed in this encounter.   Face-to-face time spent with patient was 30 minutes. Greater than 50% of time was spent in counseling and coordination of care.  Follow-Up Instructions: Return in about 5 months (around 07/07/2017) for RA , OA.   Bo Merino, MD  Note - This record has been created using Editor, commissioning.  Chart creation errors have been sought, but may not always  have been located. Such creation errors do not reflect on  the standard of medical care.

## 2017-02-06 ENCOUNTER — Encounter: Payer: Self-pay | Admitting: Rheumatology

## 2017-02-06 ENCOUNTER — Ambulatory Visit (INDEPENDENT_AMBULATORY_CARE_PROVIDER_SITE_OTHER): Payer: No Typology Code available for payment source | Admitting: Rheumatology

## 2017-02-06 VITALS — BP 103/75 | HR 62 | Resp 15 | Ht 62.0 in | Wt 169.0 lb

## 2017-02-06 DIAGNOSIS — M0579 Rheumatoid arthritis with rheumatoid factor of multiple sites without organ or systems involvement: Secondary | ICD-10-CM | POA: Diagnosis not present

## 2017-02-06 DIAGNOSIS — M5030 Other cervical disc degeneration, unspecified cervical region: Secondary | ICD-10-CM

## 2017-02-06 DIAGNOSIS — Z79899 Other long term (current) drug therapy: Secondary | ICD-10-CM

## 2017-02-06 DIAGNOSIS — Z96653 Presence of artificial knee joint, bilateral: Secondary | ICD-10-CM | POA: Diagnosis not present

## 2017-02-06 DIAGNOSIS — M8589 Other specified disorders of bone density and structure, multiple sites: Secondary | ICD-10-CM | POA: Diagnosis not present

## 2017-02-06 DIAGNOSIS — M24521 Contracture, right elbow: Secondary | ICD-10-CM | POA: Diagnosis not present

## 2017-02-06 DIAGNOSIS — M24522 Contracture, left elbow: Secondary | ICD-10-CM | POA: Diagnosis not present

## 2017-02-06 NOTE — Patient Instructions (Signed)
Standing Labs We placed an order today for your standing lab work.    Please come back and get your standing labs in February and every 3 months  We have open lab Monday through Friday from 8:30-11:30 AM and 1:30-4 PM at the office of Dr. Eain Mullendore.   The office is located at [address], [address], [place], [place] [address] No appointment is necessary.   Labs are drawn by [facility].  You may receive a bill from [facility] for your lab work. If you have any questions regarding directions or hours of operation,  please call [phone].    

## 2017-02-08 LAB — CBC WITH DIFFERENTIAL/PLATELET
Basophils Absolute: 101 cells/uL (ref 0–200)
Basophils Relative: 1.5 %
Eosinophils Absolute: 87 cells/uL (ref 15–500)
Eosinophils Relative: 1.3 %
HCT: 43.5 % (ref 35.0–45.0)
Hemoglobin: 14.5 g/dL (ref 11.7–15.5)
Lymphs Abs: 2533 cells/uL (ref 850–3900)
MCH: 27.6 pg (ref 27.0–33.0)
MCHC: 33.3 g/dL (ref 32.0–36.0)
MCV: 82.7 fL (ref 80.0–100.0)
MPV: 11.1 fL (ref 7.5–12.5)
Monocytes Relative: 4.3 %
Neutro Abs: 3692 cells/uL (ref 1500–7800)
Neutrophils Relative %: 55.1 %
Platelets: 319 10*3/uL (ref 140–400)
RBC: 5.26 10*6/uL — ABNORMAL HIGH (ref 3.80–5.10)
RDW: 13 % (ref 11.0–15.0)
Total Lymphocyte: 37.8 %
WBC mixed population: 288 cells/uL (ref 200–950)
WBC: 6.7 10*3/uL (ref 3.8–10.8)

## 2017-02-08 LAB — QUANTIFERON TB GOLD ASSAY (BLOOD)
Mitogen-Nil: >10 IU/mL
QUANTIFERON(R)-TB GOLD: NEGATIVE
Quantiferon Nil Value: 0.09 IU/mL
Quantiferon Tb Ag Minus Nil Value: <0 IU/mL

## 2017-02-08 LAB — COMPLETE METABOLIC PANEL WITH GFR
AG Ratio: 1.2 (calc) (ref 1.0–2.5)
ALT: 15 U/L (ref 6–29)
AST: 23 U/L (ref 10–35)
Albumin: 4.1 g/dL (ref 3.6–5.1)
Alkaline phosphatase (APISO): 97 U/L (ref 33–130)
BUN: 9 mg/dL (ref 7–25)
CO2: 18 mmol/L — ABNORMAL LOW (ref 20–32)
Calcium: 9.6 mg/dL (ref 8.6–10.4)
Chloride: 107 mmol/L (ref 98–110)
Creat: 0.77 mg/dL (ref 0.50–1.05)
GFR, Est African American: 102 mL/min/{1.73_m2} (ref >=60)
GFR, Est Non African American: 88 mL/min/{1.73_m2} (ref >=60)
Globulin: 3.5 g/dL (calc) (ref 1.9–3.7)
Glucose, Bld: 82 mg/dL (ref 65–99)
Potassium: 4.7 mmol/L (ref 3.5–5.3)
Sodium: 139 mmol/L (ref 135–146)
Total Bilirubin: 0.5 mg/dL (ref 0.2–1.2)
Total Protein: 7.6 g/dL (ref 6.1–8.1)

## 2017-02-08 LAB — VITAMIN D 25 HYDROXY (VIT D DEFICIENCY, FRACTURES): Vit D, 25-Hydroxy: 35 ng/mL (ref 30–100)

## 2017-02-08 NOTE — Progress Notes (Signed)
WNL

## 2017-02-12 ENCOUNTER — Other Ambulatory Visit: Payer: Self-pay | Admitting: Rheumatology

## 2017-02-12 ENCOUNTER — Telehealth: Payer: Self-pay | Admitting: Rheumatology

## 2017-02-12 NOTE — Telephone Encounter (Signed)
Tb Gold printed and placed at the front for patient pick up.

## 2017-02-12 NOTE — Telephone Encounter (Signed)
Last Visit: 02/06/17 Next Visit: 07/14/17 Labs: 02/06/17 WNL TB Gold: 02/06/17 Neg  Okay to refill per Dr. Estanislado Pandy

## 2017-02-12 NOTE — Telephone Encounter (Signed)
Patient needs TB gold lab results to take to work. Patient coming by today around lunch time to pick up. Please call patient with concerns.

## 2017-04-08 ENCOUNTER — Telehealth: Payer: Self-pay

## 2017-04-08 NOTE — Telephone Encounter (Signed)
Received a letter from [facility] stating that the pts Rx for Orencia will be subject to quantity limits under the Specialty Quantity Limit Prgram starting 04/07/17.   Called [facility] to clarify. Spoke with [employer]. Who states that Maureen Chatters is approved for 500mg s per month. Pts is receiving Orencia Clickjet 125mg  weekly. As long as the dose staying within the limits pt will have no problems filling her Rx.   Will send document to scan center.  Arville Postlewaite, [place], CPhT 12:26 PM

## 2017-04-27 ENCOUNTER — Telehealth: Payer: Self-pay | Admitting: Rheumatology

## 2017-04-27 DIAGNOSIS — R1032 Left lower quadrant pain: Secondary | ICD-10-CM

## 2017-04-27 NOTE — Telephone Encounter (Signed)
Patient called statiing that Dr. Estanislado Pandy recommended that she Dr. Janeann Merl (gynecologist) but she was unable to make an appointment without a referral.  Patient's CB# [phone]

## 2017-04-28 NOTE — Telephone Encounter (Signed)
Referral has been placed. 

## 2017-04-30 ENCOUNTER — Telehealth: Payer: Self-pay | Admitting: Obstetrics & Gynecology

## 2017-04-30 NOTE — Telephone Encounter (Signed)
Called and left a message for patient to call back to schedule a new patient doctor referral appointment with our office to see Dr. Sabra Heck for: Left lower quadrant pain.

## 2017-05-04 ENCOUNTER — Encounter: Payer: Self-pay | Admitting: Obstetrics & Gynecology

## 2017-05-04 ENCOUNTER — Telehealth: Payer: Self-pay | Admitting: *Deleted

## 2017-05-04 ENCOUNTER — Other Ambulatory Visit: Payer: Self-pay

## 2017-05-04 ENCOUNTER — Ambulatory Visit: Payer: No Typology Code available for payment source | Admitting: Obstetrics & Gynecology

## 2017-05-04 ENCOUNTER — Other Ambulatory Visit: Payer: Self-pay | Admitting: Obstetrics & Gynecology

## 2017-05-04 VITALS — BP 126/86 | HR 58 | Resp 16 | Ht 62.0 in | Wt 173.0 lb

## 2017-05-04 DIAGNOSIS — R102 Pelvic and perineal pain: Secondary | ICD-10-CM

## 2017-05-04 DIAGNOSIS — N3281 Overactive bladder: Secondary | ICD-10-CM | POA: Diagnosis not present

## 2017-05-04 DIAGNOSIS — Z1231 Encounter for screening mammogram for malignant neoplasm of breast: Secondary | ICD-10-CM

## 2017-05-04 LAB — POCT URINALYSIS DIPSTICK
Bilirubin, UA: NEGATIVE
Glucose, UA: NEGATIVE
Ketones, UA: NEGATIVE
Nitrite, UA: NEGATIVE
Protein, UA: NEGATIVE
Urobilinogen, UA: 0.2 E.U./dL
pH, UA: 5 (ref 5.0–8.0)

## 2017-05-04 NOTE — Telephone Encounter (Signed)
Call to patient. Advised patient had spoken with Pharmacist at [facility] in [place] and they can compound the vitamin e suppositories for $65 for quantity of 36. Patient requests prescription go to [facility] for cost saving purposes. Patient states she is in [place] frequently and it won't be a problem for her to go to [place]. RN advised would update Dr. Sabra Heck.

## 2017-05-04 NOTE — Progress Notes (Signed)
Triage RN spoke with [facility] in [place] who states they currently do not do 3D MMG. Patient requesting to schedule at location in [place]. Patient scheduled for 3D MMG on 05/06/17 at 1140 at the [place]. Patient in office when scheduled and agreeable to date and time of appointment.

## 2017-05-04 NOTE — Progress Notes (Signed)
GYNECOLOGY  VISIT  CC:   Dull aching pelvic pain x 4 months  HPI: 57 y.o. G33P1011 Divorced  female here for new patient appt for complaint of pelvic pain.  Reports this has been ongoing since September.  No sharp pain.  Not focal.  States she has an immature 32 yo son and she has to be a healthy as she can be for him.  Just wants to know this is nothing worrisome.    H/o fibroid uterus with menorrhagia.  Underwent TAH/BSO 09/03/12 with Dr. Garwin Brothers.  Pathology showed fibroids.  Did not have this pain after surgery.  Never went back for follow-up.  States she didn't like the interaction she had with the provider.  Has not had any gynecological care since then..   Does not typically have hot flashes but will have some hot flashes is she drinks too much caffeine.  Never took HRT.  Did have ovaries removed due to mother's hx of ovarian cancer.  Not currently SA.  Did have a partner last year but intercourse did not cause pain to worsen.  Has OAB and does have some mild urinary leakage when she has severe urgency.  Has normal bowel function--typically has a normal bowel movement every day.  Was diagnosed with juvenile RA when she was 58.  Had symptoms for about two years before diagnosis.     Does yearly mammograms in [place].  Has not done 3D.  Would like to do this.  Has strong family hx of cancers.    GYNECOLOGIC HISTORY: Patient's last menstrual period was 04/07/2012. Contraception: hysterectomy  Menopausal hormone therapy: none  MMG:  Does yearly.  Doing at [facility] Colonoscopy:  6/18.  Dr. Collene Mares.  Follow up 5 years.  Patient Active Problem List   Diagnosis Date Noted  . Status post total knee replacement, bilateral 12/01/2016  . Status post bilateral foot surgery 12/01/2016  . Contracture of right elbow 06/24/2016  . Contracture of left elbow 06/24/2016  . Rheumatoid arthritis involving multiple sites with positive rheumatoid factor ([place]) 05/27/2016  . ANA positive  05/27/2016  . Lateral epicondylitis, left elbow 05/27/2016  . Trochanteric bursitis of left hip 05/27/2016  . High risk medication use 05/27/2016  . DJD (degenerative joint disease), cervical 05/27/2016  . Osteopenia of neck of left femur 05/27/2016  . Primary osteoarthritis of both feet 05/27/2016  . S/P TAH-BSO (5/30) 09/04/2012    Past Medical History:  Diagnosis Date  . Arthritis    rheumotroid  . Complication of anesthesia    "irregular heartbeat" during neck surgery  . Fibroid   . Rheumatoid arteritis    age 40  . Urinary incontinence     Past Surgical History:  Procedure Laterality Date  . ABDOMINAL HYSTERECTOMY N/A 09/03/2012   Procedure: HYSTERECTOMY ABDOMINAL;  Surgeon: Marvene Staff, MD;  Location: [facility];  Service: Gynecology;  Laterality: N/A;  . CERVICAL SPINE SURGERY    . FOOT SURGERY Bilateral   . JOINT REPLACEMENT     BIL knees   . KNEE ARTHROPLASTY    . MEDIAL PARTIAL KNEE REPLACEMENT Bilateral   . SALPINGOOPHORECTOMY Bilateral 09/03/2012   Procedure: SALPINGO OOPHORECTOMY;  Surgeon: Marvene Staff, MD;  Location: [facility];  Service: Gynecology;  Laterality: Bilateral;  . TONSILLECTOMY      MEDS:   Current Outpatient Medications on File Prior to Visit  Medication Sig Dispense Refill  . acetaminophen (TYLENOL) 500 MG tablet Take 1,000 mg by mouth every 6 (six)  hours as needed for pain.    Marland Kitchen b complex vitamins capsule Take 1 capsule by mouth daily.    . cholecalciferol (VITAMIN D) 1000 units tablet Take 1,000 Units by mouth daily.    . citalopram (CELEXA) 20 MG tablet Take 1 tablet (20 mg total) by mouth daily. 30 tablet 5  . folic acid (FOLVITE) 1 MG tablet Take 2 tablets (2 mg total) by mouth daily. 180 tablet 3  . Methotrexate Sodium (METHOTREXATE, PF,) 50 MG/2ML injection INJECT 0.7 MILLILITERS INTO THE SKIN ONCE A WEEK 10 mL 0  . ORENCIA CLICKJECT 381 MG/ML SOAJ INJECT ONE CLICKJECT PEN (829 MG) SUBCUTANEOUSLY EVERY WEEK. REFRIGERATE. ALLOW  TO WARM TO ROOM TEMPERATURE PRIOR TO ADMINISTRATION. 12 Syringe 0  . Probiotic Product (PROBIOTIC-10) CAPS Take by mouth daily.    Marland Kitchen tolterodine (DETROL LA) 4 MG 24 hr capsule Take 4 mg by mouth daily.  1  . TUBERCULIN SYR 1CC/27GX1/2" 27G X 1/2" 1 ML MISC Once a week with methotrexate 12 each 4   No current facility-administered medications on file prior to visit.     ALLERGIES: Sulfa antibiotics; Morphine and related; and Penicillins  Family History  Problem Relation Age of Onset  . Cancer Mother   . Ovarian cancer Mother   . Cancer Father   . Lung cancer Father   . Cancer Brother        Appendix  . Seizures Brother   . Colon cancer Maternal Aunt   . Breast cancer Paternal Aunt   . Colon cancer Maternal Grandmother   . Stroke Paternal Grandmother   . Heart disease Paternal Grandmother   . Breast cancer Paternal Aunt     SH:  Divorced, non smoker  Review of Systems  Gastrointestinal: Positive for abdominal pain.  All other systems reviewed and are negative.   PHYSICAL EXAMINATION:    BP 126/86 (BP Location: Right Arm, Patient Position: Sitting, Cuff Size: Large)   Pulse (!) 58   Resp 16   Ht 5\' 2"  (1.575 m)   Wt 173 lb (78.5 kg)   LMP 04/07/2012   BMI 31.64 kg/m     General appearance: alert, cooperative and appears stated age Neck: no adenopathy, supple, symmetrical, trachea midline and thyroid normal to inspection and palpation CV:  Regular rate and rhythm Lungs:  clear to auscultation, no wheezes, rales or rhonchi, symmetric air entry Abdomen: soft, non-tender; bowel sounds normal; no masses,  no organomegaly  Pelvic: External genitalia:  no lesions              Urethra:  normal appearing urethra with no masses, tenderness or lesions              Bartholins and Skenes: normal                 Vagina: atrophic vaginal changes noted with normal colo, no discharge, no lesions              Cervix: absent              Bimanual Exam:  Uterus:  uterus absent               Adnexa: no mass, fullness, tenderness              Rectovaginal: Yes.  .  Confirms.              Anus:  normal sphincter tone, no lesions  Chaperone was present for exam.  Assessment: Vaginal atrophic  changes, otherwise normal exam H/O TAH/BSO 5/14 OAB Rheumatoid arthritis  Plan: Will treat with vit E vaginal suppositories, 200u/ml.  One pv three times weekly.  rx to pharmacy.  Recheck 3 months.  Do not find anything on exam that would warrant pelvic imaging.  Pt very comfortable with this plan. Will help pt scheduled 3D MMG Urine culture pending   ~30 minutes spent with patient >50% of time was in face to face discussion of above.

## 2017-05-05 ENCOUNTER — Other Ambulatory Visit: Payer: Self-pay | Admitting: Obstetrics & Gynecology

## 2017-05-05 LAB — URINE CULTURE

## 2017-05-05 MED ORDER — NONFORMULARY OR COMPOUNDED ITEM: 3 refills | Status: DC

## 2017-05-05 NOTE — Progress Notes (Signed)
rx for Vit E vaginal suppositories written.

## 2017-05-05 NOTE — Telephone Encounter (Signed)
Rx done.  Ok to fax to [facility].

## 2017-05-06 ENCOUNTER — Ambulatory Visit
Admission: RE | Admit: 2017-05-06 | Discharge: 2017-05-06 | Disposition: A | Discharge status: Home / Self Care | Payer: Medicare Other | Source: Ambulatory Visit | Attending: Obstetrics & Gynecology | Admitting: Obstetrics & Gynecology

## 2017-05-06 DIAGNOSIS — Z1231 Encounter for screening mammogram for malignant neoplasm of breast: Secondary | ICD-10-CM

## 2017-05-07 ENCOUNTER — Ambulatory Visit: Payer: No Typology Code available for payment source | Admitting: Rheumatology

## 2017-05-07 ENCOUNTER — Other Ambulatory Visit: Payer: Self-pay | Admitting: Rheumatology

## 2017-05-07 NOTE — Telephone Encounter (Signed)
Prescription for vitamin E vaginal suppositories 200u/mL, #36, 3RF faxed to [facility]. Fax #: [phone].

## 2017-05-07 NOTE — Telephone Encounter (Signed)
Last Visit: 02/06/17 Next Visit: 07/14/17 Labs: 02/06/17 WNL TB Gold: 02/06/17 Neg  Okay to refill per Dr. Estanislado Pandy

## 2017-05-18 ENCOUNTER — Telehealth: Payer: Self-pay | Admitting: Rheumatology

## 2017-05-18 ENCOUNTER — Other Ambulatory Visit: Payer: Self-pay

## 2017-05-18 DIAGNOSIS — Z79899 Other long term (current) drug therapy: Secondary | ICD-10-CM

## 2017-05-18 LAB — COMPLETE METABOLIC PANEL WITH GFR
AG Ratio: 1.2 (calc) (ref 1.0–2.5)
ALT: 13 U/L (ref 6–29)
AST: 18 U/L (ref 10–35)
Albumin: 4.2 g/dL (ref 3.6–5.1)
Alkaline phosphatase (APISO): 89 U/L (ref 33–130)
BUN: 11 mg/dL (ref 7–25)
CO2: 28 mmol/L (ref 20–32)
Calcium: 9.9 mg/dL (ref 8.6–10.4)
Chloride: 103 mmol/L (ref 98–110)
Creat: 0.74 mg/dL (ref 0.50–1.05)
GFR, Est African American: 106 mL/min/{1.73_m2} (ref >=60)
GFR, Est Non African American: 92 mL/min/{1.73_m2} (ref >=60)
Globulin: 3.4 g/dL (calc) (ref 1.9–3.7)
Glucose, Bld: 80 mg/dL (ref 65–99)
Potassium: 4.4 mmol/L (ref 3.5–5.3)
Sodium: 140 mmol/L (ref 135–146)
Total Bilirubin: 0.5 mg/dL (ref 0.2–1.2)
Total Protein: 7.6 g/dL (ref 6.1–8.1)

## 2017-05-18 LAB — CBC WITH DIFFERENTIAL/PLATELET
Basophils Absolute: 59 cells/uL (ref 0–200)
Basophils Relative: 0.8 %
Eosinophils Absolute: 141 cells/uL (ref 15–500)
Eosinophils Relative: 1.9 %
HCT: 42.1 % (ref 35.0–45.0)
Hemoglobin: 13.8 g/dL (ref 11.7–15.5)
Lymphs Abs: 3034 cells/uL (ref 850–3900)
MCH: 27.2 pg (ref 27.0–33.0)
MCHC: 32.8 g/dL (ref 32.0–36.0)
MCV: 83 fL (ref 80.0–100.0)
MPV: 11.3 fL (ref 7.5–12.5)
Monocytes Relative: 4.1 %
Neutro Abs: 3863 cells/uL (ref 1500–7800)
Neutrophils Relative %: 52.2 %
Platelets: 294 10*3/uL (ref 140–400)
RBC: 5.07 10*6/uL (ref 3.80–5.10)
RDW: 13.4 % (ref 11.0–15.0)
Total Lymphocyte: 41 %
WBC mixed population: 303 cells/uL (ref 200–950)
WBC: 7.4 10*3/uL (ref 3.8–10.8)

## 2017-05-18 NOTE — Telephone Encounter (Signed)
Returned patient's call and advised patient she is due for labs every 3 months and is due this month. Patient verbalized understanding and will be coming in this week to have labs drawn.

## 2017-05-18 NOTE — Addendum Note (Signed)
Addended by: Nena Jordan on: 05/18/2017 01:11 PM   Modules accepted: Orders

## 2017-05-18 NOTE — Telephone Encounter (Signed)
Patient called to check when she is due for labs.  Patient states that she was not sure if it was every four months or every six months.

## 2017-06-22 ENCOUNTER — Telehealth: Payer: Self-pay | Admitting: Rheumatology

## 2017-06-22 NOTE — Telephone Encounter (Signed)
Patient called stating that she has a "bad cold" and is currently taking Methotrexate, folic acid, and Orencia.  Patient has been taking Musinex DM but it isn't helping.  Patient is requesting an antibiotic be called into [facility] in [place].

## 2017-06-22 NOTE — Telephone Encounter (Signed)
Patient advised she would need to contact her PCP or urgent care for evaluation and treatment. Patient advised she would need to hold her Orencia and her MTX. Patient verbalized understanding.

## 2017-07-01 NOTE — Progress Notes (Signed)
Office Visit Note  Patient: [name] [name]             Date of Birth: [birth year]           MRN: [id number]             PCP: Kelton Pillar, MD Referring: Kelton Pillar, MD Visit Date: 07/14/2017 Occupation: @GUAROCC @    Subjective:  Medication Management   History of Present Illness: [name] [name] is a 57 y.o. female with history of rheumatoid arthritis.  Patient reports that she has severe upper respiratory tract infection for which she had to stop her medications.  She was treated with doxycycline.  She states that she took Mucinex after that.  Her infection has resolved and she has resumed Orencia.  She denies any joint pain or joint swelling.  Her bilateral total knee replacements are doing well.  Does not have much discomfort in her C-spine currently.   Activities of Daily Living:  Patient reports morning stiffness for 2 minutes.   Patient Denies nocturnal pain.  Difficulty dressing/grooming: Denies Difficulty climbing stairs: Reports Difficulty getting out of chair: Denies Difficulty using hands for taps, buttons, cutlery, and/or writing: Denies   Review of Systems  Constitutional: Positive for fatigue. Negative for night sweats, weight gain and weight loss.  HENT: Negative for mouth sores, trouble swallowing, trouble swallowing, mouth dryness and nose dryness.   Eyes: Negative for pain, redness, visual disturbance and dryness.  Respiratory: Negative for cough, shortness of breath and difficulty breathing.   Cardiovascular: Negative for chest pain, palpitations, hypertension, irregular heartbeat and swelling in legs/feet.  Gastrointestinal: Negative for blood in stool, constipation and diarrhea.  Endocrine: Negative for increased urination.  Genitourinary: Negative for vaginal dryness.  Musculoskeletal: Positive for arthralgias, joint pain and morning stiffness. Negative for joint swelling, myalgias, muscle weakness, muscle tenderness and myalgias.  Skin: Negative  for color change, rash, hair loss, skin tightness, ulcers and sensitivity to sunlight.  Allergic/Immunologic: Negative for susceptible to infections.  Neurological: Negative for dizziness, memory loss, night sweats and weakness.  Hematological: Negative for swollen glands.  Psychiatric/Behavioral: Negative for depressed mood and sleep disturbance. The patient is not nervous/anxious.     PMFS History:  Patient Active Problem List   Diagnosis Date Noted  . Status post total knee replacement, bilateral 12/01/2016  . Status post bilateral foot surgery 12/01/2016  . Contracture of right elbow 06/24/2016  . Contracture of left elbow 06/24/2016  . Rheumatoid arthritis involving multiple sites with positive rheumatoid factor ([place]) 05/27/2016  . ANA positive 05/27/2016  . Lateral epicondylitis, left elbow 05/27/2016  . Trochanteric bursitis of left hip 05/27/2016  . High risk medication use 05/27/2016  . DJD (degenerative joint disease), cervical 05/27/2016  . Osteopenia of neck of left femur 05/27/2016  . Primary osteoarthritis of both feet 05/27/2016  . S/P TAH-BSO (5/30) 09/04/2012    Past Medical History:  Diagnosis Date  . Arthritis    rheumotroid  . Complication of anesthesia    "irregular heartbeat" during neck surgery  . Fibroid   . Rheumatoid arteritis    age 42  . Urinary incontinence     Family History  Problem Relation Age of Onset  . Ovarian cancer Mother 74  . Lung cancer Father        asbestos  . Cancer Brother 77       appendiceal  . Seizures Brother   . Colon cancer Maternal Aunt   . Breast cancer Paternal Aunt 32  .  Colon cancer Maternal Grandmother 37  . Stroke Paternal Grandmother   . Heart disease Paternal Grandmother   . Breast cancer Paternal Aunt 77   Past Surgical History:  Procedure Laterality Date  . ABDOMINAL HYSTERECTOMY N/A 09/03/2012   Procedure: HYSTERECTOMY ABDOMINAL;  Surgeon: Marvene Staff, MD;  Location: [facility];  Service:  Gynecology;  Laterality: N/A;  . FOOT SURGERY Bilateral    joint implants, each foot x 2  . KNEE SURGERY Right 2006   fracture knee after replacement, repaired  . POSTERIOR FUSION CERVICAL SPINE  2006  . REPLACEMENT TOTAL KNEE Bilateral 1989   left 1989; right 1996  . SALPINGOOPHORECTOMY Bilateral 09/03/2012   Procedure: SALPINGO OOPHORECTOMY;  Surgeon: Marvene Staff, MD;  Location: [facility];  Service: Gynecology;  Laterality: Bilateral;  . TONSILLECTOMY  1978   Social History   Social History Narrative  . Not on file     Objective: Vital Signs: BP 119/83 (BP Location: Left Arm, Patient Position: Sitting, Cuff Size: Normal)   Pulse 61   Ht 5\' 2"  (1.575 m)   Wt 171 lb (77.6 kg) Comment: per patient, refused to weigh  LMP 04/07/2012   BMI 31.28 kg/m    Physical Exam  Constitutional: She is oriented to person, place, and time. She appears well-developed and well-nourished.  HENT:  Head: Normocephalic and atraumatic.  Eyes: Conjunctivae and EOM are normal.  Neck: Normal range of motion.  Cardiovascular: Normal rate, regular rhythm, normal heart sounds and intact distal pulses.  Pulmonary/Chest: Effort normal and breath sounds normal.  Abdominal: Soft. Bowel sounds are normal.  Lymphadenopathy:    She has no cervical adenopathy.  Neurological: She is alert and oriented to person, place, and time.  Skin: Skin is warm and dry. Capillary refill takes less than 2 seconds.  Psychiatric: She has a normal mood and affect. Her behavior is normal.  Nursing note and vitals reviewed.    Musculoskeletal Exam: C-spine limited range of motion.  Thoracic and lumbar spine fairly good range of motion.  She has contractures in her bilateral elbows with synovial thickening.  She has subluxation and thickening of MCP joints without any active synovitis.  She has good range of motion of her hip joints.  She has bilateral total knee replacements which are doing well.  She has limited range of  motion of her ankle joints.  CDAI Exam: CDAI Homunculus Exam:   Joint Counts:  CDAI Tender Joint count: 0 CDAI Swollen Joint count: 0  Global Assessments:  Patient Global Assessment: 1 Provider Global Assessment: 1  CDAI Calculated Score: 2    Investigation: No additional findings.TB Gold: 02/06/2017 Negative  CBC Latest Ref Rng & Units 05/18/2017 02/06/2017 10/18/2016  WBC 3.8 - 10.8 Thousand/uL 7.4 6.7 7.8  Hemoglobin 11.7 - 15.5 g/dL 13.8 14.5 13.0  Hematocrit 35.0 - 45.0 % 42.1 43.5 39.6  Platelets 140 - 400 Thousand/uL 294 319 354   CMP Latest Ref Rng & Units 05/18/2017 02/06/2017 10/18/2016  Glucose 65 - 99 mg/dL 80 82 87  BUN 7 - 25 mg/dL 11 9 18   Creatinine 0.50 - 1.05 mg/dL 0.74 0.77 0.86  Sodium 135 - 146 mmol/L 140 139 139  Potassium 3.5 - 5.3 mmol/L 4.4 4.7 4.4  Chloride 98 - 110 mmol/L 103 107 104  CO2 20 - 32 mmol/L 28 18(L) 24  Calcium 8.6 - 10.4 mg/dL 9.9 9.6 9.2  Total Protein 6.1 - 8.1 g/dL 7.6 7.6 7.1  Total Bilirubin 0.2 - 1.2  mg/dL 0.5 0.5 0.3  Alkaline Phos 33 - 130 U/L - - 83  AST 10 - 35 U/L 18 23 17   ALT 6 - 29 U/L 13 15 11     Imaging: No results found.  Speciality Comments: No specialty comments available.    Procedures:  No procedures performed Allergies: Sulfa antibiotics; Morphine and related; and Penicillins   Assessment / Plan:     Visit Diagnoses: Rheumatoid arthritis involving multiple sites with positive rheumatoid factor (HCC) - +RF, +CCP, +ANA, erosive.  Patient has severe disease which has been very well controlled with current medications.  She has no active synovitis.  She did have synovial thickening.  High risk medication use - Orencia sq q wk, MTX 0.7 sq q wk, folic acid 2mg  po qd.  Her labs have been stable.  She will continue to get labs on every 3 months basis.  Status post total knee replacement, bilateral: Doing well.  Contracture of left elbow: Unchanged Contracture of right elbow: Unchanged  DDD (degenerative disc  disease), cervical - S/P fusion  Osteopenia of multiple sites-she is on calcium and vitamin D.  Need for regular exercise was discussed.  Class I obesity - Weight loss diet and exercise was discussed.   Orders: No orders of the defined types were placed in this encounter.  No orders of the defined types were placed in this encounter.   Face-to-face time spent with patient was 30 minutes.  Greater than 50% of time was spent in counseling and coordination of care.  Follow-Up Instructions: Return in about 5 months (around 12/14/2017) for Rheumatoid arthritis,DDD.   Bo Merino, MD  Note - This record has been created using Editor, commissioning.  Chart creation errors have been sought, but may not always  have been located. Such creation errors do not reflect on  the standard of medical care.

## 2017-07-14 ENCOUNTER — Encounter: Payer: Self-pay | Admitting: Rheumatology

## 2017-07-14 ENCOUNTER — Ambulatory Visit (INDEPENDENT_AMBULATORY_CARE_PROVIDER_SITE_OTHER): Payer: No Typology Code available for payment source | Admitting: Rheumatology

## 2017-07-14 VITALS — BP 119/83 | HR 61 | Ht 62.0 in | Wt 171.0 lb

## 2017-07-14 DIAGNOSIS — Z96653 Presence of artificial knee joint, bilateral: Secondary | ICD-10-CM | POA: Diagnosis not present

## 2017-07-14 DIAGNOSIS — M0579 Rheumatoid arthritis with rheumatoid factor of multiple sites without organ or systems involvement: Secondary | ICD-10-CM | POA: Diagnosis not present

## 2017-07-14 DIAGNOSIS — M24521 Contracture, right elbow: Secondary | ICD-10-CM | POA: Diagnosis not present

## 2017-07-14 DIAGNOSIS — E6609 Other obesity due to excess calories: Secondary | ICD-10-CM | POA: Diagnosis not present

## 2017-07-14 DIAGNOSIS — M8589 Other specified disorders of bone density and structure, multiple sites: Secondary | ICD-10-CM

## 2017-07-14 DIAGNOSIS — M24522 Contracture, left elbow: Secondary | ICD-10-CM

## 2017-07-14 DIAGNOSIS — E66811 Obesity, class 1: Secondary | ICD-10-CM

## 2017-07-14 DIAGNOSIS — M5030 Other cervical disc degeneration, unspecified cervical region: Secondary | ICD-10-CM

## 2017-07-14 DIAGNOSIS — Z6831 Body mass index (BMI) 31.0-31.9, adult: Secondary | ICD-10-CM | POA: Diagnosis not present

## 2017-07-14 DIAGNOSIS — Z79899 Other long term (current) drug therapy: Secondary | ICD-10-CM

## 2017-07-14 NOTE — Patient Instructions (Signed)
Standing Labs We placed an order today for your standing lab work.    Please come back and get your standing labs in May and every 3 months  We have open lab Monday through Friday from 8:30-11:30 AM and 1:30-4:00 PM  at the office of Dr. Bo Merino.   You may experience shorter wait times on Monday and Friday afternoons. The office is located at [address], [place], [place], [place] [address] No appointment is necessary.   Labs are drawn by [facility].  You may receive a bill from [facility] for your lab work. If you have any questions regarding directions or hours of operation,  please call [phone].

## 2017-08-04 ENCOUNTER — Ambulatory Visit (INDEPENDENT_AMBULATORY_CARE_PROVIDER_SITE_OTHER): Payer: No Typology Code available for payment source | Admitting: Obstetrics & Gynecology

## 2017-08-04 ENCOUNTER — Encounter: Payer: Self-pay | Admitting: Obstetrics & Gynecology

## 2017-08-04 VITALS — BP 110/64 | HR 76 | Resp 16 | Ht 62.0 in | Wt 170.0 lb

## 2017-08-04 DIAGNOSIS — R102 Pelvic and perineal pain: Secondary | ICD-10-CM | POA: Diagnosis not present

## 2017-08-04 DIAGNOSIS — N952 Postmenopausal atrophic vaginitis: Secondary | ICD-10-CM

## 2017-08-04 NOTE — Progress Notes (Signed)
GYNECOLOGY  VISIT  CC:   Pelvic pain follow up  HPI: 57 y.o. G43P1011 Divorced Caucasian female here after starting Vit E vaginal suppositories for treatment of vaginal atrophic changes and pelvic pain.  Reports she used the suppositories regularly, three times weekly, for several weeks.  She felt they did help with pain but then got busy with school and just "forgot".  Within a short while, she pain returned.  She is now back using the suppositories about twice weekly and the pain has resolved.  She wasn't really convinced at first that the improvement was due to the Vit E suppositories but now she is convinced.  Will continue to use them.  Denies vaginal bleeding or discharge.  for vaginal atrophy follow up.  We did discuss pelvic imaging at last visit if this didn't help pain but as it has, do not think that is needed at this time.  GYNECOLOGIC HISTORY: Patient's last menstrual period was 04/07/2012. Contraception: hysterectomy  Menopausal hormone therapy: none  Patient Active Problem List   Diagnosis Date Noted  . Status post total knee replacement, bilateral 12/01/2016  . Status post bilateral foot surgery 12/01/2016  . Contracture of right elbow 06/24/2016  . Contracture of left elbow 06/24/2016  . Rheumatoid arthritis involving multiple sites with positive rheumatoid factor ([place]) 05/27/2016  . ANA positive 05/27/2016  . Lateral epicondylitis, left elbow 05/27/2016  . Trochanteric bursitis of left hip 05/27/2016  . High risk medication use 05/27/2016  . DJD (degenerative joint disease), cervical 05/27/2016  . Osteopenia of neck of left femur 05/27/2016  . Primary osteoarthritis of both feet 05/27/2016  . S/P TAH-BSO (5/30) 09/04/2012    Past Medical History:  Diagnosis Date  . Arthritis    rheumotroid  . Complication of anesthesia    "irregular heartbeat" during neck surgery  . Fibroid   . Rheumatoid arteritis    age 50  . Urinary incontinence     Past Surgical  History:  Procedure Laterality Date  . ABDOMINAL HYSTERECTOMY N/A 09/03/2012   Procedure: HYSTERECTOMY ABDOMINAL;  Surgeon: Marvene Staff, MD;  Location: [facility];  Service: Gynecology;  Laterality: N/A;  . FOOT SURGERY Bilateral    joint implants, each foot x 2  . KNEE SURGERY Right 2006   fracture knee after replacement, repaired  . POSTERIOR FUSION CERVICAL SPINE  2006  . REPLACEMENT TOTAL KNEE Bilateral 1989   left 1989; right 1996  . SALPINGOOPHORECTOMY Bilateral 09/03/2012   Procedure: SALPINGO OOPHORECTOMY;  Surgeon: Marvene Staff, MD;  Location: [facility];  Service: Gynecology;  Laterality: Bilateral;  . TONSILLECTOMY  1978    MEDS:   Current Outpatient Medications on File Prior to Visit  Medication Sig Dispense Refill  . acetaminophen (TYLENOL) 500 MG tablet Take 1,000 mg by mouth every 6 (six) hours as needed for pain.    Marland Kitchen b complex vitamins capsule Take 1 capsule by mouth daily.    . Calcium Carb-Cholecalciferol (CALCIUM 1000 + D PO) Take by mouth daily.    . cholecalciferol (VITAMIN D) 1000 units tablet Take 1,000 Units by mouth daily.    . citalopram (CELEXA) 20 MG tablet Take 1 tablet (20 mg total) by mouth daily. 30 tablet 5  . folic acid (FOLVITE) 1 MG tablet Take 2 tablets (2 mg total) by mouth daily. 180 tablet 3  . Methotrexate Sodium (METHOTREXATE, PF,) 50 MG/2ML injection INJECT 0.7 MILLILITERS INTO THE SKIN ONCE A WEEK 10 mL 0  . NONFORMULARY OR COMPOUNDED ITEM  Vit E vaginal suppositories 200u/ml.  One pv three times weekly. 36 each 3  . ORENCIA CLICKJECT 010 MG/ML SOAJ INJECT ONE CLICKJECT PEN SUBCUTANEOUSLY EVERY WEEK. REFRIGERATE. ALLOWTO WARM TO ROOM TEMPERATURE PRIOR TO ADMINISTRATION. 12 Syringe 0  . Probiotic Product (PROBIOTIC-10) CAPS Take by mouth daily.    Marland Kitchen tolterodine (DETROL LA) 4 MG 24 hr capsule Take 4 mg by mouth daily.  1  . TUBERCULIN SYR 1CC/27GX1/2" 27G X 1/2" 1 ML MISC Once a week with methotrexate 12 each 4   No current  facility-administered medications on file prior to visit.     ALLERGIES: Sulfa antibiotics; Morphine and related; and Penicillins  Family History  Problem Relation Age of Onset  . Ovarian cancer Mother 64  . Lung cancer Father        asbestos  . Cancer Brother 60       appendiceal  . Seizures Brother   . Colon cancer Maternal Aunt   . Breast cancer Paternal Aunt 104  . Colon cancer Maternal Grandmother 42  . Stroke Paternal Grandmother   . Heart disease Paternal Grandmother   . Breast cancer Paternal Aunt 24    SH:  Divorced, non smoker  Review of Systems  All other systems reviewed and are negative.   PHYSICAL EXAMINATION:    BP 110/64 (BP Location: Right Arm, Patient Position: Sitting, Cuff Size: Large)   Pulse 76   Resp 16   Ht 5\' 2"  (1.575 m)   Wt 170 lb (77.1 kg)   LMP 04/07/2012   BMI 31.09 kg/m     General appearance: alert, cooperative and appears stated age  Pelvic: External genitalia:  no lesions              Urethra:  normal appearing urethra with no masses, tenderness or lesions              Bartholins and Skenes: normal                 Vagina: normal appearing vagina with normal color and discharge, no lesions, tissue is improved in color              Cervix: absent              Bimanual Exam:  Uterus:  uterus absent              Adnexa: no mass, fullness, tenderness  Chaperone was present for exam.  Assessment: Pelvic pain Vaginal atrophic changes  Plan: Continue Vit E vaginal suppositories 200u/ml two to three times weekly.  Will call when needs RF.  Pt knows to call for any new issues/concerns.     ~15 minutes spent with patient >50% of time was in face to face discussion of above.

## 2017-08-07 ENCOUNTER — Other Ambulatory Visit: Payer: Self-pay | Admitting: Rheumatology

## 2017-08-07 NOTE — Telephone Encounter (Signed)
Last Visit: 07/14/17 Next Visit: 12/15/17 Labs: 05/18/17 WNL  Okay to refill per Dr. Estanislado Pandy

## 2017-08-13 ENCOUNTER — Other Ambulatory Visit: Payer: Self-pay

## 2017-08-13 DIAGNOSIS — Z79899 Other long term (current) drug therapy: Secondary | ICD-10-CM

## 2017-08-13 LAB — COMPLETE METABOLIC PANEL WITH GFR
AG Ratio: 1.3 (calc) (ref 1.0–2.5)
ALT: 16 U/L (ref 6–29)
AST: 20 U/L (ref 10–35)
Albumin: 4.1 g/dL (ref 3.6–5.1)
Alkaline phosphatase (APISO): 88 U/L (ref 33–130)
BUN: 12 mg/dL (ref 7–25)
CO2: 27 mmol/L (ref 20–32)
Calcium: 9.8 mg/dL (ref 8.6–10.4)
Chloride: 103 mmol/L (ref 98–110)
Creat: 0.71 mg/dL (ref 0.50–1.05)
GFR, Est African American: 112 mL/min/{1.73_m2} (ref >=60)
GFR, Est Non African American: 97 mL/min/{1.73_m2} (ref >=60)
Globulin: 3.1 g/dL (calc) (ref 1.9–3.7)
Glucose, Bld: 135 mg/dL — ABNORMAL HIGH (ref 65–99)
Potassium: 4.4 mmol/L (ref 3.5–5.3)
Sodium: 138 mmol/L (ref 135–146)
Total Bilirubin: 0.5 mg/dL (ref 0.2–1.2)
Total Protein: 7.2 g/dL (ref 6.1–8.1)

## 2017-08-13 LAB — CBC WITH DIFFERENTIAL/PLATELET
Basophils Absolute: 90 cells/uL (ref 0–200)
Basophils Relative: 1.2 %
Eosinophils Absolute: 180 cells/uL (ref 15–500)
Eosinophils Relative: 2.4 %
HCT: 41.1 % (ref 35.0–45.0)
Hemoglobin: 13.8 g/dL (ref 11.7–15.5)
Lymphs Abs: 3360 cells/uL (ref 850–3900)
MCH: 27.5 pg (ref 27.0–33.0)
MCHC: 33.6 g/dL (ref 32.0–36.0)
MCV: 81.9 fL (ref 80.0–100.0)
MPV: 11 fL (ref 7.5–12.5)
Monocytes Relative: 3.2 %
Neutro Abs: 3630 cells/uL (ref 1500–7800)
Neutrophils Relative %: 48.4 %
Platelets: 308 10*3/uL (ref 140–400)
RBC: 5.02 10*6/uL (ref 3.80–5.10)
RDW: 13.2 % (ref 11.0–15.0)
Total Lymphocyte: 44.8 %
WBC mixed population: 240 cells/uL (ref 200–950)
WBC: 7.5 10*3/uL (ref 3.8–10.8)

## 2017-08-17 ENCOUNTER — Other Ambulatory Visit: Payer: Self-pay | Admitting: Rheumatology

## 2017-08-17 NOTE — Telephone Encounter (Signed)
Last Visit: 07/14/17 Next Visit: 12/15/17 Labs: 08/13/17 Glucose is elevated. All other lab values are WNL TB Gold: 02/06/17 Neg   Okay to refill per Dr. Estanislado Pandy

## 2017-09-18 ENCOUNTER — Encounter: Payer: No Typology Code available for payment source | Admitting: Obstetrics & Gynecology

## 2017-10-13 ENCOUNTER — Other Ambulatory Visit: Payer: Self-pay | Admitting: Rheumatology

## 2017-10-13 NOTE — Telephone Encounter (Signed)
Patient called stating that her pharmacy has been trying to get in contact with [place] from [facility] to refill her Orencia.  [facility].  CB#[phone] (Pharmacy).  Patient's [phone]

## 2017-10-13 NOTE — Telephone Encounter (Signed)
Attempted to contact patient and unable to leave a message. Mailbox is full.  

## 2017-10-14 NOTE — Telephone Encounter (Signed)
A 90 day prescription was sent to the prescription on 08/17/17. Patient advised and she contacted the pharmacy. Patient states the prescription will be shipped to her.

## 2017-11-23 ENCOUNTER — Other Ambulatory Visit: Payer: Self-pay

## 2017-11-23 DIAGNOSIS — Z79899 Other long term (current) drug therapy: Secondary | ICD-10-CM

## 2017-11-23 LAB — CBC WITH DIFFERENTIAL/PLATELET
Basophils Absolute: 89 cells/uL (ref 0–200)
Basophils Relative: 1 %
Eosinophils Absolute: 187 cells/uL (ref 15–500)
Eosinophils Relative: 2.1 %
HCT: 41.8 % (ref 35.0–45.0)
Hemoglobin: 13.6 g/dL (ref 11.7–15.5)
Lymphs Abs: 2545 cells/uL (ref 850–3900)
MCH: 27.4 pg (ref 27.0–33.0)
MCHC: 32.5 g/dL (ref 32.0–36.0)
MCV: 84.3 fL (ref 80.0–100.0)
MPV: 10.9 fL (ref 7.5–12.5)
Monocytes Relative: 4.8 %
Neutro Abs: 5652 cells/uL (ref 1500–7800)
Neutrophils Relative %: 63.5 %
Platelets: 298 10*3/uL (ref 140–400)
RBC: 4.96 10*6/uL (ref 3.80–5.10)
RDW: 13.8 % (ref 11.0–15.0)
Total Lymphocyte: 28.6 %
WBC mixed population: 427 cells/uL (ref 200–950)
WBC: 8.9 10*3/uL (ref 3.8–10.8)

## 2017-11-23 LAB — COMPLETE METABOLIC PANEL WITH GFR
AG Ratio: 1.3 (calc) (ref 1.0–2.5)
ALT: 19 U/L (ref 6–29)
AST: 22 U/L (ref 10–35)
Albumin: 4.1 g/dL (ref 3.6–5.1)
Alkaline phosphatase (APISO): 93 U/L (ref 33–130)
BUN: 13 mg/dL (ref 7–25)
CO2: 28 mmol/L (ref 20–32)
Calcium: 9.9 mg/dL (ref 8.6–10.4)
Chloride: 103 mmol/L (ref 98–110)
Creat: 0.75 mg/dL (ref 0.50–1.05)
GFR, Est African American: 105 mL/min/{1.73_m2} (ref >=60)
GFR, Est Non African American: 90 mL/min/{1.73_m2} (ref >=60)
Globulin: 3.1 g/dL (calc) (ref 1.9–3.7)
Glucose, Bld: 69 mg/dL (ref 65–99)
Potassium: 4.6 mmol/L (ref 3.5–5.3)
Sodium: 139 mmol/L (ref 135–146)
Total Bilirubin: 0.4 mg/dL (ref 0.2–1.2)
Total Protein: 7.2 g/dL (ref 6.1–8.1)

## 2017-11-25 ENCOUNTER — Other Ambulatory Visit: Payer: Self-pay | Admitting: *Deleted

## 2017-11-25 MED ORDER — ABATACEPT 125 MG/ML ~~LOC~~ SOAJ: 125.0000 mg | SUBCUTANEOUS | 0 refills | Status: DC

## 2017-11-25 NOTE — Telephone Encounter (Signed)
Refill request received via fax  Last Visit: 07/14/17 Next Visit: 12/15/17 Labs: 11/23/17 WNL TB Gold: 02/06/17 Neg   Okay to refill per Dr. Estanislado Pandy

## 2017-12-09 NOTE — Progress Notes (Signed)
Office Visit Note  Patient: [name] [name]             Date of Birth: 13-Nov-1960           MRN: [id number]             PCP: Kelton Pillar, MD Referring: Kelton Pillar, MD Visit Date: 12/23/2017 Occupation: @GUAROCC @  Subjective:  Medication monitoring   History of Present Illness: [name] [name] is a 57 y.o. female with history of seropositive rheumatoid arthritis and DDD.  Patient is on Orencia subcutaneous injections every week, methotrexate 0.7 ml subcutaneous injections once weekly and folic acid 2 mg daily.  She denies any recent flares.  She states that she was having some discomfort in bilateral shoulders worse in her right shoulder several weeks ago.  She states that she feels like she slept wrong on her shoulders which aggravated them.  She states the pain has resolved since then.  She denies any joint swelling or joint pain at this time.  She says she occasionally has right ankle pain but does not have any at this time.   Activities of Daily Living:  Patient reports morning stiffness for0 minute.   Patient Denies nocturnal pain.  Difficulty dressing/grooming: Denies Difficulty climbing stairs: Reports Difficulty getting out of chair: Denies Difficulty using hands for taps, buttons, cutlery, and/or writing: Denies  Review of Systems  Constitutional: Negative for fatigue and fever.  HENT: Negative for mouth sores, mouth dryness and nose dryness.   Eyes: Negative for pain, visual disturbance and dryness.  Respiratory: Negative for cough, hemoptysis, shortness of breath and difficulty breathing.   Cardiovascular: Negative for chest pain, palpitations, hypertension and swelling in legs/feet.  Gastrointestinal: Negative for blood in stool, constipation and diarrhea.  Endocrine: Negative for increased urination.  Genitourinary: Negative for difficulty urinating and painful urination.  Musculoskeletal: Positive for arthralgias, joint pain, joint swelling, myalgias, muscle  weakness and myalgias. Negative for morning stiffness and muscle tenderness.  Skin: Negative for color change, pallor, rash, hair loss, nodules/bumps, skin tightness, ulcers and sensitivity to sunlight.  Allergic/Immunologic: Negative for susceptible to infections.  Neurological: Negative for dizziness, numbness, headaches and weakness.  Hematological: Negative for swollen glands.  Psychiatric/Behavioral: Positive for sleep disturbance. Negative for depressed mood. The patient is not nervous/anxious.     PMFS History:  Patient Active Problem List   Diagnosis Date Noted  . Status post total knee replacement, bilateral 12/01/2016  . Status post bilateral foot surgery 12/01/2016  . Contracture of right elbow 06/24/2016  . Contracture of left elbow 06/24/2016  . Rheumatoid arthritis involving multiple sites with positive rheumatoid factor ([place]) 05/27/2016  . ANA positive 05/27/2016  . Lateral epicondylitis, left elbow 05/27/2016  . Trochanteric bursitis of left hip 05/27/2016  . High risk medication use 05/27/2016  . DJD (degenerative joint disease), cervical 05/27/2016  . Osteopenia of neck of left femur 05/27/2016  . Primary osteoarthritis of both feet 05/27/2016  . S/P TAH-BSO (5/30) 09/04/2012    Past Medical History:  Diagnosis Date  . Arthritis    rheumotroid  . Complication of anesthesia    "irregular heartbeat" during neck surgery  . Fibroid   . Rheumatoid arteritis    age 41  . Urinary incontinence     Family History  Problem Relation Age of Onset  . Ovarian cancer Mother 18  . Lung cancer Father        asbestos  . Cancer Brother 16       appendiceal  .  Seizures Brother   . Colon cancer Maternal Aunt   . Breast cancer Paternal Aunt 37  . Colon cancer Maternal Grandmother 25  . Stroke Paternal Grandmother   . Heart disease Paternal Grandmother   . Breast cancer Paternal Aunt 71   Past Surgical History:  Procedure Laterality Date  . ABDOMINAL HYSTERECTOMY N/A  09/03/2012   Procedure: HYSTERECTOMY ABDOMINAL;  Surgeon: Marvene Staff, MD;  Location: [facility];  Service: Gynecology;  Laterality: N/A;  . FOOT SURGERY Bilateral    joint implants, each foot x 2  . KNEE SURGERY Right 2006   fracture knee after replacement, repaired  . POSTERIOR FUSION CERVICAL SPINE  2006  . REPLACEMENT TOTAL KNEE Bilateral 1989   left 1989; right 1996  . SALPINGOOPHORECTOMY Bilateral 09/03/2012   Procedure: SALPINGO OOPHORECTOMY;  Surgeon: Marvene Staff, MD;  Location: [facility];  Service: Gynecology;  Laterality: Bilateral;  . TONSILLECTOMY  1978   Social History   Social History Narrative  . Not on file    Objective: Vital Signs: BP 112/76 (BP Location: Left Arm, Patient Position: Sitting, Cuff Size: Normal)   Pulse 69   Ht 5\' 2"  (1.575 m)   Wt 168 lb (76.2 kg)   LMP 04/07/2012   BMI 30.73 kg/m    Physical Exam  Constitutional: She is oriented to person, place, and time. She appears well-developed and well-nourished.  HENT:  Head: Normocephalic and atraumatic.  Eyes: Conjunctivae and EOM are normal.  Neck: Normal range of motion.  Cardiovascular: Normal rate, regular rhythm, normal heart sounds and intact distal pulses.  Pulmonary/Chest: Effort normal and breath sounds normal.  Abdominal: Soft. Bowel sounds are normal.  Lymphadenopathy:    She has no cervical adenopathy.  Neurological: She is alert and oriented to person, place, and time.  Skin: Skin is warm and dry. Capillary refill takes less than 2 seconds.  Psychiatric: She has a normal mood and affect. Her behavior is normal.  Nursing note and vitals reviewed.    Musculoskeletal Exam: C-spine very limited range of motion.  Thoracic lumbar spine good range of motion.  No midline spinal tenderness.  No SI joint tenderness.  She has bilateral elbow joint contractures with synovial thickening.  She has subluxation and I am synovial thickening of first MCP joint.  She has synovial thickening  of all MCP but no synovitis noted.  Ulnar deviation.  She has DIP synovial thickening consistent with osteoarthritis of bilateral hands.  She has good range of motion of bilateral hip joints with no discomfort.  Bilateral knee replacements are doing well.  No warmth or effusion was noted.  She has limited range of motion of bilateral ankle joints.  CDAI Exam: CDAI Score: 0  Patient Global Assessment: 0 (mm); Provider Global Assessment: 0 (mm) Swollen: 0 ; Tender: 0  Joint Exam   Not documented   There is currently no information documented on the homunculus. Go to the Rheumatology activity and complete the homunculus joint exam.  Investigation: No additional findings.  Imaging: No results found.  Recent Labs: Lab Results  Component Value Date   WBC 8.9 11/23/2017   HGB 13.6 11/23/2017   PLT 298 11/23/2017   NA 139 11/23/2017   K 4.6 11/23/2017   CL 103 11/23/2017   CO2 28 11/23/2017   GLUCOSE 69 11/23/2017   BUN 13 11/23/2017   CREATININE 0.75 11/23/2017   BILITOT 0.4 11/23/2017   ALKPHOS 83 10/18/2016   AST 22 11/23/2017   ALT 19  11/23/2017   PROT 7.2 11/23/2017   ALBUMIN 4.0 10/18/2016   CALCIUM 9.9 11/23/2017   GFRAA 105 11/23/2017   QFTBGOLD NEGATIVE 02/06/2017    Speciality Comments: No specialty comments available.  Procedures:  No procedures performed Allergies: Sulfa antibiotics; Morphine and related; and Penicillins   Assessment / Plan:     Visit Diagnoses: Rheumatoid arthritis involving multiple sites with positive rheumatoid factor ([facility]) - RF+, CCP+, ANA+, erosive disease: She has no active synovitis on exam.  She has not had any recent rheumatoid arthritis flares.  She has synovial thickening of all MCP joints as well as bilateral elbow joints.  She has bilateral elbow joint contractures.  She has synovial thickening and limited range of motion of bilateral ankle joints.  She is not having any joint pain at this time.  Her disease has been well controlled  on Orencia subcutaneous injections once weekly, methotrexate 0.7 mL subcutaneously once weekly and folic acid 2 mg daily.  She does not need any refills at this time.  She was advised to notify us if she develops increased joint pain or joint swelling.  She will follow-up in the office in 5 months.  High risk medication use - Orencia sq q wk, MTX 0.7 sq q wk, folic acid 2mg  po qd. CBC and CMP were within normal limits on 11/23/2017.  She will return for lab work in November to monitor for drug toxicity.  A future order for TB gold was placed today.- Plan: QuantiFERON-TB Gold Plus  Status post total knee replacement, bilateral: Doing well.  She is good range of motion with no discomfort.  No warmth or effusion noted.  Contracture of left elbow: Chronic.  She has synovial thickening of the left elbow joint but no synovitis was noted.  Contracture of right elbow: Chronic.  Synovial thickening noted but no synovitis.  DDD (degenerative disc disease), cervical: She has very limited range of motion of her C-spine.  She has no symptoms of radiculopathy at this time.  She is advised to notify us if she develops any new or worsening symptoms.  Osteopenia of multiple sites: She takes a calcium and vitamin D supplement daily.  Trochanteric bursitis of right hip: Resolved.  She has no tenderness on exam.  Orders: Orders Placed This Encounter  Procedures  . QuantiFERON-TB Gold Plus   No orders of the defined types were placed in this encounter.     Follow-Up Instructions: Return in about 5 months (around 05/25/2018) for Rheumatoid arthritis, DD.   Ofilia Neas, PA-C  Note - This record has been created using Dragon software.  Chart creation errors have been sought, but may not always  have been located. Such creation errors do not reflect on  the standard of medical care.

## 2017-12-15 ENCOUNTER — Ambulatory Visit: Payer: No Typology Code available for payment source | Admitting: Rheumatology

## 2017-12-23 ENCOUNTER — Encounter: Payer: Self-pay | Admitting: Physician Assistant

## 2017-12-23 ENCOUNTER — Ambulatory Visit: Payer: No Typology Code available for payment source | Admitting: Physician Assistant

## 2017-12-23 VITALS — BP 112/76 | HR 69 | Ht 62.0 in | Wt 168.0 lb

## 2017-12-23 DIAGNOSIS — M24522 Contracture, left elbow: Secondary | ICD-10-CM | POA: Diagnosis not present

## 2017-12-23 DIAGNOSIS — Z96653 Presence of artificial knee joint, bilateral: Secondary | ICD-10-CM | POA: Diagnosis not present

## 2017-12-23 DIAGNOSIS — Z79899 Other long term (current) drug therapy: Secondary | ICD-10-CM | POA: Diagnosis not present

## 2017-12-23 DIAGNOSIS — M0579 Rheumatoid arthritis with rheumatoid factor of multiple sites without organ or systems involvement: Secondary | ICD-10-CM | POA: Diagnosis not present

## 2017-12-23 DIAGNOSIS — M5030 Other cervical disc degeneration, unspecified cervical region: Secondary | ICD-10-CM

## 2017-12-23 DIAGNOSIS — M8589 Other specified disorders of bone density and structure, multiple sites: Secondary | ICD-10-CM

## 2017-12-23 DIAGNOSIS — M24521 Contracture, right elbow: Secondary | ICD-10-CM

## 2017-12-23 DIAGNOSIS — M7061 Trochanteric bursitis, right hip: Secondary | ICD-10-CM

## 2017-12-23 NOTE — Patient Instructions (Addendum)
Magnesium malate 250 mg by mouth at bedtime    Standing Labs We placed an order today for your standing lab work.    Please come back and get your standing labs in November and every 3 months   CBC, CMP, and TB gold   We have open lab Monday through Friday from 8:30-11:30 AM and 1:30-4:00 PM  at the office of Dr. Bo Merino.   You may experience shorter wait times on Monday and Friday afternoons. The office is located at [address] [place], [place], [place] [address] No appointment is necessary.   Labs are drawn by [facility].  You may receive a bill from [facility] for your lab work. If you have any questions regarding directions or hours of operation,  please call [phone].

## 2018-01-06 ENCOUNTER — Other Ambulatory Visit: Payer: Self-pay | Admitting: Rheumatology

## 2018-01-06 NOTE — Telephone Encounter (Signed)
Last Visit: 12/23/17 Next Visit: 05/25/18  Okay to refill per Dr. Estanislado Pandy

## 2018-02-18 ENCOUNTER — Other Ambulatory Visit: Payer: Self-pay | Admitting: *Deleted

## 2018-02-18 ENCOUNTER — Other Ambulatory Visit: Payer: Self-pay | Admitting: Rheumatology

## 2018-02-18 DIAGNOSIS — Z79899 Other long term (current) drug therapy: Secondary | ICD-10-CM

## 2018-02-18 NOTE — Telephone Encounter (Signed)
Last Visit: 12/23/17 Next Visit: 05/25/18 Labs: 11/23/17 wnl  TB gold: 02/06/17 neg   Okay to refill per Dr. Estanislado Pandy

## 2018-03-01 ENCOUNTER — Other Ambulatory Visit: Payer: Self-pay | Admitting: Obstetrics & Gynecology

## 2018-03-01 DIAGNOSIS — Z1231 Encounter for screening mammogram for malignant neoplasm of breast: Secondary | ICD-10-CM

## 2018-03-09 ENCOUNTER — Other Ambulatory Visit: Payer: Self-pay | Admitting: Rheumatology

## 2018-03-09 DIAGNOSIS — Z79899 Other long term (current) drug therapy: Secondary | ICD-10-CM

## 2018-03-09 NOTE — Telephone Encounter (Addendum)
Last Visit: 12/23/17 Next Visit: 05/25/18 Labs: 11/23/17 wnl  TB gold: 02/06/17 neg   Patient advised she is due to update her labs and will update 03/10/18  Okay to refill per Dr. Estanislado Pandy

## 2018-03-12 LAB — CBC WITH DIFFERENTIAL/PLATELET
Basophils Absolute: 69 cells/uL (ref 0–200)
Basophils Relative: 0.8 %
Eosinophils Absolute: 138 cells/uL (ref 15–500)
Eosinophils Relative: 1.6 %
HCT: 43.1 % (ref 35.0–45.0)
Hemoglobin: 14.3 g/dL (ref 11.7–15.5)
Lymphs Abs: 2743 cells/uL (ref 850–3900)
MCH: 28.3 pg (ref 27.0–33.0)
MCHC: 33.2 g/dL (ref 32.0–36.0)
MCV: 85.3 fL (ref 80.0–100.0)
MPV: 11.2 fL (ref 7.5–12.5)
Monocytes Relative: 4.6 %
Neutro Abs: 5255 cells/uL (ref 1500–7800)
Neutrophils Relative %: 61.1 %
Platelets: 299 10*3/uL (ref 140–400)
RBC: 5.05 10*6/uL (ref 3.80–5.10)
RDW: 13.6 % (ref 11.0–15.0)
Total Lymphocyte: 31.9 %
WBC mixed population: 396 cells/uL (ref 200–950)
WBC: 8.6 10*3/uL (ref 3.8–10.8)

## 2018-03-12 LAB — COMPLETE METABOLIC PANEL WITH GFR
AG Ratio: 1.3 (calc) (ref 1.0–2.5)
ALT: 12 U/L (ref 6–29)
AST: 16 U/L (ref 10–35)
Albumin: 3.9 g/dL (ref 3.6–5.1)
Alkaline phosphatase (APISO): 103 U/L (ref 33–130)
BUN: 13 mg/dL (ref 7–25)
CO2: 29 mmol/L (ref 20–32)
Calcium: 9.3 mg/dL (ref 8.6–10.4)
Chloride: 103 mmol/L (ref 98–110)
Creat: 0.79 mg/dL (ref 0.50–1.05)
GFR, Est African American: 98 mL/min/{1.73_m2} (ref >=60)
GFR, Est Non African American: 85 mL/min/{1.73_m2} (ref >=60)
Globulin: 3.1 g/dL (calc) (ref 1.9–3.7)
Glucose, Bld: 79 mg/dL (ref 65–99)
Potassium: 4.2 mmol/L (ref 3.5–5.3)
Sodium: 140 mmol/L (ref 135–146)
Total Bilirubin: 0.6 mg/dL (ref 0.2–1.2)
Total Protein: 7 g/dL (ref 6.1–8.1)

## 2018-03-12 LAB — QUANTIFERON-TB GOLD PLUS
Mitogen-NIL: 7.81 IU/mL
NIL: 0.02 IU/mL
QuantiFERON-TB Gold Plus: NEGATIVE
TB1-NIL: 0.01 IU/mL
TB2-NIL: 0 IU/mL

## 2018-03-15 NOTE — Telephone Encounter (Signed)
TB gold negative

## 2018-05-05 ENCOUNTER — Ambulatory Visit: Payer: No Typology Code available for payment source | Admitting: Orthopedic Surgery

## 2018-05-10 ENCOUNTER — Ambulatory Visit: Payer: No Typology Code available for payment source

## 2018-05-12 ENCOUNTER — Telehealth: Payer: Self-pay | Admitting: Rheumatology

## 2018-05-12 NOTE — Telephone Encounter (Signed)
Patient advised she is due for her labs in March 2020.

## 2018-05-12 NOTE — Progress Notes (Signed)
Office Visit Note  Patient: [name] [name]             Date of Birth: [birth year]           MRN: [id number]             PCP: Kelton Pillar, MD Referring: Kelton Pillar, MD Visit Date: 05/26/2018 Occupation: @GUAROCC @  Subjective:  Pain in both ankle joints    History of Present Illness: [name] [name] is a 58 y.o. female with history of seropositive rheumatoid arthritis and DDD.  She is on Orencia 125 mg sq injections once weekly, MTX 0.7 ml sq once weekly, and folic acid 2 mg po daily. She denies any recent flares.  She does not feel like Orencia is as effective as Enbrel.  She reports she has been having more aching due to recent weather changes.   She reports she is having pain and swelling in both ankle joints. She denies any other joint pain or joint swelling at this time.      Activities of Daily Living:  Patient reports morning stiffness for 5 minutes.   Patient Denies nocturnal pain.  Difficulty dressing/grooming: Denies Difficulty climbing stairs: Reports Difficulty getting out of chair: Denies Difficulty using hands for taps, buttons, cutlery, and/or writing: Denies  Review of Systems  Constitutional: Positive for fatigue.  HENT: Negative for mouth dryness.   Eyes: Negative for dryness.  Respiratory: Negative for shortness of breath.   Cardiovascular: Positive for swelling in legs/feet.  Gastrointestinal: Negative for constipation.  Endocrine: Negative for excessive thirst.  Genitourinary: Negative for difficulty urinating.  Musculoskeletal: Positive for arthralgias, joint pain, joint swelling and morning stiffness.  Skin: Negative for rash.  Allergic/Immunologic: Positive for susceptible to infections.  Neurological: Negative for numbness.  Hematological: Negative for bruising/bleeding tendency.  Psychiatric/Behavioral: Positive for sleep disturbance.    PMFS History:  Patient Active Problem List   Diagnosis Date Noted  . Status post total knee  replacement, bilateral 12/01/2016  . Status post bilateral foot surgery 12/01/2016  . Contracture of right elbow 06/24/2016  . Contracture of left elbow 06/24/2016  . Rheumatoid arthritis involving multiple sites with positive rheumatoid factor ([place]) 05/27/2016  . ANA positive 05/27/2016  . Lateral epicondylitis, left elbow 05/27/2016  . Trochanteric bursitis of left hip 05/27/2016  . High risk medication use 05/27/2016  . DJD (degenerative joint disease), cervical 05/27/2016  . Osteopenia of neck of left femur 05/27/2016  . Primary osteoarthritis of both feet 05/27/2016  . S/P TAH-BSO (5/30) 09/04/2012    Past Medical History:  Diagnosis Date  . Arthritis    rheumotroid  . Complication of anesthesia    "irregular heartbeat" during neck surgery  . Fibroid   . Rheumatoid arteritis ([place])    age 59  . Urinary incontinence     Family History  Problem Relation Age of Onset  . Ovarian cancer Mother 59  . Lung cancer Father        asbestos  . Cancer Brother 39       appendiceal  . Seizures Brother   . Colon cancer Maternal Aunt   . Breast cancer Paternal Aunt 22  . Colon cancer Maternal Grandmother 19  . Stroke Paternal Grandmother   . Heart disease Paternal Grandmother   . Breast cancer Paternal Aunt 63   Past Surgical History:  Procedure Laterality Date  . ABDOMINAL HYSTERECTOMY N/A 09/03/2012   Procedure: HYSTERECTOMY ABDOMINAL;  Surgeon: Marvene Staff, MD;  Location: [facility];  Service: Gynecology;  Laterality: N/A;  . FOOT SURGERY Bilateral    joint implants, each foot x 2  . JOINT REPLACEMENT    . KNEE SURGERY Right 2006   fracture knee after replacement, repaired  . POSTERIOR FUSION CERVICAL SPINE  2006  . REPLACEMENT TOTAL KNEE Bilateral 1989   left 1989; right 1996  . SALPINGOOPHORECTOMY Bilateral 09/03/2012   Procedure: SALPINGO OOPHORECTOMY;  Surgeon: Marvene Staff, MD;  Location: [facility];  Service: Gynecology;  Laterality: Bilateral;  .  TONSILLECTOMY  1978   Social History   Social History Narrative  . Not on file    There is no immunization history on file for this patient.   Objective: Vital Signs: BP 128/82 (BP Location: Left Arm, Patient Position: Sitting, Cuff Size: Normal)   Pulse 63   Resp 16   Ht 5\' 2"  (1.575 m)   LMP 04/07/2012   BMI 30.73 kg/m    Physical Exam Vitals signs and nursing note reviewed.  Constitutional:      Appearance: She is well-developed.  HENT:     Head: Normocephalic and atraumatic.  Eyes:     Conjunctiva/sclera: Conjunctivae normal.  Neck:     Musculoskeletal: Normal range of motion.  Cardiovascular:     Rate and Rhythm: Normal rate and regular rhythm.     Heart sounds: Normal heart sounds.  Pulmonary:     Effort: Pulmonary effort is normal.     Breath sounds: Normal breath sounds.  Abdominal:     General: Bowel sounds are normal.     Palpations: Abdomen is soft.  Lymphadenopathy:     Cervical: No cervical adenopathy.  Skin:    General: Skin is warm and dry.     Capillary Refill: Capillary refill takes less than 2 seconds.  Neurological:     Mental Status: She is alert and oriented to person, place, and time.  Psychiatric:        Behavior: Behavior normal.      Musculoskeletal Exam: C-spine very limited ROM.  Thoracic and lumbar spine good ROM.  No midline spinal tenderness.  No SI joint tenderness.  Shoulder joints slightly limited ROM with discomfort. Bilateral elbow joint contractures.  Synovial thickening and very limited ROM of both wrist joints.  Synovial thickening of MCPs but no synovitis noted.  Hip joints good ROM.  Bilateral knee joint replacements have good ROM.  Left knee replacement is warm.  She has tenderness of both ankle joints.   CDAI Exam: CDAI Score: 0.8  Patient Global Assessment: 4 (mm); Provider Global Assessment: 4 (mm) Swollen: 0 ; Tender: 0  Joint Exam   Not documented   There is currently no information documented on the homunculus.  Go to the Rheumatology activity and complete the homunculus joint exam.  Investigation: No additional findings.  Imaging: No results found.  Recent Labs: Lab Results  Component Value Date   WBC 8.6 03/10/2018   HGB 14.3 03/10/2018   PLT 299 03/10/2018   NA 140 03/10/2018   K 4.2 03/10/2018   CL 103 03/10/2018   CO2 29 03/10/2018   GLUCOSE 79 03/10/2018   BUN 13 03/10/2018   CREATININE 0.79 03/10/2018   BILITOT 0.6 03/10/2018   ALKPHOS 83 10/18/2016   AST 16 03/10/2018   ALT 12 03/10/2018   PROT 7.0 03/10/2018   ALBUMIN 4.0 10/18/2016   CALCIUM 9.3 03/10/2018   GFRAA 98 03/10/2018   QFTBGOLD NEGATIVE 02/06/2017   QFTBGOLDPLUS NEGATIVE 03/10/2018    Speciality  Comments: No specialty comments available.  Procedures:  No procedures performed Allergies: Sulfa antibiotics; Morphine and related; and Penicillins  Assessment / Plan:     Visit Diagnoses: Rheumatoid arthritis involving multiple sites with positive rheumatoid factor (HCC) -  RF+, CCP+, ANA+, erosive disease: She has no synovitis on exam. She has severe rheumatoid arthritis but her disease to seems to be well controlled on combination therapy of Orencia 125 mg subcutaneous injections weekly, methotrexate 0.7 mL weekly, and folic acid 2 mg by mouth daily.  She has contractures of bilateral elbow joints.  She is synovial thickening of bilateral wrist joints.  She has warmth of bilateral knee replacements on exam.  She is having increased pain in bilateral ankle joints but no synovitis was noted.  The patient feels as though Enbrel was more effective in the past but Orencia methotrexate seem to be working well.  She was advised to increase her dose of methotrexate to 0.8 mL subcutaneous injections once weekly.  She will continue on Orencia 125 mg weekly injections.  Future orders for sed rate and CCP were placed today.  She will be returning for lab work in March 2020. X-rays of both hands and feet were obtained on 08/26/2016.   We will obtain x-rays at her next visit.  She was advised to notify us if develops increased joint pain or joint swelling.  She will follow-up in the office in 3 months.  High risk medication use - Orencia 125 mg weekly, methotrexate 0.7 mL weekly, and folic acid 2 mg daily.  Last TB gold negative on 03/10/2018 and will monitor yearly.  Most recent CBC/CMP within normal limits on 03/10/2018 and will monitor every 3 months.  Standing orders are in place.   Status post total knee replacement, bilateral: She has good range of motion bilateral knee replacements.  She has warmth of the left knee on exam today.  Contracture of left elbow: Chronic.  She has no tenderness or synovitis.   Contracture of right elbow: Chronic.  She has no tenderness or synovitis.   DDD (degenerative disc disease), cervical - s/p fusion:  She has very limited ROM on exam.  She has no symptoms of radiculopathy.   Osteopenia of multiple sites: She is taking calcium and vitamin D supplements daily.  Trochanteric bursitis of right hip: She has no tenderness over the trochanter bursa.  Orders: No orders of the defined types were placed in this encounter.  No orders of the defined types were placed in this encounter.     Follow-Up Instructions: Return in about 5 months (around 10/24/2018) for Rheumatoid arthritis, DDD.   Ofilia Neas, PA-C  Note - This record has been created using Dragon software.  Chart creation errors have been sought, but may not always  have been located. Such creation errors do not reflect on  the standard of medical care.

## 2018-05-12 NOTE — Telephone Encounter (Signed)
Patient left a voicemail requesting a return call to let her know when she is due for her labwork.

## 2018-05-25 ENCOUNTER — Ambulatory Visit: Payer: No Typology Code available for payment source | Admitting: Physician Assistant

## 2018-05-26 ENCOUNTER — Encounter: Payer: Self-pay | Admitting: Physician Assistant

## 2018-05-26 ENCOUNTER — Encounter (INDEPENDENT_AMBULATORY_CARE_PROVIDER_SITE_OTHER): Payer: Self-pay

## 2018-05-26 ENCOUNTER — Ambulatory Visit: Payer: No Typology Code available for payment source | Admitting: Physician Assistant

## 2018-05-26 VITALS — BP 128/82 | HR 63 | Resp 16 | Ht 62.0 in

## 2018-05-26 DIAGNOSIS — M0579 Rheumatoid arthritis with rheumatoid factor of multiple sites without organ or systems involvement: Secondary | ICD-10-CM | POA: Diagnosis not present

## 2018-05-26 DIAGNOSIS — Z96653 Presence of artificial knee joint, bilateral: Secondary | ICD-10-CM | POA: Diagnosis not present

## 2018-05-26 DIAGNOSIS — M7061 Trochanteric bursitis, right hip: Secondary | ICD-10-CM

## 2018-05-26 DIAGNOSIS — M24522 Contracture, left elbow: Secondary | ICD-10-CM | POA: Diagnosis not present

## 2018-05-26 DIAGNOSIS — M5030 Other cervical disc degeneration, unspecified cervical region: Secondary | ICD-10-CM

## 2018-05-26 DIAGNOSIS — Z79899 Other long term (current) drug therapy: Secondary | ICD-10-CM

## 2018-05-26 DIAGNOSIS — M24521 Contracture, right elbow: Secondary | ICD-10-CM

## 2018-05-26 DIAGNOSIS — M8589 Other specified disorders of bone density and structure, multiple sites: Secondary | ICD-10-CM

## 2018-05-26 NOTE — Patient Instructions (Signed)
Standing Labs We placed an order today for your standing lab work.    Please come back and get your standing labs in March and every 3 months  We have open lab Monday through Friday from 8:30-11:30 AM and 1:30-4:00 PM  at the office of Dr. Shaili Deveshwar.   You may experience shorter wait times on Monday and Friday afternoons. The office is located at [address], [address], [place], [place] [address] No appointment is necessary.   Labs are drawn by [facility].  You may receive a bill from [facility] for your lab work.  If you wish to have your labs drawn at another location, please call the office 24 hours in advance to send orders.  If you have any questions regarding directions or hours of operation,  please call [phone].   Just as a reminder please drink plenty of water prior to coming for your lab work. Thanks!  

## 2018-06-01 ENCOUNTER — Ambulatory Visit
Admission: RE | Admit: 2018-06-01 | Discharge: 2018-06-01 | Disposition: A | Discharge status: Home / Self Care | Payer: No Typology Code available for payment source | Source: Ambulatory Visit | Attending: Obstetrics & Gynecology | Admitting: Obstetrics & Gynecology

## 2018-06-01 ENCOUNTER — Other Ambulatory Visit: Payer: Self-pay | Admitting: Rheumatology

## 2018-06-01 DIAGNOSIS — Z1231 Encounter for screening mammogram for malignant neoplasm of breast: Secondary | ICD-10-CM

## 2018-06-01 NOTE — Telephone Encounter (Signed)
Last Visit: 05/26/18 Next visit: 08/31/18  Labs: 03/10/18 WNL TB Gold: 03/10/18 Neg   Okay to refill per Dr. Estanislado Pandy

## 2018-06-03 ENCOUNTER — Other Ambulatory Visit: Payer: Self-pay | Admitting: Rheumatology

## 2018-06-03 NOTE — Telephone Encounter (Signed)
Last Visit: 05/26/18 Next visit: 08/31/18   Okay to refill per Dr. Estanislado Pandy

## 2018-07-02 ENCOUNTER — Other Ambulatory Visit: Payer: Self-pay | Admitting: Rheumatology

## 2018-07-02 NOTE — Telephone Encounter (Signed)
Last Visit: 05/26/18 Next visit: 08/31/18  Labs: 03/10/18 WNL TB Gold: 03/10/18 Neg   Left message to advise patient she is due for labs.   Okay to refill per Dr. Estanislado Pandy

## 2018-07-05 ENCOUNTER — Telehealth: Payer: Self-pay | Admitting: Rheumatology

## 2018-07-05 DIAGNOSIS — M0579 Rheumatoid arthritis with rheumatoid factor of multiple sites without organ or systems involvement: Secondary | ICD-10-CM

## 2018-07-05 DIAGNOSIS — Z79899 Other long term (current) drug therapy: Secondary | ICD-10-CM

## 2018-07-05 MED ORDER — METHOTREXATE SODIUM CHEMO INJECTION 50 MG/2ML: 20.0000 mg | INTRAMUSCULAR | 0 refills | Status: DC

## 2018-07-05 NOTE — Telephone Encounter (Signed)
Patient needs refill on MTX sent to [facility] [address]. Patient going to [facility] for labs today.

## 2018-07-05 NOTE — Telephone Encounter (Signed)
Patient advised her prescription has been sent to the pharmacy.  

## 2018-07-06 LAB — CBC WITH DIFFERENTIAL/PLATELET
Absolute Monocytes: 353 cells/uL (ref 200–950)
Basophils Absolute: 82 cells/uL (ref 0–200)
Basophils Relative: 1 %
Eosinophils Absolute: 238 cells/uL (ref 15–500)
Eosinophils Relative: 2.9 %
HCT: 42.6 % (ref 35.0–45.0)
Hemoglobin: 14.1 g/dL (ref 11.7–15.5)
Lymphs Abs: 2837 cells/uL (ref 850–3900)
MCH: 27.9 pg (ref 27.0–33.0)
MCHC: 33.1 g/dL (ref 32.0–36.0)
MCV: 84.2 fL (ref 80.0–100.0)
MPV: 11.9 fL (ref 7.5–12.5)
Monocytes Relative: 4.3 %
Neutro Abs: 4690 cells/uL (ref 1500–7800)
Neutrophils Relative %: 57.2 %
Platelets: 285 10*3/uL (ref 140–400)
RBC: 5.06 10*6/uL (ref 3.80–5.10)
RDW: 13.8 % (ref 11.0–15.0)
Total Lymphocyte: 34.6 %
WBC: 8.2 10*3/uL (ref 3.8–10.8)

## 2018-07-06 LAB — SEDIMENTATION RATE: Sed Rate: 19 mm/h (ref 0–30)

## 2018-07-06 LAB — COMPLETE METABOLIC PANEL WITH GFR
AG Ratio: 1.3 (calc) (ref 1.0–2.5)
ALT: 32 U/L — ABNORMAL HIGH (ref 6–29)
AST: 28 U/L (ref 10–35)
Albumin: 4.2 g/dL (ref 3.6–5.1)
Alkaline phosphatase (APISO): 92 U/L (ref 37–153)
BUN: 11 mg/dL (ref 7–25)
CO2: 28 mmol/L (ref 20–32)
Calcium: 9.8 mg/dL (ref 8.6–10.4)
Chloride: 104 mmol/L (ref 98–110)
Creat: 0.71 mg/dL (ref 0.50–1.05)
GFR, Est African American: 111 mL/min/{1.73_m2} (ref >=60)
GFR, Est Non African American: 96 mL/min/{1.73_m2} (ref >=60)
Globulin: 3.2 g/dL (calc) (ref 1.9–3.7)
Glucose, Bld: 78 mg/dL (ref 65–99)
Potassium: 4.2 mmol/L (ref 3.5–5.3)
Sodium: 141 mmol/L (ref 135–146)
Total Bilirubin: 0.4 mg/dL (ref 0.2–1.2)
Total Protein: 7.4 g/dL (ref 6.1–8.1)

## 2018-07-06 LAB — CYCLIC CITRUL PEPTIDE ANTIBODY, IGG: Cyclic Citrullin Peptide Ab: >250 UNITS — ABNORMAL HIGH

## 2018-07-06 NOTE — Telephone Encounter (Signed)
LFTs mildly elevated. Avoid NSIADS. Reduce MTX to 0.7 ml sq  qwk.

## 2018-07-06 NOTE — Telephone Encounter (Signed)
CCP is elevated at 250. 07/23/04 CCP was 101.

## 2018-07-06 NOTE — Telephone Encounter (Signed)
Sed rate WNL-19.

## 2018-08-19 ENCOUNTER — Telehealth: Payer: Self-pay | Admitting: Rheumatology

## 2018-08-19 MED ORDER — ABATACEPT 125 MG/ML ~~LOC~~ SOAJ: 125.0000 mg | SUBCUTANEOUS | 0 refills | Status: DC

## 2018-08-19 NOTE — Telephone Encounter (Signed)
Last Visit: 05/26/18 Next visit: 08/31/18  Labs: 07/05/18 LFTs mildly elevated TB Gold: 03/10/18 Neg   Okay to refill per Dr. Estanislado Pandy

## 2018-08-19 NOTE — Progress Notes (Signed)
Office Visit Note  Patient: [name] [name]             Date of Birth: [birth year]           MRN: [id number]             PCP: Kelton Pillar, MD Referring: Kelton Pillar, MD Visit Date: 08/31/2018 Occupation: @GUAROCC @  Subjective:  Left shoulder pain.  She is on Orencia 125 mg weekly, methotrexate 0.7 mL's weekly and folic acid 1 mg 2 tablets daily.  Last TB gold negative on 03/10/2018 and will monitor yearly.  Most recent CBC/CMP within normal limits except for elevated LFTs on 07/05/2018.  Will monitor CBC/CMP every 3 months and standing orders are in place.  She received her flu vaccine in October.  Recommend Pneumovax 23, Prevnar 13, and Shingrix as indicated.  History of Present Illness: [name] [name] is a 58 y.o. female with history of seropositive rheumatoid arthritis.  She states in March will be notified her to lower her methotrexate dose due to elevation of LFTs she decided to stop the methotrexate.  She has been taking Orencia subcu injections weekly.  She states she has been having a flare with left shoulder and left elbow pain now.  She resumed methotrexate 2 weeks ago at 0.7 mL subcu weekly.  She noticed some improvement in her discomfort after restarting methotrexate.  She is having increased swelling in her hands as well.  Activities of Daily Living:  Patient reports morning stiffness for 5 minutes.   Patient Reports nocturnal pain.  Difficulty dressing/grooming: Reports Difficulty climbing stairs: Reports Difficulty getting out of chair: Denies Difficulty using hands for taps, buttons, cutlery, and/or writing: Reports  Review of Systems  Constitutional: Positive for fatigue. Negative for night sweats, weight gain and weight loss.  HENT: Positive for mouth dryness. Negative for mouth sores, trouble swallowing, trouble swallowing and nose dryness.   Eyes: Negative for pain, redness, visual disturbance and dryness.  Respiratory: Negative for cough, shortness of breath  and difficulty breathing.   Cardiovascular: Negative for chest pain, palpitations, hypertension, irregular heartbeat and swelling in legs/feet.  Gastrointestinal: Negative for blood in stool, constipation and diarrhea.  Endocrine: Negative for increased urination.  Genitourinary: Negative for vaginal dryness.  Musculoskeletal: Positive for arthralgias, joint pain, joint swelling and morning stiffness. Negative for myalgias, muscle weakness, muscle tenderness and myalgias.  Skin: Negative for color change, rash, hair loss, skin tightness, ulcers and sensitivity to sunlight.  Allergic/Immunologic: Negative for susceptible to infections.  Neurological: Negative for dizziness, memory loss, night sweats and weakness.  Hematological: Negative for swollen glands.  Psychiatric/Behavioral: Positive for sleep disturbance. Negative for depressed mood. The patient is not nervous/anxious.     PMFS History:  Patient Active Problem List   Diagnosis Date Noted  . Status post total knee replacement, bilateral 12/01/2016  . Status post bilateral foot surgery 12/01/2016  . Contracture of right elbow 06/24/2016  . Contracture of left elbow 06/24/2016  . Rheumatoid arthritis involving multiple sites with positive rheumatoid factor ([place]) 05/27/2016  . ANA positive 05/27/2016  . Lateral epicondylitis, left elbow 05/27/2016  . Trochanteric bursitis of left hip 05/27/2016  . High risk medication use 05/27/2016  . DJD (degenerative joint disease), cervical 05/27/2016  . Osteopenia of neck of left femur 05/27/2016  . Primary osteoarthritis of both feet 05/27/2016  . S/P TAH-BSO (5/30) 09/04/2012    Past Medical History:  Diagnosis Date  . Arthritis    rheumotroid  . Complication of anesthesia    "  irregular heartbeat" during neck surgery  . Fibroid   . Rheumatoid arteritis ([place])    age 76  . Urinary incontinence     Family History  Problem Relation Age of Onset  . Ovarian cancer Mother 89  . Lung  cancer Father        asbestos  . Cancer Brother 72       appendiceal  . Seizures Brother   . Colon cancer Maternal Aunt   . Breast cancer Paternal Aunt 11  . Colon cancer Maternal Grandmother 52  . Stroke Paternal Grandmother   . Heart disease Paternal Grandmother   . Breast cancer Paternal Aunt 79   Past Surgical History:  Procedure Laterality Date  . ABDOMINAL HYSTERECTOMY N/A 09/03/2012   Procedure: HYSTERECTOMY ABDOMINAL;  Surgeon: Marvene Staff, MD;  Location: [facility];  Service: Gynecology;  Laterality: N/A;  . FOOT SURGERY Bilateral    joint implants, each foot x 2  . JOINT REPLACEMENT    . KNEE SURGERY Right 2006   fracture knee after replacement, repaired  . POSTERIOR FUSION CERVICAL SPINE  2006  . REPLACEMENT TOTAL KNEE Bilateral 1989   left 1989; right 1996  . SALPINGOOPHORECTOMY Bilateral 09/03/2012   Procedure: SALPINGO OOPHORECTOMY;  Surgeon: Marvene Staff, MD;  Location: [facility];  Service: Gynecology;  Laterality: Bilateral;  . TONSILLECTOMY  1978   Social History   Social History Narrative  . Not on file   Immunization History  Administered Date(s) Administered  . Influenza,inj,Quad PF,6+ Mos 01/22/2018     Objective: Vital Signs: BP 108/74 (BP Location: Right Arm, Patient Position: Sitting, Cuff Size: Normal)   Pulse 70   Resp 14   Ht 5\' 2"  (1.575 m)   Wt 175 lb (79.4 kg) Comment: per patient, refused to weigh  LMP 04/07/2012   BMI 32.01 kg/m    Physical Exam Vitals signs and nursing note reviewed.  Constitutional:      Appearance: She is well-developed.  HENT:     Head: Normocephalic and atraumatic.  Eyes:     Conjunctiva/sclera: Conjunctivae normal.  Neck:     Musculoskeletal: Normal range of motion.  Cardiovascular:     Rate and Rhythm: Normal rate and regular rhythm.     Heart sounds: Normal heart sounds.  Pulmonary:     Effort: Pulmonary effort is normal.     Breath sounds: Normal breath sounds.  Abdominal:     General:  Bowel sounds are normal.     Palpations: Abdomen is soft.  Lymphadenopathy:     Cervical: No cervical adenopathy.  Skin:    General: Skin is warm and dry.     Capillary Refill: Capillary refill takes less than 2 seconds.  Neurological:     Mental Status: She is alert and oriented to person, place, and time.  Psychiatric:        Behavior: Behavior normal.      Musculoskeletal Exam: Patient has very limited range of motion of her cervical spine.  Shoulder joints were in fairly good range of motion.  She has bilateral elbow joint contractures.  She has limited range of motion of her wrist joints with synovial thickening.  She has synovial thickening over MCP joints without any synovitis.  She is in complete fist formation.  PIP joints had limited range of motion.  She has bilateral total knee replacement appears to be doing well.  She has limited range of motion of ankle joints with ankle joint thickening.  MTP and  PIP changes were noted due to osteoarthritis.  No synovitis was noted.  CDAI Exam: CDAI Score: 3  Patient Global Assessment: 5 (mm); Provider Global Assessment: 5 (mm) Swollen: 0 ; Tender: 2  Joint Exam      Right  Left  Glenohumeral      Tender  Elbow      Tender     Investigation: No additional findings.  Imaging: No results found.  Recent Labs: Lab Results  Component Value Date   WBC 8.2 07/05/2018   HGB 14.1 07/05/2018   PLT 285 07/05/2018   NA 141 07/05/2018   K 4.2 07/05/2018   CL 104 07/05/2018   CO2 28 07/05/2018   GLUCOSE 78 07/05/2018   BUN 11 07/05/2018   CREATININE 0.71 07/05/2018   BILITOT 0.4 07/05/2018   ALKPHOS 83 10/18/2016   AST 28 07/05/2018   ALT 32 (H) 07/05/2018   PROT 7.4 07/05/2018   ALBUMIN 4.0 10/18/2016   CALCIUM 9.8 07/05/2018   GFRAA 111 07/05/2018   QFTBGOLD NEGATIVE 02/06/2017   QFTBGOLDPLUS NEGATIVE 03/10/2018    Speciality Comments: No specialty comments available.  Procedures:  No procedures performed Allergies:  Sulfa antibiotics; Morphine and related; and Penicillins   Assessment / Plan:     Visit Diagnoses: Rheumatoid arthritis involving multiple sites with positive rheumatoid factor (HCC) - RF+, CCP+, ANA+, erosive disease: She has had severe disease involving multiple joints.  She has multiple contractures and synovial thickening.  She had recent flare when she came off methotrexate.  She is resume methotrexate couple of weeks ago.  She had mild elevation of LFTs on methotrexate 0.8 mL subcu weekly.  High risk medication use - Orencia 125 mg subcutaneous injections weekly, methotrexate 0.7 mL weekly, and folic acid 2 mg by mouth daily - Plan: CBC with Differential/Platelet, COMPLETE METABOLIC PANEL WITH GFR today and then every 3 months.  Acute pain of left shoulder-she has been having discomfort in her left shoulder joint.  She believes she had a flare because of coming off methotrexate.  I offered x-ray and injection which she declined.  Contracture of left elbow-chronic  Contracture of right elbow-chronic  Trochanteric bursitis of right hip-she continues to have some discomfort in her trochanteric area.  Status post total knee replacement, bilateral-she is doing fairly well without much discomfort.  DDD (degenerative disc disease), cervical-she has limited range of motion of her cervical spine.  Osteopenia of multiple sites -she is on calcium and vitamin D.  Orders: Orders Placed This Encounter  Procedures  . CBC with Differential/Platelet  . COMPLETE METABOLIC PANEL WITH GFR   No orders of the defined types were placed in this encounter.   Face-to-face time spent with patient was 30 minutes. Greater than 50% of time was spent in counseling and coordination of care.  Follow-Up Instructions: Return in about 5 months (around 01/31/2019) for Rheumatoid arthritis.   Bo Merino, MD  Note - This record has been created using Editor, commissioning.  Chart creation errors have been  sought, but may not always  have been located. Such creation errors do not reflect on  the standard of medical care.

## 2018-08-19 NOTE — Telephone Encounter (Signed)
Patient needs a refill on Orencia sent to [facility].

## 2018-08-31 ENCOUNTER — Ambulatory Visit: Payer: No Typology Code available for payment source | Admitting: Rheumatology

## 2018-08-31 ENCOUNTER — Encounter: Payer: Self-pay | Admitting: Rheumatology

## 2018-08-31 ENCOUNTER — Other Ambulatory Visit: Payer: Self-pay

## 2018-08-31 VITALS — BP 108/74 | HR 70 | Resp 14 | Ht 62.0 in | Wt 175.0 lb

## 2018-08-31 DIAGNOSIS — Z79899 Other long term (current) drug therapy: Secondary | ICD-10-CM

## 2018-08-31 DIAGNOSIS — M24522 Contracture, left elbow: Secondary | ICD-10-CM | POA: Diagnosis not present

## 2018-08-31 DIAGNOSIS — M7061 Trochanteric bursitis, right hip: Secondary | ICD-10-CM

## 2018-08-31 DIAGNOSIS — M25512 Pain in left shoulder: Secondary | ICD-10-CM

## 2018-08-31 DIAGNOSIS — Z96653 Presence of artificial knee joint, bilateral: Secondary | ICD-10-CM

## 2018-08-31 DIAGNOSIS — M8589 Other specified disorders of bone density and structure, multiple sites: Secondary | ICD-10-CM

## 2018-08-31 DIAGNOSIS — M24521 Contracture, right elbow: Secondary | ICD-10-CM

## 2018-08-31 DIAGNOSIS — M0579 Rheumatoid arthritis with rheumatoid factor of multiple sites without organ or systems involvement: Secondary | ICD-10-CM | POA: Diagnosis not present

## 2018-08-31 DIAGNOSIS — M5030 Other cervical disc degeneration, unspecified cervical region: Secondary | ICD-10-CM

## 2018-08-31 NOTE — Patient Instructions (Signed)
Standing Labs We placed an order today for your standing lab work.    Please come back and get your standing labs in August and every 3 months  We have open lab daily Monday through Thursday from 8:30-12:30 PM and 1:30-4:30 PM and Friday from 8:30-12:30 PM and 1:30 -4:00 PM at the office of Dr. Emilianna Barlowe.   You may experience shorter wait times on Monday and Friday afternoons. The office is located at [address], [address], [place], [place] [address] No appointment is necessary.   Labs are drawn by [facility].  You may receive a bill from [facility] for your lab work.  If you wish to have your labs drawn at another location, please call the office 24 hours in advance to send orders.  If you have any questions regarding directions or hours of operation,  please call [phone].   Just as a reminder please drink plenty of water prior to coming for your lab work. Thanks!   

## 2018-09-01 LAB — COMPLETE METABOLIC PANEL WITH GFR
AG Ratio: 1.2 (calc) (ref 1.0–2.5)
ALT: 11 U/L (ref 6–29)
AST: 16 U/L (ref 10–35)
Albumin: 3.9 g/dL (ref 3.6–5.1)
Alkaline phosphatase (APISO): 84 U/L (ref 37–153)
BUN: 15 mg/dL (ref 7–25)
CO2: 30 mmol/L (ref 20–32)
Calcium: 10 mg/dL (ref 8.6–10.4)
Chloride: 104 mmol/L (ref 98–110)
Creat: 0.72 mg/dL (ref 0.50–1.05)
GFR, Est African American: 109 mL/min/{1.73_m2} (ref >=60)
GFR, Est Non African American: 94 mL/min/{1.73_m2} (ref >=60)
Globulin: 3.3 g/dL (calc) (ref 1.9–3.7)
Glucose, Bld: 105 mg/dL — ABNORMAL HIGH (ref 65–99)
Potassium: 4.2 mmol/L (ref 3.5–5.3)
Sodium: 141 mmol/L (ref 135–146)
Total Bilirubin: 0.3 mg/dL (ref 0.2–1.2)
Total Protein: 7.2 g/dL (ref 6.1–8.1)

## 2018-09-01 LAB — CBC WITH DIFFERENTIAL/PLATELET
Absolute Monocytes: 387 cells/uL (ref 200–950)
Basophils Absolute: 77 cells/uL (ref 0–200)
Basophils Relative: 0.9 %
Eosinophils Absolute: 241 cells/uL (ref 15–500)
Eosinophils Relative: 2.8 %
HCT: 40.8 % (ref 35.0–45.0)
Hemoglobin: 13.3 g/dL (ref 11.7–15.5)
Lymphs Abs: 3173 cells/uL (ref 850–3900)
MCH: 27.8 pg (ref 27.0–33.0)
MCHC: 32.6 g/dL (ref 32.0–36.0)
MCV: 85.4 fL (ref 80.0–100.0)
MPV: 11.1 fL (ref 7.5–12.5)
Monocytes Relative: 4.5 %
Neutro Abs: 4721 cells/uL (ref 1500–7800)
Neutrophils Relative %: 54.9 %
Platelets: 342 10*3/uL (ref 140–400)
RBC: 4.78 10*6/uL (ref 3.80–5.10)
RDW: 13.1 % (ref 11.0–15.0)
Total Lymphocyte: 36.9 %
WBC: 8.6 10*3/uL (ref 3.8–10.8)

## 2018-09-05 ENCOUNTER — Other Ambulatory Visit: Payer: Self-pay | Admitting: Rheumatology

## 2018-09-06 NOTE — Telephone Encounter (Signed)
Last Visit: 08/31/18 Next Visit: 02/01/19  Okay to refill per Dr. Estanislado Pandy

## 2018-11-03 ENCOUNTER — Other Ambulatory Visit: Payer: Self-pay | Admitting: Rheumatology

## 2018-11-03 NOTE — Telephone Encounter (Signed)
Last Visit: 08/31/18 Next Visit: 02/01/19 Labs: 08/31/18 CBC WNL. Glucose is 105. Rest of CMP WNL TB Gold: 03/10/18 Neg  Okay to refill per Dr. Estanislado Pandy

## 2018-11-15 ENCOUNTER — Telehealth: Payer: Self-pay | Admitting: Pharmacist

## 2018-11-15 NOTE — Telephone Encounter (Signed)
Received notification from [facility] regarding a prior authorization for [facility]. Authorization has been APPROVED from 11/15/2018 to 11/15/2019.   Will send document to scan center.  Phone # [phone]

## 2018-11-15 NOTE — Telephone Encounter (Signed)
Received Prior Authorization Request from [facility] for [name] [name].  Filled out request form and faxed back to [phone].  Will update when we received a response.

## 2018-12-15 ENCOUNTER — Telehealth: Payer: Self-pay | Admitting: Rheumatology

## 2018-12-15 NOTE — Telephone Encounter (Signed)
Attempted to contact the patient and left message for patient to call the office.  

## 2018-12-15 NOTE — Telephone Encounter (Signed)
Patient going to go out of town this weekend, and would like to know how to travel with her [name] [name]. Please call to advise.

## 2018-12-27 NOTE — Progress Notes (Signed)
Office Visit Note  Patient: [name] [name]             Date of Birth: June 02, 1960           MRN: [id number]             PCP: Kelton Pillar, MD Referring: Kelton Pillar, MD Visit Date: 12/30/2018 Occupation: _0 @  Subjective:  Left knee pain.  History of Present Illness: [name] [name] is a 58 y.o. female with history of rheumatoid arthritis and DDD. She is on MTX 0.7 ml sq once weekly, folic acid 2 mg po daily, and Orencia sq weekly injections.  She states her left knee joint has been hurting and popping.  She had bilateral knee joint replacement.  None of the other joints are as painful.  Activities of Daily Living:  Patient reports morning stiffness for 5 minutes.   Patient Reports nocturnal pain.  Difficulty dressing/grooming: Denies Difficulty climbing stairs: Reports Difficulty getting out of chair: Reports Difficulty using hands for taps, buttons, cutlery, and/or writing: Reports  Review of Systems  Constitutional: Positive for fatigue. Negative for night sweats, weight gain and weight loss.  HENT: Negative for mouth sores, trouble swallowing, trouble swallowing, mouth dryness and nose dryness.   Eyes: Negative for pain, redness, visual disturbance and dryness.  Respiratory: Negative for cough, hemoptysis, shortness of breath and difficulty breathing.   Cardiovascular: Negative for chest pain, palpitations, hypertension, irregular heartbeat and swelling in legs/feet.  Gastrointestinal: Negative for blood in stool, constipation and diarrhea.  Endocrine: Negative for increased urination.  Genitourinary: Negative for painful urination and vaginal dryness.  Musculoskeletal: Positive for arthralgias, joint pain and morning stiffness. Negative for joint swelling, myalgias, muscle weakness, muscle tenderness and myalgias.  Skin: Negative for color change, pallor, rash, hair loss, nodules/bumps, skin tightness, ulcers and sensitivity to sunlight.  Allergic/Immunologic:  Negative for susceptible to infections.  Neurological: Negative for dizziness, numbness, headaches, memory loss, night sweats and weakness.  Hematological: Negative for swollen glands.  Psychiatric/Behavioral: Negative for depressed mood and sleep disturbance. The patient is not nervous/anxious.     PMFS History:  Patient Active Problem List   Diagnosis Date Noted   Status post total knee replacement, bilateral 12/01/2016   Status post bilateral foot surgery 12/01/2016   Contracture of right elbow 06/24/2016   Contracture of left elbow 06/24/2016   Rheumatoid arthritis involving multiple sites with positive rheumatoid factor ([place]) 05/27/2016   ANA positive 05/27/2016   Lateral epicondylitis, left elbow 05/27/2016   Trochanteric bursitis of left hip 05/27/2016   High risk medication use 05/27/2016   DJD (degenerative joint disease), cervical 05/27/2016   Osteopenia of neck of left femur 05/27/2016   Primary osteoarthritis of both feet 05/27/2016   S/P TAH-BSO (5/30) 09/04/2012    Past Medical History:  Diagnosis Date   Arthritis    rheumotroid   Complication of anesthesia    "irregular heartbeat" during neck surgery   Fibroid    Rheumatoid arteritis ([place])    age 53   Urinary incontinence     Family History  Problem Relation Age of Onset   Ovarian cancer Mother 48   Lung cancer Father        asbestos   Cancer Brother 39       appendiceal   Seizures Brother    Heart Problems Brother    Colon cancer Maternal Aunt    Breast cancer Paternal Aunt 64   Colon cancer Maternal Grandmother 18   Stroke Paternal Grandmother  Heart disease Paternal Grandmother    Breast cancer Paternal Aunt 74   Past Surgical History:  Procedure Laterality Date   ABDOMINAL HYSTERECTOMY N/A 09/03/2012   Procedure: HYSTERECTOMY ABDOMINAL;  Surgeon: Marvene Staff, MD;  Location: [facility];  Service: Gynecology;  Laterality: N/A;   FOOT SURGERY Bilateral     joint implants, each foot x 2   JOINT REPLACEMENT     KNEE SURGERY Right 2006   fracture knee after replacement, repaired   POSTERIOR FUSION CERVICAL SPINE  2006   REPLACEMENT TOTAL KNEE Bilateral 1989   left 1989; right 1996   SALPINGOOPHORECTOMY Bilateral 09/03/2012   Procedure: SALPINGO OOPHORECTOMY;  Surgeon: Marvene Staff, MD;  Location: [facility];  Service: Gynecology;  Laterality: Bilateral;   TONSILLECTOMY  1978   Social History   Social History Narrative   Not on file   Immunization History  Administered Date(s) Administered   Influenza,inj,Quad PF,6+ Mos 01/22/2018     Objective: Vital Signs: BP 128/79 (BP Location: Left Arm, Patient Position: Sitting, Cuff Size: Normal)    Pulse 60    Resp 13    Ht _0  (1.575 m)    Wt 176 lb (79.8 kg) Comment: per patient, refused to weigh   LMP 04/07/2012    BMI 32.19 kg/m    Physical Exam Vitals signs and nursing note reviewed.  Constitutional:      Appearance: She is well-developed.  HENT:     Head: Normocephalic and atraumatic.  Eyes:     Conjunctiva/sclera: Conjunctivae normal.  Neck:     Musculoskeletal: Normal range of motion.  Cardiovascular:     Rate and Rhythm: Normal rate and regular rhythm.     Heart sounds: Normal heart sounds.  Pulmonary:     Effort: Pulmonary effort is normal.     Breath sounds: Normal breath sounds.  Abdominal:     General: Bowel sounds are normal.     Palpations: Abdomen is soft.  Lymphadenopathy:     Cervical: No cervical adenopathy.  Skin:    General: Skin is warm and dry.     Capillary Refill: Capillary refill takes less than 2 seconds.  Neurological:     Mental Status: She is alert and oriented to person, place, and time.  Psychiatric:        Behavior: Behavior normal.      Musculoskeletal Exam: She has limited range of motion of her cervical spine.  She has limited range of motion of shoulder joints.  She has contracture in her bilateral elbow joints.  She has no  range of motion in her wrist joints.  She has subluxation of almost all MCP joints with MCP thickening but no active synovitis.  She has PIP changes with no synovitis.  Hip joints had limited range of motion.  She has bilateral total knee replacement.  She had discomfort range of motion of her left knee joint.  She has ankle joint limited range of motion.  She has postsurgical changes in her feet.  CDAI Exam: CDAI Score: 1.2  Patient Global: 1 mm; Provider Global: 1 mm Swollen: 0 ; Tender: 1  Joint Exam      Right  Left  Knee      Tender     Investigation: No additional findings.  Imaging: No results found.  Recent Labs: Lab Results  Component Value Date   WBC 8.6 08/31/2018   HGB 13.3 08/31/2018   PLT 342 08/31/2018   NA 141 08/31/2018  K 4.2 08/31/2018   CL 104 08/31/2018   CO2 30 08/31/2018   GLUCOSE 105 (H) 08/31/2018   BUN 15 08/31/2018   CREATININE 0.72 08/31/2018   BILITOT 0.3 08/31/2018   ALKPHOS 83 10/18/2016   AST 16 08/31/2018   ALT 11 08/31/2018   PROT 7.2 08/31/2018   ALBUMIN 4.0 10/18/2016   CALCIUM 10.0 08/31/2018   GFRAA 109 08/31/2018   QFTBGOLD NEGATIVE 02/06/2017   QFTBGOLDPLUS NEGATIVE 03/10/2018    Speciality Comments: No specialty comments available.  Procedures:  No procedures performed Allergies: Sulfa antibiotics, Morphine and related, and Penicillins   Assessment / Plan:     Visit Diagnoses: Rheumatoid arthritis involving multiple sites with positive rheumatoid factor (HCC) - RF+, CCP+, ANA+, erosive disease.  Patient has been experiencing increased pain and discomfort in her left knee.  She states she had knee joint replacement several years ago.  None of the other joints are as painful.  She denies any increased joint swelling.  She has been tolerating medications well.  High risk medication use - Orencia 125 mg subcutaneous injections weekly, methotrexate 0.7 mL weekly, and folic acid 2 mg by mouth daily - Plan: CBC with  Differential/Platelet, COMPLETE METABOLIC PANEL WITH GFR today.  Acute pain of left shoulder-patient had recent injury to her left shoulder and having some discomfort.  Contracture of right elbow-chronic due to rheumatoid arthritis  Contracture of left elbow  Pain in both hands -she has been having some stiffness in her hands.  The x-rays did not show any radiographic progression.  Plan: XR Hand 2 View Right, XR Hand 2 View Left  Trochanteric bursitis of right hip-she is intermittent pain.  She will continue to do some stretches.  Acute pain of left knee -I reviewed the x-ray with Dr. Durward Fortes today.  It showed the prosthesis is in place.  She has some cystic changes in her tibia.  He will evaluate her next week for acute pain.  Plan: XR KNEE 3 VIEW LEFT.  We will get ESR and CRP today.  Status post total knee replacement, bilateral  Pain in both feet -she has severe end-stage rheumatoid arthritis.  There was no radiographic progression.  Plan: XR Foot 2 Views Right, XR Foot 2 Views Left  DDD (degenerative disc disease), cervical-she has limited range of motion.  Osteopenia of multiple sites-she is on calcium and vitamin D.  Orders: Orders Placed This Encounter  Procedures   XR Hand 2 View Right   XR Hand 2 View Left   XR Foot 2 Views Right   XR Foot 2 Views Left   XR KNEE 3 VIEW LEFT   CBC with Differential/Platelet   COMPLETE METABOLIC PANEL WITH GFR   No orders of the defined types were placed in this encounter.   Face-to-face time spent with patient was 30 minutes. Greater than 50% of time was spent in counseling and coordination of care.  Follow-Up Instructions: Return in 5 months (on 06/01/2019) for Rheumatoid arthritis, DDD.   Bo Merino, MD  Note - This record has been created using Editor, commissioning.  Chart creation errors have been sought, but may not always  have been located. Such creation errors do not reflect on  the standard of medical care.

## 2018-12-30 ENCOUNTER — Ambulatory Visit: Payer: Self-pay

## 2018-12-30 ENCOUNTER — Encounter: Payer: Self-pay | Admitting: Rheumatology

## 2018-12-30 ENCOUNTER — Ambulatory Visit: Payer: No Typology Code available for payment source | Admitting: Rheumatology

## 2018-12-30 ENCOUNTER — Other Ambulatory Visit: Payer: Self-pay

## 2018-12-30 VITALS — BP 128/79 | HR 60 | Resp 13 | Ht 62.0 in | Wt 176.0 lb

## 2018-12-30 DIAGNOSIS — M8589 Other specified disorders of bone density and structure, multiple sites: Secondary | ICD-10-CM

## 2018-12-30 DIAGNOSIS — M25562 Pain in left knee: Secondary | ICD-10-CM

## 2018-12-30 DIAGNOSIS — M79642 Pain in left hand: Secondary | ICD-10-CM | POA: Diagnosis not present

## 2018-12-30 DIAGNOSIS — M79641 Pain in right hand: Secondary | ICD-10-CM

## 2018-12-30 DIAGNOSIS — M79671 Pain in right foot: Secondary | ICD-10-CM

## 2018-12-30 DIAGNOSIS — M79672 Pain in left foot: Secondary | ICD-10-CM

## 2018-12-30 DIAGNOSIS — M24521 Contracture, right elbow: Secondary | ICD-10-CM | POA: Diagnosis not present

## 2018-12-30 DIAGNOSIS — M25512 Pain in left shoulder: Secondary | ICD-10-CM | POA: Diagnosis not present

## 2018-12-30 DIAGNOSIS — M24522 Contracture, left elbow: Secondary | ICD-10-CM

## 2018-12-30 DIAGNOSIS — Z96653 Presence of artificial knee joint, bilateral: Secondary | ICD-10-CM

## 2018-12-30 DIAGNOSIS — M5030 Other cervical disc degeneration, unspecified cervical region: Secondary | ICD-10-CM

## 2018-12-30 DIAGNOSIS — Z79899 Other long term (current) drug therapy: Secondary | ICD-10-CM

## 2018-12-30 DIAGNOSIS — M7061 Trochanteric bursitis, right hip: Secondary | ICD-10-CM

## 2018-12-30 DIAGNOSIS — M0579 Rheumatoid arthritis with rheumatoid factor of multiple sites without organ or systems involvement: Secondary | ICD-10-CM | POA: Diagnosis not present

## 2018-12-31 LAB — CBC WITH DIFFERENTIAL/PLATELET
Absolute Monocytes: 424 cells/uL (ref 200–950)
Basophils Absolute: 80 cells/uL (ref 0–200)
Basophils Relative: 1 %
Eosinophils Absolute: 312 cells/uL (ref 15–500)
Eosinophils Relative: 3.9 %
HCT: 41.2 % (ref 35.0–45.0)
Hemoglobin: 13.4 g/dL (ref 11.7–15.5)
Lymphs Abs: 3224 cells/uL (ref 850–3900)
MCH: 27.6 pg (ref 27.0–33.0)
MCHC: 32.5 g/dL (ref 32.0–36.0)
MCV: 84.9 fL (ref 80.0–100.0)
MPV: 11.2 fL (ref 7.5–12.5)
Monocytes Relative: 5.3 %
Neutro Abs: 3960 cells/uL (ref 1500–7800)
Neutrophils Relative %: 49.5 %
Platelets: 320 10*3/uL (ref 140–400)
RBC: 4.85 10*6/uL (ref 3.80–5.10)
RDW: 13.9 % (ref 11.0–15.0)
Total Lymphocyte: 40.3 %
WBC: 8 10*3/uL (ref 3.8–10.8)

## 2018-12-31 LAB — COMPLETE METABOLIC PANEL WITH GFR
AG Ratio: 1.5 (calc) (ref 1.0–2.5)
ALT: 13 U/L (ref 6–29)
AST: 18 U/L (ref 10–35)
Albumin: 4.1 g/dL (ref 3.6–5.1)
Alkaline phosphatase (APISO): 89 U/L (ref 37–153)
BUN: 11 mg/dL (ref 7–25)
CO2: 29 mmol/L (ref 20–32)
Calcium: 9.8 mg/dL (ref 8.6–10.4)
Chloride: 103 mmol/L (ref 98–110)
Creat: 0.75 mg/dL (ref 0.50–1.05)
GFR, Est African American: 104 mL/min/{1.73_m2} (ref >=60)
GFR, Est Non African American: 90 mL/min/{1.73_m2} (ref >=60)
Globulin: 2.8 g/dL (calc) (ref 1.9–3.7)
Glucose, Bld: 81 mg/dL (ref 65–99)
Potassium: 4.4 mmol/L (ref 3.5–5.3)
Sodium: 140 mmol/L (ref 135–146)
Total Bilirubin: 0.5 mg/dL (ref 0.2–1.2)
Total Protein: 6.9 g/dL (ref 6.1–8.1)

## 2018-12-31 LAB — C-REACTIVE PROTEIN: CRP: 17.4 mg/L — ABNORMAL HIGH (ref <8.0)

## 2018-12-31 LAB — SEDIMENTATION RATE: Sed Rate: 19 mm/h (ref 0–30)

## 2019-01-05 ENCOUNTER — Ambulatory Visit: Payer: No Typology Code available for payment source | Admitting: Orthopaedic Surgery

## 2019-01-24 ENCOUNTER — Other Ambulatory Visit: Payer: Self-pay

## 2019-01-24 ENCOUNTER — Ambulatory Visit: Payer: No Typology Code available for payment source

## 2019-01-24 ENCOUNTER — Encounter: Payer: Self-pay | Admitting: Orthopedic Surgery

## 2019-01-24 ENCOUNTER — Ambulatory Visit: Payer: No Typology Code available for payment source | Admitting: Orthopedic Surgery

## 2019-01-24 VITALS — BP 118/54 | HR 67 | Ht 62.0 in

## 2019-01-24 DIAGNOSIS — Z96652 Presence of left artificial knee joint: Secondary | ICD-10-CM

## 2019-01-24 DIAGNOSIS — M25562 Pain in left knee: Secondary | ICD-10-CM

## 2019-01-24 NOTE — Progress Notes (Signed)
[name] [name]  01/24/2019  HISTORY SECTION :  Chief Complaint  Patient presents with  . Knee Pain    left knee pain s/p missed step in garage 1 month ago    [name] [name] is 58 years old she has rheumatoid arthritis fairly heavy disease burden followed by Dr. Thea Silversmith status post bilateral total knees the left 20 1989 and a revision which sounds like a polyethylene exchange in 2001 in [place] who presents with popping sensation in the left knee since she missed a step in her garage about 2 to 3 weeks ago.  Initially painful somewhat resolving although still clicking.  Denies swelling catching locking giving way but does report difficulty getting out of a low seat   Review of Systems  Musculoskeletal: Positive for joint pain.  All other systems reviewed and are negative.    has a past medical history of Arthritis, Complication of anesthesia, Fibroid, Rheumatoid arteritis ([place]), and Urinary incontinence.   Past Surgical History:  Procedure Laterality Date  . ABDOMINAL HYSTERECTOMY N/A 09/03/2012   Procedure: HYSTERECTOMY ABDOMINAL;  Surgeon: Marvene Staff, MD;  Location: [facility];  Service: Gynecology;  Laterality: N/A;  . FOOT SURGERY Bilateral    joint implants, each foot x 2  . JOINT REPLACEMENT    . KNEE SURGERY Right 2006   fracture knee after replacement, repaired  . POSTERIOR FUSION CERVICAL SPINE  2006  . REPLACEMENT TOTAL KNEE Bilateral 1989   left 1989; right 1996  . SALPINGOOPHORECTOMY Bilateral 09/03/2012   Procedure: SALPINGO OOPHORECTOMY;  Surgeon: Marvene Staff, MD;  Location: [facility];  Service: Gynecology;  Laterality: Bilateral;  . TONSILLECTOMY  1978    Body mass index is 32.19 kg/m.   Allergies  Allergen Reactions  . Sulfa Antibiotics Shortness Of Breath  . Morphine And Related Rash  . Penicillins Rash     Current Outpatient Medications:  .  acetaminophen (TYLENOL) 500 MG tablet, Take 1,000 mg by mouth every 6 (six) hours as needed  for pain., Disp: , Rfl:  .  Calcium Carb-Cholecalciferol (CALCIUM 1000 + D PO), Take by mouth daily., Disp: , Rfl:  .  citalopram (CELEXA) 20 MG tablet, Take 1 tablet (20 mg total) by mouth daily. (Patient taking differently: Take 20 mg by mouth as needed. ), Disp: 30 tablet, Rfl: 5 .  folic acid (FOLVITE) 1 MG tablet, TAKE 2 TABLETS EVERY DAY, Disp: 180 tablet, Rfl: 0 .  methotrexate 50 MG/2ML injection, Inject 0.8 mLs (20 mg total) into the skin once a week. (Patient taking differently: Inject 17.5 mg into the skin once a week. Inject 0.58mL once weekly), Disp: 10 mL, Rfl: 0 .  NONFORMULARY OR COMPOUNDED ITEM, Vit E vaginal suppositories 200u/ml.  One pv three times weekly. (Patient taking differently: as needed. Vit E vaginal suppositories 200u/ml.  One pv three times weekly.), Disp: 36 each, Rfl: 3 .  ORENCIA CLICKJECT 0000000 MG/ML SOAJ, INJECT ONE CLICKJECT PEN SUBCUTANEOUSLY EVERY WEEK. REFRIGERATE. ALLOWTO WARM TO ROOM TEMPERATURE PRIOR TO ADMINISTRATION., Disp: 12 mL, Rfl: 0 .  tolterodine (DETROL LA) 4 MG 24 hr capsule, Take 4 mg by mouth daily., Disp: , Rfl: 1 .  TUBERCULIN SYR 1CC/27GX1/2" 27G X 1/2" 1 ML MISC, Once a week with methotrexate, Disp: 12 each, Rfl: 4   PHYSICAL EXAM SECTION: 1) BP (!) 118/54   Pulse 67   Ht 5\' 2"  (1.575 m)   LMP 04/07/2012   BMI 32.19 kg/m   Body mass index is  32.19 kg/m. General appearance: Well-developed well-nourished no gross deformities  2) Cardiovascular normal pulse and perfusion in the UPPER AND LOWER  extremities normal color without edema  3) Neurologically deep tendon reflexes are equal and normal, no sensation loss or deficits no pathologic reflexes  4) Psychological: Awake alert and oriented x3 mood and affect normal  5) Skin no lacerations or ulcerations no nodularity no palpable masses, no erythema or nodularity  6) Musculoskeletal:   Right knee no swelling or tenderness no pain normal alignment normal strength  LEFT KNEE:    Well-healed midline incision At the mid no joint swelling  The knee is in slight valgus but it feels stable at 0 degrees and in flexion.  She can bring the knee up to full extension with good strength and when she flexes to 70 degrees to 90 degrees there is a clunk which appears to be in the patellofemoral joint muscle tone otherwise normal   MEDICAL DECISION SECTION:  Encounter Diagnoses  Name Primary?  . Left knee pain, unspecified chronicity Yes  . Status post total left knee replacement     Imaging See x-ray report  X-ray does show a well fixed prosthesis with 1 subtibial tray lucency at the lateral margin which does not appear to be significant she does have some bone laterally seen on the axial view but the patella seems to be articulating normally  Plan:  (Rx., Inj., surg., Frx, MRI/CT, XR:2) Recommend 4 to 6-week follow-up with observation hopefully this will resolve on its own.  It appears to be occurring in 70 degrees of flexion to 90 degrees of flexion which may indicate its inferior part of the patella at the junction with the patellar implant that is causing this.  The patellar clunk syndrome seen in posterior stabilized knees occurs more likely at 30 degrees flexion  In any event we discussed possible need for revision or at least excision of scar if this does not resolve  Follow-up 4 to 6 weeks   10:33 AM Arther Abbott, MD  01/24/2019

## 2019-02-01 ENCOUNTER — Ambulatory Visit: Payer: No Typology Code available for payment source | Admitting: Rheumatology

## 2019-02-21 ENCOUNTER — Ambulatory Visit: Payer: No Typology Code available for payment source | Admitting: Orthopedic Surgery

## 2019-02-28 ENCOUNTER — Encounter: Payer: Self-pay | Admitting: Orthopedic Surgery

## 2019-02-28 ENCOUNTER — Other Ambulatory Visit: Payer: Self-pay

## 2019-02-28 ENCOUNTER — Ambulatory Visit: Payer: No Typology Code available for payment source | Admitting: Orthopedic Surgery

## 2019-02-28 VITALS — BP 123/85 | HR 61 | Ht 62.0 in | Wt 173.0 lb

## 2019-02-28 DIAGNOSIS — G8929 Other chronic pain: Secondary | ICD-10-CM | POA: Diagnosis not present

## 2019-02-28 DIAGNOSIS — Z96652 Presence of left artificial knee joint: Secondary | ICD-10-CM | POA: Diagnosis not present

## 2019-02-28 DIAGNOSIS — M25562 Pain in left knee: Secondary | ICD-10-CM

## 2019-02-28 NOTE — Progress Notes (Signed)
Chief Complaint  Patient presents with  . Knee Pain    left / still same    This is a 58 year old female with rheumatoid arthritis fairly heavy disease burden followed by rheumatology who had bilateral total knees with first surgery and 1989's polyethylene exchange in 2001 [place] came in after missing a step in her garage early October 2020.  Came in with a clicking sensation  After 4 weeks there has been no improvement she still has pain along the quadriceps and still has a clicking sensation around the patella  X-ray shows asymmetric polyethylene wear with a 12 degree valgus alignment to the left knee no loosening of the implants except for 1 small radiolucent area in the tibia  Exam shows full extension of the knee she still can bend it she still has a clicking sensation is stable  She does not want to have surgery because of Covid  Recommend continued observation follow-up by phone or visit when she is ready to have surgery.  We will have to make a referral

## 2019-04-04 ENCOUNTER — Telehealth: Payer: Self-pay | Admitting: Rheumatology

## 2019-04-04 DIAGNOSIS — Z111 Encounter for screening for respiratory tuberculosis: Secondary | ICD-10-CM

## 2019-04-04 DIAGNOSIS — Z79899 Other long term (current) drug therapy: Secondary | ICD-10-CM

## 2019-04-04 NOTE — Telephone Encounter (Signed)
Patient left a voicemail requesting a return call to let her know when she is due for labwork.   

## 2019-04-04 NOTE — Telephone Encounter (Signed)
Advised patient she is due to update labs, patient verbalized understanding and will go to [facility] tomorrow morning. [facility] for [facility].

## 2019-04-06 NOTE — Telephone Encounter (Signed)
CBC and CMP are within normal limits.

## 2019-04-07 LAB — CBC WITH DIFFERENTIAL/PLATELET
Absolute Monocytes: 420 cells/uL (ref 200–950)
Basophils Absolute: 113 cells/uL (ref 0–200)
Basophils Relative: 1.5 %
Eosinophils Absolute: 173 cells/uL (ref 15–500)
Eosinophils Relative: 2.3 %
HCT: 43.2 % (ref 35.0–45.0)
Hemoglobin: 14.1 g/dL (ref 11.7–15.5)
Lymphs Abs: 2925 cells/uL (ref 850–3900)
MCH: 27.4 pg (ref 27.0–33.0)
MCHC: 32.6 g/dL (ref 32.0–36.0)
MCV: 83.9 fL (ref 80.0–100.0)
MPV: 11.4 fL (ref 7.5–12.5)
Monocytes Relative: 5.6 %
Neutro Abs: 3870 cells/uL (ref 1500–7800)
Neutrophils Relative %: 51.6 %
Platelets: 305 10*3/uL (ref 140–400)
RBC: 5.15 10*6/uL — ABNORMAL HIGH (ref 3.80–5.10)
RDW: 13 % (ref 11.0–15.0)
Total Lymphocyte: 39 %
WBC: 7.5 10*3/uL (ref 3.8–10.8)

## 2019-04-07 LAB — COMPLETE METABOLIC PANEL WITH GFR
AG Ratio: 1.3 (calc) (ref 1.0–2.5)
ALT: 9 U/L (ref 6–29)
AST: 15 U/L (ref 10–35)
Albumin: 4.1 g/dL (ref 3.6–5.1)
Alkaline phosphatase (APISO): 92 U/L (ref 37–153)
BUN: 10 mg/dL (ref 7–25)
CO2: 28 mmol/L (ref 20–32)
Calcium: 9.7 mg/dL (ref 8.6–10.4)
Chloride: 102 mmol/L (ref 98–110)
Creat: 0.77 mg/dL (ref 0.50–1.05)
GFR, Est African American: 100 mL/min/{1.73_m2} (ref >=60)
GFR, Est Non African American: 86 mL/min/{1.73_m2} (ref >=60)
Globulin: 3.2 g/dL (calc) (ref 1.9–3.7)
Glucose, Bld: 78 mg/dL (ref 65–99)
Potassium: 4.6 mmol/L (ref 3.5–5.3)
Sodium: 140 mmol/L (ref 135–146)
Total Bilirubin: 0.5 mg/dL (ref 0.2–1.2)
Total Protein: 7.3 g/dL (ref 6.1–8.1)

## 2019-04-07 LAB — QUANTIFERON-TB GOLD PLUS
Mitogen-NIL: >10 IU/mL
NIL: 0.04 IU/mL
QuantiFERON-TB Gold Plus: NEGATIVE
TB1-NIL: <0 IU/mL
TB2-NIL: 0 IU/mL

## 2019-04-29 ENCOUNTER — Telehealth: Payer: Self-pay | Admitting: Rheumatology

## 2019-04-29 ENCOUNTER — Other Ambulatory Visit: Payer: Self-pay | Admitting: Rheumatology

## 2019-04-29 MED ORDER — METHOTREXATE 2.5 MG PO TABS: 17.5000 mg | ORAL_TABLET | ORAL | 0 refills | Status: DC

## 2019-04-29 NOTE — Telephone Encounter (Signed)
Patient called requesting prescription refill of Methotrexate in pill form rather than injection.  Patient states she can't tell any difference between the injections and the pills and doesn't like giving herself the injections.

## 2019-04-29 NOTE — Telephone Encounter (Signed)
Last Visit: 12/30/18 Next Visit: 06/01/19 Labs: 04/05/19 WNL  Okay to refill to switch to tablets per Hazel Sams, PA-C

## 2019-04-29 NOTE — Telephone Encounter (Signed)
Last visit: 12/30/18 Next visit: 06/01/19  Okay to refill per Dr. Estanislado Pandy

## 2019-05-05 ENCOUNTER — Other Ambulatory Visit: Payer: Self-pay | Admitting: Rheumatology

## 2019-05-05 NOTE — Telephone Encounter (Signed)
Last Visit: 12/30/18  Next Visit: 06/01/19 Labs: 04/05/19 WNL TB Gold: 04/05/19 Neg   Okay to refill per Dr. Estanislado Pandy

## 2019-05-09 ENCOUNTER — Other Ambulatory Visit: Payer: Self-pay | Admitting: Obstetrics & Gynecology

## 2019-05-09 DIAGNOSIS — Z1231 Encounter for screening mammogram for malignant neoplasm of breast: Secondary | ICD-10-CM

## 2019-05-20 ENCOUNTER — Ambulatory Visit: Payer: No Typology Code available for payment source | Attending: Internal Medicine

## 2019-05-20 ENCOUNTER — Ambulatory Visit: Payer: Self-pay

## 2019-05-20 ENCOUNTER — Other Ambulatory Visit: Payer: No Typology Code available for payment source

## 2019-05-20 ENCOUNTER — Other Ambulatory Visit: Payer: Self-pay

## 2019-05-20 DIAGNOSIS — Z20822 Contact with and (suspected) exposure to covid-19: Secondary | ICD-10-CM

## 2019-05-20 NOTE — Telephone Encounter (Signed)
Patient called stating that she has appointment today to be tested for COVID-19 and her 1st immunization scheduled tomorrow.  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  Last night she woke nauseous and vomiting. She states it was bad.  She denies Hx of this vomiting.  She states she has no other symptoms and today she is eating and drinking. Per protocol patient will contact her PCP today for advice as to safety of the immunization with her brief symptoms.  She was advised to keep her appointment until advised by her PCP. She verbalized understanding and will call back today if PCP advises her to cancel her 1st immunization to cancel tomorrows appointment.  Reason for Disposition . [1] COVID-19 infection suspected by caller or triager AND [2] mild symptoms (cough, fever, or others) AND [id number] no complications or SOB  Answer Assessment - Initial Assessment Questions 1. COVID-19 DIAGNOSIS: "Who made your Coronavirus (COVID-19) diagnosis?" "Was it confirmed by a positive lab test?" If not diagnosed by a HCP, ask "Are there lots of cases (community spread) where you live?" (See public health department website, if unsure)     [place] 2. COVID-19 EXPOSURE: "Was there any known exposure to COVID before the symptoms began?" CDC Definition of close contact: within 6 feet (2 meters) for a total of 15 minutes or more over a 24-hour period.      no 3. ONSET: "When did the COVID-19 symptoms start?"      Last night 4. WORST SYMPTOM: "What is your worst symptom?" (e.g., cough, fever, shortness of breath, muscle aches)     vomiting 5. COUGH: "Do you have a cough?" If so, ask: "How bad is the cough?"       no 6. FEVER: "Do you have a fever?" If so, ask: "What is your temperature, how was it measured, and when did it start?"     No 7. RESPIRATORY STATUS: "Describe your breathing?" (e.g., shortness of breath, wheezing, unable to speak)     normal 8. BETTER-SAME-WORSE:  "Are you getting better, staying the same or getting worse compared to yesterday?"  If getting worse, ask, "In what way?"    better 9. HIGH RISK DISEASE: "Do you have any chronic medical problems?" (e.g., asthma, heart or lung disease, weak immune system, obesity, etc.)     no 10. PREGNANCY: "Is there any chance you are pregnant?" "When was your last menstrual period?"      no 11. OTHER SYMPTOMS: "Do you have any other symptoms?"  (e.g., chills, fatigue, headache, loss of smell or taste, muscle pain, sore throat; new loss of smell or taste especially support the diagnosis of COVID-19)     none  Protocols used: CORONAVIRUS (COVID-19) DIAGNOSED OR SUSPECTED-A-AH

## 2019-05-21 ENCOUNTER — Ambulatory Visit: Payer: No Typology Code available for payment source

## 2019-05-21 LAB — NOVEL CORONAVIRUS, NAA: SARS-CoV-2, NAA: NOT DETECTED

## 2019-05-23 ENCOUNTER — Telehealth: Payer: Self-pay | Admitting: *Deleted

## 2019-05-23 NOTE — Telephone Encounter (Signed)
Pt called in requesting COVID-19 test result.   I let her know it was not detected meaning she did not have the virus.  She thanked me for my help.

## 2019-05-27 NOTE — Progress Notes (Signed)
Office Visit Note  Patient: [name] [name]             Date of Birth: 04-30-1960           MRN: [id number]             PCP: Kelton Pillar, MD Referring: Kelton Pillar, MD Visit Date: 06/01/2019 Occupation: @GUAROCC @  Subjective:  Medication monitoring   History of Present Illness: [name] [name] is a 59 y.o. female with a past medical history of rheumatoid arthritis and degenerative disc disease. She is currently tolerating Methotrexate 0.7 mL subq once weekly, folic acid 2 mg PO daily, and Orencia subq weekly injections well. She denies any side effects from these medications. She states that her left knee continues to "pop" with bending. Since her last visit, she has seen Dr. Dorothyann Peng, her orthopedic surgeon, who states that she would need to be referred to another doctor for a knee replacement revision if she decides to proceed. She denies any pain at this time in her left knee. She also denies any other joint pain or joint swelling. She denies any recent rheumatoid arthritis flares. She has started exercising.    Activities of Daily Living:  Patient reports morning stiffness for 0 minutes.   Patient Reports nocturnal pain.  Difficulty dressing/grooming: Denies Difficulty climbing stairs: Reports Difficulty getting out of chair: Denies Difficulty using hands for taps, buttons, cutlery, and/or writing: Denies  Review of Systems  Constitutional: Negative for fatigue.  HENT: Negative for mouth sores, mouth dryness and nose dryness.   Eyes: Negative for itching and dryness.  Respiratory: Negative for shortness of breath and difficulty breathing.   Cardiovascular: Negative for chest pain and palpitations.  Gastrointestinal: Negative for blood in stool, constipation and diarrhea.  Endocrine: Negative for increased urination.  Genitourinary: Negative for difficulty urinating and painful urination.  Musculoskeletal: Positive for arthralgias and joint pain. Negative for joint  swelling and morning stiffness.  Skin: Negative for rash.  Allergic/Immunologic: Negative for susceptible to infections.  Neurological: Negative for dizziness, headaches, memory loss and weakness.  Hematological: Negative for bruising/bleeding tendency.  Psychiatric/Behavioral: Negative for confusion.    PMFS History:  Patient Active Problem List   Diagnosis Date Noted  . Status post total left knee replacement 02/28/2019  . Chronic pain of left knee 02/28/2019  . Status post total knee replacement, bilateral 12/01/2016  . Status post bilateral foot surgery 12/01/2016  . Contracture of right elbow 06/24/2016  . Contracture of left elbow 06/24/2016  . Rheumatoid arthritis involving multiple sites with positive rheumatoid factor ([place]) 05/27/2016  . ANA positive 05/27/2016  . Lateral epicondylitis, left elbow 05/27/2016  . Trochanteric bursitis of left hip 05/27/2016  . High risk medication use 05/27/2016  . DJD (degenerative joint disease), cervical 05/27/2016  . Osteopenia of neck of left femur 05/27/2016  . Primary osteoarthritis of both feet 05/27/2016  . S/P TAH-BSO (5/30) 09/04/2012    Past Medical History:  Diagnosis Date  . Arthritis    rheumotroid  . Complication of anesthesia    "irregular heartbeat" during neck surgery  . Fibroid   . Rheumatoid arteritis ([place])    age 69  . Urinary incontinence     Family History  Problem Relation Age of Onset  . Ovarian cancer Mother 68  . Lung cancer Father        asbestos  . Cancer Brother 74       appendiceal  . Seizures Brother   . Heart Problems Brother   .  Colon cancer Maternal Aunt   . Breast cancer Paternal Aunt 39  . Colon cancer Maternal Grandmother 109  . Stroke Paternal Grandmother   . Heart disease Paternal Grandmother   . Breast cancer Paternal Aunt 72   Past Surgical History:  Procedure Laterality Date  . ABDOMINAL HYSTERECTOMY N/A 09/03/2012   Procedure: HYSTERECTOMY ABDOMINAL;  Surgeon: Marvene Staff, MD;  Location: [facility];  Service: Gynecology;  Laterality: N/A;  . FOOT SURGERY Bilateral    joint implants, each foot x 2  . JOINT REPLACEMENT    . KNEE SURGERY Right 2006   fracture knee after replacement, repaired  . POSTERIOR FUSION CERVICAL SPINE  2006  . REPLACEMENT TOTAL KNEE Bilateral 1989   left 1989; right 1996  . SALPINGOOPHORECTOMY Bilateral 09/03/2012   Procedure: SALPINGO OOPHORECTOMY;  Surgeon: Marvene Staff, MD;  Location: [facility];  Service: Gynecology;  Laterality: Bilateral;  . TONSILLECTOMY  1978   Social History   Social History Narrative  . Not on file   Immunization History  Administered Date(s) Administered  . Influenza,inj,Quad PF,6+ Mos 01/22/2018     Objective: Vital Signs: BP 119/76 (BP Location: Left Arm, Patient Position: Sitting, Cuff Size: Normal)   Pulse 71   Resp 14   Ht 5\' 2"  (1.575 m)   Wt 175 lb (79.4 kg) Comment: per patient, she refused to weigh  LMP 04/07/2012   BMI 32.01 kg/m    Physical Exam Vitals and nursing note reviewed.  Constitutional:      Appearance: Normal appearance.  HENT:     Head: Normocephalic and atraumatic.  Eyes:     Conjunctiva/sclera: Conjunctivae normal.  Cardiovascular:     Rate and Rhythm: Normal rate.  Pulmonary:     Effort: Pulmonary effort is normal.  Abdominal:     Palpations: Abdomen is soft.  Musculoskeletal:        General: No swelling or tenderness.     Cervical back: Neck supple.     Right lower leg: No edema.     Left lower leg: No edema.  Skin:    General: Skin is warm and dry.  Neurological:     Mental Status: She is alert and oriented to person, place, and time.  Psychiatric:        Behavior: Behavior normal.     Musculoskeletal Exam: Limited ROM of cervical spine.  Shoulder joints elbow joints were not limited range of motion.  She has contractures in the bilateral elbow joints.  She has ulnar deviation in all of her MCPs with subluxation and thickening of MCPs and PIP  joints.  No active synovitis was noted.  She has limited range of motion of her hip joints.  Her both knee joints are replaced.  Ankle joints have limited range of motion.  CDAI Exam: CDAI Score: 0.8  Patient Global: 4 mm; Provider Global: 4 mm Swollen: 0 ; Tender: 0  Joint Exam 06/01/2019   No joint exam has been documented for this visit   There is currently no information documented on the homunculus. Go to the Rheumatology activity and complete the homunculus joint exam.  Investigation: No additional findings.  Imaging: No results found.  Recent Labs: Lab Results  Component Value Date   WBC 7.5 04/05/2019   HGB 14.1 04/05/2019   PLT 305 04/05/2019   NA 140 04/05/2019   K 4.6 04/05/2019   CL 102 04/05/2019   CO2 28 04/05/2019   GLUCOSE 78 04/05/2019   BUN 10  04/05/2019   CREATININE 0.77 04/05/2019   BILITOT 0.5 04/05/2019   ALKPHOS 83 10/18/2016   AST 15 04/05/2019   ALT 9 04/05/2019   PROT 7.3 04/05/2019   ALBUMIN 4.0 10/18/2016   CALCIUM 9.7 04/05/2019   GFRAA 100 04/05/2019   QFTBGOLD NEGATIVE 02/06/2017   QFTBGOLDPLUS NEGATIVE 04/05/2019    Speciality Comments: No specialty comments available.  Procedures:  No procedures performed Allergies: Sulfa antibiotics, Morphine and related, and Penicillins   Assessment / Plan:     Visit Diagnoses: Rheumatoid arthritis involving multiple sites with positive rheumatoid factor (HCC) - RF+, CCP+, ANA+, erosive disease.  Patient has severe disease.  She has been tolerating medications well.  She does not have any active swelling.  She has been having some discomfort in her left knee joint.  She is followed by orthopedics.  High risk medication use - Orencia 125 mg subcutaneous injections weekly, methotrexate 0.7 mL weekly, and folic acid 2 mg by mouth daily.  Her last labs were in December which were within normal limits.  Her next labs will be in March.  TB Gold was in December which was negative.  Contracture of right  and left elbow-she has bilateral elbow joint contractures.  Status post total knee replacement, bilateral-she continues to have some discomfort in her left knee joint.  She has been followed by orthopedic surgeon.  DDD (degenerative disc disease), cervical-she has limited range of motion of cervical spine.  Osteopenia of multiple sites - Plan: DG BONE DENSITY (DXA)  Class 1 obesity due to excess calories without serious comorbidity with body mass index (BMI) of 31.0 to 31.9 in adult  Orders: Orders Placed This Encounter  Procedures  . DG BONE DENSITY (DXA)   No orders of the defined types were placed in this encounter.    Follow-Up Instructions: Return in about 5 months (around 10/29/2019) for Rheumatoid arthritis.   Bo Merino, MD  Note - This record has been created using Editor, commissioning.  Chart creation errors have been sought, but may not always  have been located. Such creation errors do not reflect on  the standard of medical care.

## 2019-06-01 ENCOUNTER — Other Ambulatory Visit: Payer: Self-pay

## 2019-06-01 ENCOUNTER — Encounter: Payer: Self-pay | Admitting: Rheumatology

## 2019-06-01 ENCOUNTER — Ambulatory Visit (INDEPENDENT_AMBULATORY_CARE_PROVIDER_SITE_OTHER): Payer: No Typology Code available for payment source | Admitting: Rheumatology

## 2019-06-01 VITALS — BP 119/76 | HR 71 | Resp 14 | Ht 62.0 in | Wt 175.0 lb

## 2019-06-01 DIAGNOSIS — M24522 Contracture, left elbow: Secondary | ICD-10-CM | POA: Diagnosis not present

## 2019-06-01 DIAGNOSIS — M24521 Contracture, right elbow: Secondary | ICD-10-CM | POA: Diagnosis not present

## 2019-06-01 DIAGNOSIS — E6609 Other obesity due to excess calories: Secondary | ICD-10-CM

## 2019-06-01 DIAGNOSIS — Z79899 Other long term (current) drug therapy: Secondary | ICD-10-CM | POA: Diagnosis not present

## 2019-06-01 DIAGNOSIS — M0579 Rheumatoid arthritis with rheumatoid factor of multiple sites without organ or systems involvement: Secondary | ICD-10-CM | POA: Diagnosis not present

## 2019-06-01 DIAGNOSIS — Z96653 Presence of artificial knee joint, bilateral: Secondary | ICD-10-CM

## 2019-06-01 DIAGNOSIS — M8589 Other specified disorders of bone density and structure, multiple sites: Secondary | ICD-10-CM

## 2019-06-01 DIAGNOSIS — M5030 Other cervical disc degeneration, unspecified cervical region: Secondary | ICD-10-CM

## 2019-06-01 DIAGNOSIS — Z6831 Body mass index (BMI) 31.0-31.9, adult: Secondary | ICD-10-CM

## 2019-06-01 NOTE — Patient Instructions (Signed)
Standing Labs We placed an order today for your standing lab work.    Please come back and get your standing labs in March and every 3 months  We have open lab daily Monday through Thursday from 8:30-12:30 PM and 1:30-4:30 PM and Friday from 8:30-12:30 PM and 1:30-4:00 PM at the office of Dr. Bo Merino.   You may experience shorter wait times on Monday and Friday afternoons. The office is located at [address] [place], [place], [place] [address] No appointment is necessary.   Labs are drawn by [facility].  You may receive a bill from [facility] for your lab work.  If you wish to have your labs drawn at another location, please call the office 24 hours in advance to send orders.  If you have any questions regarding directions or hours of operation,  please call [phone].   Just as a reminder please drink plenty of water prior to coming for your lab work. Thanks!

## 2019-06-17 ENCOUNTER — Ambulatory Visit
Admission: RE | Admit: 2019-06-17 | Discharge: 2019-06-17 | Disposition: A | Discharge status: Home / Self Care | Payer: No Typology Code available for payment source | Source: Ambulatory Visit | Attending: Obstetrics & Gynecology | Admitting: Obstetrics & Gynecology

## 2019-06-17 ENCOUNTER — Other Ambulatory Visit: Payer: Self-pay

## 2019-06-17 DIAGNOSIS — Z1231 Encounter for screening mammogram for malignant neoplasm of breast: Secondary | ICD-10-CM

## 2019-06-18 ENCOUNTER — Ambulatory Visit: Payer: No Typology Code available for payment source | Attending: Internal Medicine

## 2019-06-18 DIAGNOSIS — Z23 Encounter for immunization: Secondary | ICD-10-CM

## 2019-06-18 NOTE — Progress Notes (Signed)
   Covid-19 Vaccination Clinic  Name:  [name] [name]    MRN: [id number] DOB: 1961/04/03  06/18/2019  [name] was observed post Covid-19 immunization for 30 minutes based on pre-vaccination screening. She complained of feeling like her heart was racing approximately 15 minutes into evaluation.  VS 125/58 P 72 O2 97%.  Stated she is on medication and sometimes feels her heart race.  Monitored for additional time without further symptoms.  Ambulatory at discharge without difficulty  She was provided with Vaccine Information Sheet and instruction to access the V-Safe system.   [name] was instructed to call 911 with any severe reactions post vaccine: Marland Kitchen Difficulty breathing  . Swelling of face and throat  . A fast heartbeat  . A bad rash all over body  . Dizziness and weakness   Immunizations Administered    Name Date Dose VIS Date Route   Moderna COVID-19 Vaccine 06/18/2019 10:28 AM 0.5 mL 03/08/2019 Intramuscular   Manufacturer: Moderna   Lot: [id number]   [id number]

## 2019-07-11 ENCOUNTER — Telehealth: Payer: Self-pay | Admitting: Rheumatology

## 2019-07-11 DIAGNOSIS — Z79899 Other long term (current) drug therapy: Secondary | ICD-10-CM

## 2019-07-11 NOTE — Telephone Encounter (Signed)
Patient left a voicemail requesting labwork orders be sent to [facility].  Patient states she will be going tomorrow morning, 07/12/19.

## 2019-07-11 NOTE — Telephone Encounter (Signed)
Lab Order released.  

## 2019-07-12 LAB — CBC WITH DIFFERENTIAL/PLATELET
Absolute Monocytes: 341 cells/uL (ref 200–950)
Basophils Absolute: 92 cells/uL (ref 0–200)
Basophils Relative: 1.3 %
Eosinophils Absolute: 213 cells/uL (ref 15–500)
Eosinophils Relative: 3 %
HCT: 41.2 % (ref 35.0–45.0)
Hemoglobin: 13.6 g/dL (ref 11.7–15.5)
Lymphs Abs: 2883 cells/uL (ref 850–3900)
MCH: 28 pg (ref 27.0–33.0)
MCHC: 33 g/dL (ref 32.0–36.0)
MCV: 84.8 fL (ref 80.0–100.0)
MPV: 11.7 fL (ref 7.5–12.5)
Monocytes Relative: 4.8 %
Neutro Abs: 3571 cells/uL (ref 1500–7800)
Neutrophils Relative %: 50.3 %
Platelets: 333 10*3/uL (ref 140–400)
RBC: 4.86 10*6/uL (ref 3.80–5.10)
RDW: 14.2 % (ref 11.0–15.0)
Total Lymphocyte: 40.6 %
WBC: 7.1 10*3/uL (ref 3.8–10.8)

## 2019-07-12 LAB — COMPLETE METABOLIC PANEL WITH GFR
AG Ratio: 1.3 (calc) (ref 1.0–2.5)
ALT: 13 U/L (ref 6–29)
AST: 18 U/L (ref 10–35)
Albumin: 3.9 g/dL (ref 3.6–5.1)
Alkaline phosphatase (APISO): 96 U/L (ref 37–153)
BUN: 12 mg/dL (ref 7–25)
CO2: 29 mmol/L (ref 20–32)
Calcium: 9.5 mg/dL (ref 8.6–10.4)
Chloride: 104 mmol/L (ref 98–110)
Creat: 0.73 mg/dL (ref 0.50–1.05)
GFR, Est African American: 107 mL/min/{1.73_m2} (ref >=60)
GFR, Est Non African American: 92 mL/min/{1.73_m2} (ref >=60)
Globulin: 3 g/dL (calc) (ref 1.9–3.7)
Glucose, Bld: 93 mg/dL (ref 65–99)
Potassium: 4.3 mmol/L (ref 3.5–5.3)
Sodium: 139 mmol/L (ref 135–146)
Total Bilirubin: 0.7 mg/dL (ref 0.2–1.2)
Total Protein: 6.9 g/dL (ref 6.1–8.1)

## 2019-07-12 NOTE — Telephone Encounter (Signed)
CMP is normal.

## 2019-07-12 NOTE — Telephone Encounter (Signed)
CBC is normal.

## 2019-07-13 ENCOUNTER — Other Ambulatory Visit: Payer: Self-pay | Admitting: *Deleted

## 2019-07-13 MED ORDER — METHOTREXATE 2.5 MG PO TABS: 17.5000 mg | ORAL_TABLET | ORAL | 0 refills | Status: DC

## 2019-07-13 NOTE — Telephone Encounter (Signed)
Last Visit: 06/01/19 Next Visit: 10/26/19 Labs: 07/12/19 WNL  Current Dose on 06/01/19 methotrexate 0.7 mL weekly, ( Switched to tablets per Hazel Sams, PA-C on 04/29/19)   Okay to refill per Dr. Estanislado Pandy

## 2019-07-15 ENCOUNTER — Encounter: Payer: Self-pay | Admitting: Rheumatology

## 2019-07-19 ENCOUNTER — Telehealth: Payer: Self-pay

## 2019-07-19 NOTE — Telephone Encounter (Signed)
Bone density  Date of scan: 07/15/2019  Lowest site measured: left femoral neck T-Score: -1.8 BMD: 0.691   DEXA reviewed by Hazel Sams, PA-C. Recommend to continue taking calcium, vitamin D, performing resistive exercises. Repeat DEXA in 2 years.   Advised patient of results and recommendations, patient verbalized understanding. Document sent to the scan center

## 2019-07-20 ENCOUNTER — Ambulatory Visit: Payer: No Typology Code available for payment source | Attending: Internal Medicine

## 2019-07-20 DIAGNOSIS — Z23 Encounter for immunization: Secondary | ICD-10-CM

## 2019-07-20 NOTE — Progress Notes (Signed)
   Covid-19 Vaccination Clinic  Name:  Caliya Corrine Jahleah Devany    MRN: [id number] DOB: 1961-01-11  07/20/2019  Ms. Alicianna Connelley was observed post Covid-19 immunization for 30 minutes based on pre-vaccination screening without incident. She was provided with Vaccine Information Sheet and instruction to access the V-Safe system.   Ms. Matrice Stansberry was instructed to call 911 with any severe reactions post vaccine: Marland Kitchen Difficulty breathing  . Swelling of face and throat  . A fast heartbeat  . A bad rash all over body  . Dizziness and weakness   Immunizations Administered    Name Date Dose VIS Date Route   Moderna COVID-19 Vaccine 07/20/2019  8:41 AM 0.5 mL 03/08/2019 Intramuscular   Manufacturer: Moderna   Lot: [id number]   [id number]

## 2019-07-21 ENCOUNTER — Telehealth: Payer: Self-pay | Admitting: Rheumatology

## 2019-07-21 NOTE — Telephone Encounter (Signed)
Her bone density is in the osteopenia range.  At this point she does not need any treatment except for calcium, vitamin D and resistive exercises.

## 2019-07-21 NOTE — Telephone Encounter (Signed)
Patient left a voicemail stating she would like to "speak with the nurse directly" and requested a return call.

## 2019-07-21 NOTE — Telephone Encounter (Signed)
Advised patient Her bone density is in the osteopenia range.  At this point she does not need any treatment except for calcium, vitamin D and resistive exercises. Patient verbalized understanding.   Patient also requested a copy of her TB gold to be faxed to [facility]. I advised patient we would need a signed medical release form and patient has opted to pick up the lab result herself. It is at the front desk for pick up.

## 2019-07-21 NOTE — Telephone Encounter (Signed)
Patient states she was on fosamax years ago and took it for several years. Patient would like to know if she can re-start fosamax. Please advise.   Most recent bone density:   Date of scan: 07/15/2019 Lowest site measured: left femoral neck T-Score: -1.8 BMD: 0.691

## 2019-07-21 NOTE — Telephone Encounter (Signed)
I have spoken with patient about this and the result has been printed and is at the front desk to be picked up. Patient will pick up lab result.

## 2019-07-21 NOTE — Telephone Encounter (Signed)
Patient left a voicemail requesting her TB gold results be faxed to [facility] at [phone] Attn:  Adonis Huguenin

## 2019-08-01 ENCOUNTER — Other Ambulatory Visit: Payer: Self-pay | Admitting: Rheumatology

## 2019-08-01 NOTE — Telephone Encounter (Signed)
Last Visit: 06/01/2019 Next Visit: 10/26/2019  Okay to refill per Dr. Estanislado Pandy.

## 2019-09-22 ENCOUNTER — Other Ambulatory Visit: Payer: Self-pay | Admitting: *Deleted

## 2019-09-22 MED ORDER — METHOTREXATE 2.5 MG PO TABS: 17.5000 mg | ORAL_TABLET | ORAL | 0 refills | Status: DC

## 2019-09-22 NOTE — Telephone Encounter (Signed)
Refill request received via fax  Last Visit: 06/01/2019 Next Visit: 10/26/2019 Labs: 07/12/2019 WNL  Current Dose per office note 06/01/2019: methotrexate 0.7 mL weekly.  Prescription refill is for MTX tablets and last refill was for MTX tablets. Per phone call note on 04/29/2019. Patient requested change.  Okay to refill MTX?

## 2019-10-12 NOTE — Progress Notes (Deleted)
Office Visit Note  Patient: [name] [name]             Date of Birth: 07/13/1960           MRN: [id number]             PCP: Kelton Pillar, MD Referring: Kelton Pillar, MD Visit Date: 10/26/2019 Occupation: @GUAROCC @  Subjective:  No chief complaint on file.   History of Present Illness: [name] [name] is a 59 y.o. female ***   Activities of Daily Living:  Patient reports morning stiffness for *** {minute/hour:19697}.   Patient {ACTIONS;DENIES/REPORTS:21021675::"Denies"} nocturnal pain.  Difficulty dressing/grooming: {ACTIONS;DENIES/REPORTS:21021675::"Denies"} Difficulty climbing stairs: {ACTIONS;DENIES/REPORTS:21021675::"Denies"} Difficulty getting out of chair: {ACTIONS;DENIES/REPORTS:21021675::"Denies"} Difficulty using hands for taps, buttons, cutlery, and/or writing: {ACTIONS;DENIES/REPORTS:21021675::"Denies"}  No Rheumatology ROS completed.   PMFS History:  Patient Active Problem List   Diagnosis Date Noted  . Status post total left knee replacement 02/28/2019  . Chronic pain of left knee 02/28/2019  . Status post total knee replacement, bilateral 12/01/2016  . Status post bilateral foot surgery 12/01/2016  . Contracture of right elbow 06/24/2016  . Contracture of left elbow 06/24/2016  . Rheumatoid arthritis involving multiple sites with positive rheumatoid factor ([place]) 05/27/2016  . ANA positive 05/27/2016  . Lateral epicondylitis, left elbow 05/27/2016  . Trochanteric bursitis of left hip 05/27/2016  . High risk medication use 05/27/2016  . DJD (degenerative joint disease), cervical 05/27/2016  . Osteopenia of neck of left femur 05/27/2016  . Primary osteoarthritis of both feet 05/27/2016  . S/P TAH-BSO (5/30) 09/04/2012    Past Medical History:  Diagnosis Date  . Arthritis    rheumotroid  . Complication of anesthesia    "irregular heartbeat" during neck surgery  . Fibroid   . Rheumatoid arteritis ([place])    age 54  .  Urinary incontinence     Family History  Problem Relation Age of Onset  . Ovarian cancer Mother 17  . Lung cancer Father        asbestos  . Cancer Brother 46       appendiceal  . Seizures Brother   . Heart Problems Brother   . Colon cancer Maternal Aunt   . Breast cancer Paternal Aunt 24  . Colon cancer Maternal Grandmother 53  . Stroke Paternal Grandmother   . Heart disease Paternal Grandmother   . Breast cancer Paternal Aunt 19   Past Surgical History:  Procedure Laterality Date  . ABDOMINAL HYSTERECTOMY N/A 09/03/2012   Procedure: HYSTERECTOMY ABDOMINAL;  Surgeon: Marvene Staff, MD;  Location: [facility];  Service: Gynecology;  Laterality: N/A;  . FOOT SURGERY Bilateral    joint implants, each foot x 2  . JOINT REPLACEMENT    . KNEE SURGERY Right 2006   fracture knee after replacement, repaired  . POSTERIOR FUSION CERVICAL SPINE  2006  . REPLACEMENT TOTAL KNEE Bilateral 1989   left 1989; right 1996  . SALPINGOOPHORECTOMY Bilateral 09/03/2012   Procedure: SALPINGO OOPHORECTOMY;  Surgeon: Marvene Staff, MD;  Location: [facility];  Service: Gynecology;  Laterality: Bilateral;  . TONSILLECTOMY  1978   Social History   Social History Narrative  . Not on file   Immunization History  Administered Date(s) Administered  . Influenza,inj,Quad PF,6+ Mos 01/22/2018, 02/02/2019  . Moderna SARS-COVID-2 Vaccination 06/18/2019, 07/20/2019     Objective: Vital Signs: LMP 04/07/2012    Physical Exam   Musculoskeletal Exam: ***  CDAI Exam: CDAI Score: -- Patient Global: --; Provider Global: --  Swollen: --; Tender: -- Joint Exam 10/26/2019   No joint exam has been documented for this visit   There is currently no information documented on the homunculus. Go to the Rheumatology activity and complete the homunculus joint exam.  Investigation: No additional findings.  Imaging: No results found.  Recent Labs: Lab Results  Component Value Date   WBC 7.1 07/12/2019    HGB 13.6 07/12/2019   PLT 333 07/12/2019   NA 139 07/12/2019   K 4.3 07/12/2019   CL 104 07/12/2019   CO2 29 07/12/2019   GLUCOSE 93 07/12/2019   BUN 12 07/12/2019   CREATININE 0.73 07/12/2019   BILITOT 0.7 07/12/2019   ALKPHOS 83 10/18/2016   AST 18 07/12/2019   ALT 13 07/12/2019   PROT 6.9 07/12/2019   ALBUMIN 4.0 10/18/2016   CALCIUM 9.5 07/12/2019   GFRAA 107 07/12/2019   QFTBGOLD NEGATIVE 02/06/2017   QFTBGOLDPLUS NEGATIVE 04/05/2019    Speciality Comments: No specialty comments available.  Procedures:  No procedures performed Allergies: Sulfa antibiotics, Morphine and related, and Penicillins   Assessment / Plan:     Visit Diagnoses: No diagnosis found.  Orders: No orders of the defined types were placed in this encounter.  No orders of the defined types were placed in this encounter.   Face-to-face time spent with patient was *** minutes. Greater than 50% of time was spent in counseling and coordination of care.  Follow-Up Instructions: No follow-ups on file.   Ofilia Neas, PA-C  Note - This record has been created using Dragon software.  Chart creation errors have been sought, but may not always  have been located. Such creation errors do not reflect on  the standard of medical care.

## 2019-10-13 ENCOUNTER — Telehealth: Payer: Self-pay | Admitting: Rheumatology

## 2019-10-13 DIAGNOSIS — Z79899 Other long term (current) drug therapy: Secondary | ICD-10-CM

## 2019-10-13 NOTE — Telephone Encounter (Signed)
Patient at [facility] in [place] now. Please release orders.

## 2019-10-13 NOTE — Telephone Encounter (Signed)
Lab Orders released.  

## 2019-10-14 LAB — CBC WITH DIFFERENTIAL/PLATELET
Absolute Monocytes: 418 cells/uL (ref 200–950)
Basophils Absolute: 82 cells/uL (ref 0–200)
Basophils Relative: 1 %
Eosinophils Absolute: 197 cells/uL (ref 15–500)
Eosinophils Relative: 2.4 %
HCT: 42.3 % (ref 35.0–45.0)
Hemoglobin: 13.8 g/dL (ref 11.7–15.5)
Lymphs Abs: 3321 cells/uL (ref 850–3900)
MCH: 27.7 pg (ref 27.0–33.0)
MCHC: 32.6 g/dL (ref 32.0–36.0)
MCV: 84.8 fL (ref 80.0–100.0)
MPV: 11.7 fL (ref 7.5–12.5)
Monocytes Relative: 5.1 %
Neutro Abs: 4182 cells/uL (ref 1500–7800)
Neutrophils Relative %: 51 %
Platelets: 299 10*3/uL (ref 140–400)
RBC: 4.99 10*6/uL (ref 3.80–5.10)
RDW: 14.4 % (ref 11.0–15.0)
Total Lymphocyte: 40.5 %
WBC: 8.2 10*3/uL (ref 3.8–10.8)

## 2019-10-14 LAB — COMPLETE METABOLIC PANEL WITH GFR
AG Ratio: 1.5 (calc) (ref 1.0–2.5)
ALT: 16 U/L (ref 6–29)
AST: 22 U/L (ref 10–35)
Albumin: 4.3 g/dL (ref 3.6–5.1)
Alkaline phosphatase (APISO): 86 U/L (ref 37–153)
BUN: 12 mg/dL (ref 7–25)
CO2: 28 mmol/L (ref 20–32)
Calcium: 9.9 mg/dL (ref 8.6–10.4)
Chloride: 102 mmol/L (ref 98–110)
Creat: 0.71 mg/dL (ref 0.50–1.05)
GFR, Est African American: 110 mL/min/{1.73_m2} (ref >=60)
GFR, Est Non African American: 95 mL/min/{1.73_m2} (ref >=60)
Globulin: 2.8 g/dL (calc) (ref 1.9–3.7)
Glucose, Bld: 83 mg/dL (ref 65–139)
Potassium: 4.3 mmol/L (ref 3.5–5.3)
Sodium: 139 mmol/L (ref 135–146)
Total Bilirubin: 0.4 mg/dL (ref 0.2–1.2)
Total Protein: 7.1 g/dL (ref 6.1–8.1)

## 2019-10-14 NOTE — Telephone Encounter (Signed)
CBC and CMP WNL

## 2019-10-18 ENCOUNTER — Other Ambulatory Visit: Payer: Self-pay | Admitting: Rheumatology

## 2019-10-18 NOTE — Telephone Encounter (Signed)
Last Visit: 06/01/2019 Next Visit: 10/26/2019 Labs: 7/8/2021WNL TB Gold: 04/05/2019 Neg   Current Dose per office note on 06/01/2019: Orencia 125 mg subcutaneous injections weekly  Okay to refill per Dr. Estanislado Pandy

## 2019-10-26 ENCOUNTER — Ambulatory Visit: Payer: No Typology Code available for payment source | Admitting: Physician Assistant

## 2019-11-01 ENCOUNTER — Ambulatory Visit: Payer: No Typology Code available for payment source | Admitting: Physician Assistant

## 2019-11-02 NOTE — Progress Notes (Deleted)
Office Visit Note  Patient: [name] [name]             Date of Birth: 03-Mar-1961           MRN: [id number]             PCP: Kelton Pillar, MD Referring: Kelton Pillar, MD Visit Date: 11/16/2019 Occupation: @GUAROCC @  Subjective:  No chief complaint on file.   History of Present Illness: [name] [name] is a 59 y.o. female ***   Activities of Daily Living:  Patient reports morning stiffness for *** {minute/hour:19697}.   Patient {ACTIONS;DENIES/REPORTS:21021675::"Denies"} nocturnal pain.  Difficulty dressing/grooming: {ACTIONS;DENIES/REPORTS:21021675::"Denies"} Difficulty climbing stairs: {ACTIONS;DENIES/REPORTS:21021675::"Denies"} Difficulty getting out of chair: {ACTIONS;DENIES/REPORTS:21021675::"Denies"} Difficulty using hands for taps, buttons, cutlery, and/or writing: {ACTIONS;DENIES/REPORTS:21021675::"Denies"}  No Rheumatology ROS completed.   PMFS History:  Patient Active Problem List   Diagnosis Date Noted  . Status post total left knee replacement 02/28/2019  . Chronic pain of left knee 02/28/2019  . Status post total knee replacement, bilateral 12/01/2016  . Status post bilateral foot surgery 12/01/2016  . Contracture of right elbow 06/24/2016  . Contracture of left elbow 06/24/2016  . Rheumatoid arthritis involving multiple sites with positive rheumatoid factor ([place]) 05/27/2016  . ANA positive 05/27/2016  . Lateral epicondylitis, left elbow 05/27/2016  . Trochanteric bursitis of left hip 05/27/2016  . High risk medication use 05/27/2016  . DJD (degenerative joint disease), cervical 05/27/2016  . Osteopenia of neck of left femur 05/27/2016  . Primary osteoarthritis of both feet 05/27/2016  . S/P TAH-BSO (5/30) 09/04/2012    Past Medical History:  Diagnosis Date  . Arthritis    rheumotroid  . Complication of anesthesia    "irregular heartbeat" during neck surgery  . Fibroid   . Rheumatoid arteritis ([place])    age 45  .  Urinary incontinence     Family History  Problem Relation Age of Onset  . Ovarian cancer Mother 57  . Lung cancer Father        asbestos  . Cancer Brother 14       appendiceal  . Seizures Brother   . Heart Problems Brother   . Colon cancer Maternal Aunt   . Breast cancer Paternal Aunt 64  . Colon cancer Maternal Grandmother 36  . Stroke Paternal Grandmother   . Heart disease Paternal Grandmother   . Breast cancer Paternal Aunt 64   Past Surgical History:  Procedure Laterality Date  . ABDOMINAL HYSTERECTOMY N/A 09/03/2012   Procedure: HYSTERECTOMY ABDOMINAL;  Surgeon: Marvene Staff, MD;  Location: [facility];  Service: Gynecology;  Laterality: N/A;  . FOOT SURGERY Bilateral    joint implants, each foot x 2  . JOINT REPLACEMENT    . KNEE SURGERY Right 2006   fracture knee after replacement, repaired  . POSTERIOR FUSION CERVICAL SPINE  2006  . REPLACEMENT TOTAL KNEE Bilateral 1989   left 1989; right 1996  . SALPINGOOPHORECTOMY Bilateral 09/03/2012   Procedure: SALPINGO OOPHORECTOMY;  Surgeon: Marvene Staff, MD;  Location: [facility];  Service: Gynecology;  Laterality: Bilateral;  . TONSILLECTOMY  1978   Social History   Social History Narrative  . Not on file   Immunization History  Administered Date(s) Administered  . Influenza,inj,Quad PF,6+ Mos 01/22/2018, 02/02/2019  . Moderna SARS-COVID-2 Vaccination 06/18/2019, 07/20/2019     Objective: Vital Signs: LMP 04/07/2012    Physical Exam   Musculoskeletal Exam: ***  CDAI Exam: CDAI Score: -- Patient Global: --; Provider Global: --  Swollen: --; Tender: -- Joint Exam 11/16/2019   No joint exam has been documented for this visit   There is currently no information documented on the homunculus. Go to the Rheumatology activity and complete the homunculus joint exam.  Investigation: No additional findings.  Imaging: No results found.  Recent Labs: Lab Results  Component Value Date   WBC 8.2 10/13/2019    HGB 13.8 10/13/2019   PLT 299 10/13/2019   NA 139 10/13/2019   K 4.3 10/13/2019   CL 102 10/13/2019   CO2 28 10/13/2019   GLUCOSE 83 10/13/2019   BUN 12 10/13/2019   CREATININE 0.71 10/13/2019   BILITOT 0.4 10/13/2019   ALKPHOS 83 10/18/2016   AST 22 10/13/2019   ALT 16 10/13/2019   PROT 7.1 10/13/2019   ALBUMIN 4.0 10/18/2016   CALCIUM 9.9 10/13/2019   GFRAA 110 10/13/2019   QFTBGOLD NEGATIVE 02/06/2017   QFTBGOLDPLUS NEGATIVE 04/05/2019    Speciality Comments: No specialty comments available.  Procedures:  No procedures performed Allergies: Sulfa antibiotics, Morphine and related, and Penicillins   Assessment / Plan:     Visit Diagnoses: No diagnosis found.  Orders: No orders of the defined types were placed in this encounter.  No orders of the defined types were placed in this encounter.   Face-to-face time spent with patient was *** minutes. Greater than 50% of time was spent in counseling and coordination of care.  Follow-Up Instructions: No follow-ups on file.   Earnestine Mealing, CMA  Note - This record has been created using Editor, commissioning.  Chart creation errors have been sought, but may not always  have been located. Such creation errors do not reflect on  the standard of medical care.

## 2019-11-08 ENCOUNTER — Telehealth: Payer: Self-pay | Admitting: Pharmacy Technician

## 2019-11-08 NOTE — Telephone Encounter (Signed)
Submitted a Prior Authorization request to [facility] for [facility] [facility] via Fax. Will update once we receive a response.   PA# [id number]

## 2019-11-09 ENCOUNTER — Telehealth: Payer: Self-pay | Admitting: Rheumatology

## 2019-11-09 NOTE — Telephone Encounter (Signed)
Speciality medication form received Percentage blank. Needs more inform.

## 2019-11-10 NOTE — Telephone Encounter (Signed)
Faxed missing information to [facility].   Mariella Saa, PharmD, [place], CPP Clinical Specialty Pharmacist (Rheumatology and Pulmonology)  11/10/2019 9:29 AM

## 2019-11-14 NOTE — Telephone Encounter (Signed)
Received notification from [facility] regarding a prior authorization for [facility]. Authorization has been APPROVED from 11/11/19 to 11/10/20.   PA # [id number] -Standard Option [id number] AU   Mariella Saa, PharmD, BCACP, CPP Clinical Specialty Pharmacist (Rheumatology and Pulmonology)  11/14/2019 8:48 AM

## 2019-11-15 NOTE — Progress Notes (Signed)
Office Visit Note  Patient: [name] [name]             Date of Birth: October 03, 1960           MRN: [id number]             PCP: Kelton Pillar, MD Referring: Kelton Pillar, MD Visit Date: 11/16/2019 Occupation: @GUAROCC @  Subjective:  Right knee and left ankle pain.   History of Present Illness: [name] [name] is a 59 y.o. female with history of severe end-stage rheumatoid arthritis.  She states she is continues to have some discomfort in her left knee and her right ankle.  She denies any joint swelling.  She states she has been tolerating medications well.  Activities of Daily Living:  Patient reports morning stiffness for 0 minute.   Patient Denies nocturnal pain.  Difficulty dressing/grooming: Denies Difficulty climbing stairs: Reports Difficulty getting out of chair: Denies Difficulty using hands for taps, buttons, cutlery, and/or writing: Denies  Review of Systems  Constitutional: Negative for fatigue, night sweats, weight gain and weight loss.  HENT: Positive for mouth dryness. Negative for mouth sores, trouble swallowing, trouble swallowing and nose dryness.   Eyes: Negative for pain, redness, visual disturbance and dryness.  Respiratory: Negative for cough, shortness of breath and difficulty breathing.   Cardiovascular: Negative for chest pain, palpitations, hypertension, irregular heartbeat and swelling in legs/feet.  Gastrointestinal: Negative for blood in stool, constipation and diarrhea.  Endocrine: Negative for increased urination.  Genitourinary: Negative for vaginal dryness.  Musculoskeletal: Positive for arthralgias and joint pain. Negative for joint swelling, myalgias, muscle weakness, morning stiffness, muscle tenderness and myalgias.  Skin: Negative for color change, rash, hair loss, skin tightness, ulcers and sensitivity to sunlight.  Allergic/Immunologic: Negative for susceptible to infections.  Neurological: Negative for  dizziness, memory loss, night sweats and weakness.  Hematological: Negative for swollen glands.  Psychiatric/Behavioral: Positive for depressed mood. Negative for sleep disturbance. The patient is not nervous/anxious.     PMFS History:  Patient Active Problem List   Diagnosis Date Noted  . Status post total left knee replacement 02/28/2019  . Chronic pain of left knee 02/28/2019  . Status post total knee replacement, bilateral 12/01/2016  . Status post bilateral foot surgery 12/01/2016  . Contracture of right elbow 06/24/2016  . Contracture of left elbow 06/24/2016  . Rheumatoid arthritis involving multiple sites with positive rheumatoid factor ([place]) 05/27/2016  . ANA positive 05/27/2016  . Lateral epicondylitis, left elbow 05/27/2016  . Trochanteric bursitis of left hip 05/27/2016  . High risk medication use 05/27/2016  . DJD (degenerative joint disease), cervical 05/27/2016  . Osteopenia of neck of left femur 05/27/2016  . Primary osteoarthritis of both feet 05/27/2016  . S/P TAH-BSO (5/30) 09/04/2012    Past Medical History:  Diagnosis Date  . Arthritis    rheumotroid  . Complication of anesthesia    "irregular heartbeat" during neck surgery  . Fibroid   . Rheumatoid arteritis ([place])    age 61  . Urinary incontinence     Family History  Problem Relation Age of Onset  . Ovarian cancer Mother 88  . Lung cancer Father        asbestos  . Cancer Brother 30       appendiceal  . Seizures Brother   . Heart Problems Brother   . Colon cancer Maternal Aunt   . Breast cancer Paternal Aunt 27  . Colon cancer Maternal Grandmother 65  . Stroke Paternal  Grandmother   . Heart disease Paternal Grandmother   . Breast cancer Paternal Aunt 54   Past Surgical History:  Procedure Laterality Date  . ABDOMINAL HYSTERECTOMY N/A 09/03/2012   Procedure: HYSTERECTOMY ABDOMINAL;  Surgeon: Marvene Staff, MD;  Location: [facility];  Service: Gynecology;  Laterality: N/A;  . FOOT SURGERY  Bilateral    joint implants, each foot x 2  . JOINT REPLACEMENT    . KNEE SURGERY Right 2006   fracture knee after replacement, repaired  . POSTERIOR FUSION CERVICAL SPINE  2006  . REPLACEMENT TOTAL KNEE Bilateral 1989   left 1989; right 1996  . SALPINGOOPHORECTOMY Bilateral 09/03/2012   Procedure: SALPINGO OOPHORECTOMY;  Surgeon: Marvene Staff, MD;  Location: [facility];  Service: Gynecology;  Laterality: Bilateral;  . TONSILLECTOMY  1978   Social History   Social History Narrative  . Not on file   Immunization History  Administered Date(s) Administered  . Influenza,inj,Quad PF,6+ Mos 01/22/2018, 02/02/2019  . Moderna SARS-COVID-2 Vaccination 06/18/2019, 07/20/2019     Objective: Vital Signs: BP 106/73 (BP Location: Right Arm, Patient Position: Sitting, Cuff Size: Small)   Pulse 66   Resp 12   Ht 5\' 2"  (1.575 m)   Wt 172 lb (78 kg) Comment: Pwr patient report. Patient refused to fweigh in office  LMP 04/07/2012   BMI 31.46 kg/m    Physical Exam Vitals and nursing note reviewed.  Constitutional:      Appearance: She is well-developed.  HENT:     Head: Normocephalic and atraumatic.  Eyes:     Conjunctiva/sclera: Conjunctivae normal.  Cardiovascular:     Rate and Rhythm: Normal rate and regular rhythm.     Heart sounds: Normal heart sounds.  Pulmonary:     Effort: Pulmonary effort is normal.     Breath sounds: Normal breath sounds.  Abdominal:     General: Bowel sounds are normal.     Palpations: Abdomen is soft.  Musculoskeletal:     Cervical back: Normal range of motion.  Lymphadenopathy:     Cervical: No cervical adenopathy.  Skin:    General: Skin is warm and dry.     Capillary Refill: Capillary refill takes less than 2 seconds.  Neurological:     Mental Status: She is alert and oriented to person, place, and time.  Psychiatric:        Behavior: Behavior normal.      Musculoskeletal Exam: She had very limited range of motion of her cervical spine.   Shoulder joints with good range of motion.  She had bilateral elbow joint contractures.  She had limited range of motion of in her wrist joints with synovial thickening.  Synovial thickening of MCP joints with ulnar deviation was noted.  PIP thickening was noted.  No synovitis was noted.  She had good range of motion of bilateral hip joints.  She had painful range of motion of bilateral knee joints especially her left knee.  She had limited range of motion of bilateral ankle joints with discomfort in her right ankle.  No MTP tenderness was noted.  CDAI Exam: CDAI Score: 1.4  Patient Global: 1 mm; Provider Global: 3 mm Swollen: 0 ; Tender: 2  Joint Exam 11/16/2019      Right  Left  Knee      Tender  Ankle   Tender        Investigation: No additional findings.  Imaging: No results found.  Recent Labs: Lab Results  Component Value Date  WBC 8.2 10/13/2019   HGB 13.8 10/13/2019   PLT 299 10/13/2019   NA 139 10/13/2019   K 4.3 10/13/2019   CL 102 10/13/2019   CO2 28 10/13/2019   GLUCOSE 83 10/13/2019   BUN 12 10/13/2019   CREATININE 0.71 10/13/2019   BILITOT 0.4 10/13/2019   ALKPHOS 83 10/18/2016   AST 22 10/13/2019   ALT 16 10/13/2019   PROT 7.1 10/13/2019   ALBUMIN 4.0 10/18/2016   CALCIUM 9.9 10/13/2019   GFRAA 110 10/13/2019   QFTBGOLD NEGATIVE 02/06/2017   QFTBGOLDPLUS NEGATIVE 04/05/2019    Speciality Comments: No specialty comments available.  Procedures:  No procedures performed Allergies: Sulfa antibiotics, Morphine and related, and Penicillins   Assessment / Plan:     Visit Diagnoses: Rheumatoid arthritis involving multiple sites with positive rheumatoid factor ([facility]) - RF+, CCP+, ANA+, erosive disease.  She denies any joint swelling.  She had synovial thickening but no active synovitis on examination.  She has been tolerating her medications well.  High risk medication use - Orencia 125 mg subcutaneous injections weekly, methotrexate 7 tablets by mouth once  weekly, and folic acid 2 mg by mouth daily. TB Gold: 04/05/2019 negative.  Her labs from October 13, 2019 were reviewed.  They were within normal limits.  She is supposed to get labs every 3 months to monitor for drug toxicity.  Contracture of right elbow-unchanged without any synovitis.  Contracture of left elbow-unchanged with no synovitis.  Status post total knee replacement, bilateral-she continues to have pain and discomfort in her bilateral knee joints.  Her left knee joint has been more painful recently.  She is not interested in getting another knee replacement.  She has some warmth on palpation.  DDD (degenerative disc disease), cervical -she had very limited range of motion of her cervical spine.  Range of motion exercises were discussed.  Osteopenia of multiple sites - Aug 19, 2019 DEXA scan showed T score of -1.8.  Use of calcium, vitamin D and exercise was emphasized.  Class 1 obesity due to excess calories without serious comorbidity with body mass index (BMI) of 31.0 to 31.9 in adult.  She will benefit from exercise.  She has had both COVID-19 vaccines.Use of mask, social distancing and hand hygiene was discussed.  If she develops COVID-19 infection she will be a candidate for monoclonal antibody infusion.  Please get vaccine booster when it is available.  She was advised to hold Orencia 1 week prior to and 1 week after the vaccination.  She will hold methotrexate 1 week after the vaccination.  Orders: No orders of the defined types were placed in this encounter.  No orders of the defined types were placed in this encounter.     Follow-Up Instructions: Return in about 5 months (around 04/17/2020) for Rheumatoid arthritis.   Bo Merino, MD  Note - This record has been created using Editor, commissioning.  Chart creation errors have been sought, but may not always  have been located. Such creation errors do not reflect on  the standard of medical care.

## 2019-11-16 ENCOUNTER — Other Ambulatory Visit: Payer: Self-pay

## 2019-11-16 ENCOUNTER — Ambulatory Visit (INDEPENDENT_AMBULATORY_CARE_PROVIDER_SITE_OTHER): Payer: No Typology Code available for payment source | Admitting: Rheumatology

## 2019-11-16 ENCOUNTER — Encounter: Payer: Self-pay | Admitting: Rheumatology

## 2019-11-16 ENCOUNTER — Ambulatory Visit: Payer: No Typology Code available for payment source | Admitting: Rheumatology

## 2019-11-16 VITALS — BP 106/73 | HR 66 | Resp 12 | Ht 62.0 in | Wt 172.0 lb

## 2019-11-16 DIAGNOSIS — Z6831 Body mass index (BMI) 31.0-31.9, adult: Secondary | ICD-10-CM

## 2019-11-16 DIAGNOSIS — M24522 Contracture, left elbow: Secondary | ICD-10-CM

## 2019-11-16 DIAGNOSIS — M0579 Rheumatoid arthritis with rheumatoid factor of multiple sites without organ or systems involvement: Secondary | ICD-10-CM | POA: Diagnosis not present

## 2019-11-16 DIAGNOSIS — Z79899 Other long term (current) drug therapy: Secondary | ICD-10-CM | POA: Diagnosis not present

## 2019-11-16 DIAGNOSIS — Z96653 Presence of artificial knee joint, bilateral: Secondary | ICD-10-CM

## 2019-11-16 DIAGNOSIS — E6609 Other obesity due to excess calories: Secondary | ICD-10-CM

## 2019-11-16 DIAGNOSIS — M5030 Other cervical disc degeneration, unspecified cervical region: Secondary | ICD-10-CM

## 2019-11-16 DIAGNOSIS — M24521 Contracture, right elbow: Secondary | ICD-10-CM | POA: Diagnosis not present

## 2019-11-16 DIAGNOSIS — M8589 Other specified disorders of bone density and structure, multiple sites: Secondary | ICD-10-CM

## 2019-11-16 NOTE — Patient Instructions (Signed)
Standing Labs We placed an order today for your standing lab work.   Please have your standing labs drawn in October and every 3 months  If possible, please have your labs drawn 2 weeks prior to your appointment so that the provider can discuss your results at your appointment.  We have open lab daily Monday through Thursday from 8:30-12:30 PM and 1:30-4:30 PM and Friday from 8:30-12:30 PM and 1:30-4:00 PM at the office of Dr. Bo Merino, [facility].   Please be advised, patients with office appointments requiring lab work will take precedents over walk-in lab work.  If possible, please come for your lab work on Monday and Friday afternoons, as you may experience shorter wait times. The office is located at [address], [place], [place], [place] [address] No appointment is necessary.   Labs are drawn by [facility]. Please bring your co-pay at the time of your lab draw.  You may receive a bill from [facility] for your lab work.  If you wish to have your labs drawn at another location, please call the office 24 hours in advance to send orders.  If you have any questions regarding directions or hours of operation,  please call [phone].   As a reminder, please drink plenty of water prior to coming for your lab work. Thanks!   COVID-19 vaccine recommendations:   COVID-19 vaccine is recommended for everyone (unless you are allergic to a vaccine component), even if you are on a medication that suppresses your immune system.   If you are on Methotrexate, Cellcept (mycophenolate), Rinvoq, Morrie Sheldon, and Olumiant- hold the medication for 1 week after each vaccine. Hold Methotrexate for 2 weeks after the single dose COVID-19 vaccine.   If you are on Orencia subcutaneous injection - hold medication one week prior to and one week after the first COVID-19 vaccine dose (only).   If you are on Orencia IV infusions- time vaccination administration so that the first COVID-19  vaccination will occur four weeks after the infusion and postpone the subsequent infusion by one week.   If you are on Cyclophosphamide or Rituxan infusions please contact your doctor prior to receiving the COVID-19 vaccine.   Do not take Tylenol or ant anti-inflammatory medications (NSAIDs) 24 hours prior to the COVID-19 vaccination.   There is no direct evidence about the efficacy of the COVID-19 vaccine in individuals who are on medications that suppress the immune system.   Even if you are fully vaccinated, and you are on any medications that suppress your immune system, please continue to wear a mask, maintain at least six feet social distance and practice hand hygiene.   If you develop a COVID-19 infection, please contact your PCP or our office to determine if you need antibody infusion.  We anticipate that a booster vaccine will be available soon for immunosuppressed individuals. Please cal our office before receiving your booster dose to make adjustments to your medication regimen.   [email or web address]

## 2019-11-17 ENCOUNTER — Encounter: Payer: Self-pay | Admitting: Genetic Counselor

## 2019-11-30 ENCOUNTER — Telehealth: Payer: Self-pay | Admitting: Rheumatology

## 2019-11-30 NOTE — Telephone Encounter (Signed)
Please call to check on patient next week.

## 2019-11-30 NOTE — Telephone Encounter (Signed)
Patient called stating she was diagnosed today with COVID.  Patient states she has been experiencing a lot of coughing and has chest congestion.  Patient states she had the Moderna vaccine on 06/18/19 and 07/20/19.  Patient is requesting a return call to discuss what she should do about her medications.

## 2019-11-30 NOTE — Telephone Encounter (Signed)
Patient states she tested positive for Covid today. Patient states she has been experiencing a lot of coughing and has chest congestion.  Patient states she had the Moderna vaccine on 06/18/19 and 07/20/19.  Patient advised to hold her Orencia and MTX for 2-3 weeks after symptoms have resolved. Patient advised we will be referring her for an antibody infusion. Patient advised they will reach out to her to schedule.

## 2019-12-01 ENCOUNTER — Other Ambulatory Visit: Payer: Self-pay | Admitting: Oncology

## 2019-12-01 DIAGNOSIS — U071 COVID-19: Secondary | ICD-10-CM

## 2019-12-01 NOTE — Progress Notes (Signed)
I connected by phone with  [name] [name] to discuss the potential use of an new treatment for mild to moderate COVID-19 viral infection in non-hospitalized patients.   This patient is a age/sex that meets the FDA criteria for Emergency Use Authorization of casirivimab\imdevimab.  Has a (+) direct SARS-CoV-2 viral test result 1. Has mild or moderate COVID-19  2. Is ? 57 years of age and weighs ? 40 kg 3. Is NOT hospitalized due to COVID-19 4. Is NOT requiring oxygen therapy or requiring an increase in baseline oxygen flow rate due to COVID-19 5. Is within 10 days of symptom onset 6. Has at least one of the high risk factor(s) for progression to severe COVID-19 and/or hospitalization as defined in EUA. ? Specific high risk criteria : RA on MTX- immunocompromised   Symptom onset  11/28/19   I have spoken and communicated the following to the patient or parent/caregiver:   1. FDA has authorized the emergency use of casirivimab\imdevimab for the treatment of mild to moderate COVID-19 in adults and pediatric patients with positive results of direct SARS-CoV-2 viral testing who are 81 years of age and older weighing at least 40 kg, and who are at high risk for progressing to severe COVID-19 and/or hospitalization.   2. The significant known and potential risks and benefits of casirivimab\imdevimab, and the extent to which such potential risks and benefits are unknown.   3. Information on available alternative treatments and the risks and benefits of those alternatives, including clinical trials.   4. Patients treated with casirivimab\imdevimab should continue to self-isolate and use infection control measures (e.g., wear mask, isolate, social distance, avoid sharing personal items, clean and disinfect "high touch" surfaces, and frequent handwashing) according to CDC guidelines.    5. The patient or parent/caregiver has the option to accept or refuse casirivimab\imdevimab .   After reviewing this  information with the patient, The patient agreed to proceed with receiving casirivimab\imdevimab infusion and will be provided a copy of the Fact sheet prior to receiving the infusion.Rulon Abide, AGNP-C [phone] ([place])

## 2019-12-02 ENCOUNTER — Ambulatory Visit (HOSPITAL_COMMUNITY)
Admission: RE | Admit: 2019-12-02 | Discharge: 2019-12-02 | Disposition: A | Discharge status: Home / Self Care | Payer: No Typology Code available for payment source | Source: Ambulatory Visit | Attending: Pulmonary Disease | Admitting: Pulmonary Disease

## 2019-12-02 DIAGNOSIS — U071 COVID-19: Secondary | ICD-10-CM | POA: Diagnosis not present

## 2019-12-02 MED ORDER — FAMOTIDINE IN NACL 20-0.9 MG/50ML-% IV SOLN: 20.0000 mg | Freq: Once | INTRAVENOUS | Status: DC | PRN

## 2019-12-02 MED ORDER — ALBUTEROL SULFATE HFA 108 (90 BASE) MCG/ACT IN AERS: 2.0000 | INHALATION_SPRAY | Freq: Once | RESPIRATORY_TRACT | Status: DC | PRN

## 2019-12-02 MED ORDER — SODIUM CHLORIDE 0.9 % IV SOLN: INTRAVENOUS | Status: DC | PRN

## 2019-12-02 MED ORDER — DIPHENHYDRAMINE HCL 50 MG/ML IJ SOLN: 50.0000 mg | Freq: Once | INTRAMUSCULAR | Status: DC | PRN

## 2019-12-02 MED ORDER — METHYLPREDNISOLONE SODIUM SUCC 125 MG IJ SOLR: 125.0000 mg | Freq: Once | INTRAMUSCULAR | Status: DC | PRN

## 2019-12-02 MED ORDER — SODIUM CHLORIDE 0.9 % IV SOLN
1200.0000 mg | Freq: Once | INTRAVENOUS | Status: AC
  Administered 2019-12-02: 1200 mg via INTRAVENOUS

## 2019-12-02 MED ORDER — EPINEPHRINE 0.3 MG/0.3ML IJ SOAJ: 0.3000 mg | Freq: Once | INTRAMUSCULAR | Status: DC | PRN

## 2019-12-02 NOTE — Progress Notes (Signed)
  Diagnosis: COVID-19  Physician: Dr Asencion Noble  Procedure: Cov[facility] Infusion Clinic Med: casirivimab\imdevimab infusion - Provided patient with casirivimab\imdevimab fact sheet for patients, parents and caregivers prior to infusion.  Complications: No immediate complications noted.  Discharge: Discharged home   [name] [name] 12/02/2019

## 2019-12-02 NOTE — Discharge Instructions (Signed)

## 2019-12-05 NOTE — Telephone Encounter (Signed)
Patient she is feeling better. Patient states she had her vaccines on June 18, 2019 and July 20, 2019. Patient would like to know when she should get the booster and the flu vaccine. Patient states she did have the antibody infusion. Please advise.

## 2019-12-05 NOTE — Telephone Encounter (Signed)
Patient advised as far as Dr. Estanislado Pandy knows she has to wait for about 3 months after the infection.  The recommendations could have changed.  She should contact her PCP and get the information regarding her third vaccine for COVID-19.  We recommend spacing COVID-19 and flu vaccine at least a week. Patient verbalized understanding.

## 2019-12-05 NOTE — Telephone Encounter (Signed)
As far as I know the patient has to wait for about 3 months after the infection.  The recommendations could have changed.  She should contact her PCP and get the information regarding her third vaccine for COVID-19.  We recommend spacing COVID-19 and flu vaccine at least a week.

## 2019-12-27 ENCOUNTER — Telehealth: Payer: Self-pay | Admitting: Rheumatology

## 2019-12-27 NOTE — Telephone Encounter (Signed)
Please advise the patient to be cleared by her PCP prior to resuming MTX and Orencia.

## 2019-12-27 NOTE — Telephone Encounter (Signed)
Patient advised to be cleared by her PCP prior to resuming MTX and Orencia. Patient expressed understanding.

## 2019-12-27 NOTE — Telephone Encounter (Signed)
Patient advised Ok to resume MTX and Orencia 7-14 days after the antibody infusion if her covid symptoms have completely resolved.   Patient states she is still having a cough and mucous. Patient denies fever, body aches or shortness of breath. Please advise if she is okay to resume.

## 2019-12-27 NOTE — Telephone Encounter (Signed)
Ok to resume MTX and Orencia 7-14 days after the antibody infusion if her covid symptoms have completely resolved.

## 2019-12-27 NOTE — Telephone Encounter (Signed)
Patient called requesting a return call to let her know when she can restart her Methotrexate and Orencia medications.  Patient states she was diagnosed with COVID on 11/30/19 and had infusion on 12/02/19.

## 2020-01-25 NOTE — Progress Notes (Deleted)
Office Visit Note  Patient: [name] [name]             Date of Birth: [birth year]           MRN: [id number]             PCP: Kelton Pillar, MD Referring: Kelton Pillar, MD Visit Date: 02/08/2020 Occupation: @GUAROCC @  Subjective:  No chief complaint on file.   History of Present Illness: [name] [name] is a 59 y.o. female ***   Activities of Daily Living:  Patient reports morning stiffness for *** {minute/hour:19697}.   Patient {ACTIONS;DENIES/REPORTS:21021675::"Denies"} nocturnal pain.  Difficulty dressing/grooming: {ACTIONS;DENIES/REPORTS:21021675::"Denies"} Difficulty climbing stairs: {ACTIONS;DENIES/REPORTS:21021675::"Denies"} Difficulty getting out of chair: {ACTIONS;DENIES/REPORTS:21021675::"Denies"} Difficulty using hands for taps, buttons, cutlery, and/or writing: {ACTIONS;DENIES/REPORTS:21021675::"Denies"}  No Rheumatology ROS completed.   PMFS History:  Patient Active Problem List   Diagnosis Date Noted  . Status post total left knee replacement 02/28/2019  . Chronic pain of left knee 02/28/2019  . Status post total knee replacement, bilateral 12/01/2016  . Status post bilateral foot surgery 12/01/2016  . Contracture of right elbow 06/24/2016  . Contracture of left elbow 06/24/2016  . Rheumatoid arthritis involving multiple sites with positive rheumatoid factor ([place]) 05/27/2016  . ANA positive 05/27/2016  . Lateral epicondylitis, left elbow 05/27/2016  . Trochanteric bursitis of left hip 05/27/2016  . High risk medication use 05/27/2016  . DJD (degenerative joint disease), cervical 05/27/2016  . Osteopenia of neck of left femur 05/27/2016  . Primary osteoarthritis of both feet 05/27/2016  . S/P TAH-BSO (5/30) 09/04/2012    Past Medical History:  Diagnosis Date  . Arthritis    rheumotroid  . Complication of anesthesia    "irregular heartbeat" during neck surgery  . Fibroid   . Rheumatoid arteritis ([place])    age 16  .  Urinary incontinence     Family History  Problem Relation Age of Onset  . Ovarian cancer Mother 77  . Lung cancer Father        asbestos  . Cancer Brother 50       appendiceal  . Seizures Brother   . Heart Problems Brother   . Colon cancer Maternal Aunt   . Breast cancer Paternal Aunt 44  . Colon cancer Maternal Grandmother 77  . Stroke Paternal Grandmother   . Heart disease Paternal Grandmother   . Breast cancer Paternal Aunt 71   Past Surgical History:  Procedure Laterality Date  . ABDOMINAL HYSTERECTOMY N/A 09/03/2012   Procedure: HYSTERECTOMY ABDOMINAL;  Surgeon: Marvene Staff, MD;  Location: [facility];  Service: Gynecology;  Laterality: N/A;  . FOOT SURGERY Bilateral    joint implants, each foot x 2  . JOINT REPLACEMENT    . KNEE SURGERY Right 2006   fracture knee after replacement, repaired  . POSTERIOR FUSION CERVICAL SPINE  2006  . REPLACEMENT TOTAL KNEE Bilateral 1989   left 1989; right 1996  . SALPINGOOPHORECTOMY Bilateral 09/03/2012   Procedure: SALPINGO OOPHORECTOMY;  Surgeon: Marvene Staff, MD;  Location: [facility];  Service: Gynecology;  Laterality: Bilateral;  . TONSILLECTOMY  1978   Social History   Social History Narrative  . Not on file   Immunization History  Administered Date(s) Administered  . Influenza,inj,Quad PF,6+ Mos 01/22/2018, 02/02/2019  . Moderna SARS-COVID-2 Vaccination 06/18/2019, 07/20/2019     Objective: Vital Signs: LMP 04/07/2012    Physical Exam   Musculoskeletal Exam: ***  CDAI Exam: CDAI Score: -- Patient Global: --; Provider Global: --  Swollen: --; Tender: -- Joint Exam 02/08/2020   No joint exam has been documented for this visit   There is currently no information documented on the homunculus. Go to the Rheumatology activity and complete the homunculus joint exam.  Investigation: No additional findings.  Imaging: No results found.  Recent Labs: Lab Results  Component Value Date   WBC 8.2 10/13/2019    HGB 13.8 10/13/2019   PLT 299 10/13/2019   NA 139 10/13/2019   K 4.3 10/13/2019   CL 102 10/13/2019   CO2 28 10/13/2019   GLUCOSE 83 10/13/2019   BUN 12 10/13/2019   CREATININE 0.71 10/13/2019   BILITOT 0.4 10/13/2019   ALKPHOS 83 10/18/2016   AST 22 10/13/2019   ALT 16 10/13/2019   PROT 7.1 10/13/2019   ALBUMIN 4.0 10/18/2016   CALCIUM 9.9 10/13/2019   GFRAA 110 10/13/2019   QFTBGOLD NEGATIVE 02/06/2017   QFTBGOLDPLUS NEGATIVE 04/05/2019    Speciality Comments: No specialty comments available.  Procedures:  No procedures performed Allergies: Sulfa antibiotics, Morphine and related, and Penicillins   Assessment / Plan:     Visit Diagnoses: No diagnosis found.  Orders: No orders of the defined types were placed in this encounter.  No orders of the defined types were placed in this encounter.   Face-to-face time spent with patient was *** minutes. Greater than 50% of time was spent in counseling and coordination of care.  Follow-Up Instructions: No follow-ups on file.   Earnestine Mealing, CMA  Note - This record has been created using Editor, commissioning.  Chart creation errors have been sought, but may not always  have been located. Such creation errors do not reflect on  the standard of medical care.

## 2020-01-27 ENCOUNTER — Other Ambulatory Visit: Payer: Self-pay

## 2020-01-27 DIAGNOSIS — Z79899 Other long term (current) drug therapy: Secondary | ICD-10-CM

## 2020-01-28 LAB — COMPLETE METABOLIC PANEL WITH GFR
AG Ratio: 1.3 (calc) (ref 1.0–2.5)
ALT: 13 U/L (ref 6–29)
AST: 20 U/L (ref 10–35)
Albumin: 3.7 g/dL (ref 3.6–5.1)
Alkaline phosphatase (APISO): 74 U/L (ref 37–153)
BUN: 8 mg/dL (ref 7–25)
CO2: 28 mmol/L (ref 20–32)
Calcium: 9.7 mg/dL (ref 8.6–10.4)
Chloride: 107 mmol/L (ref 98–110)
Creat: 0.76 mg/dL (ref 0.50–1.05)
GFR, Est African American: 102 mL/min/{1.73_m2} (ref >=60)
GFR, Est Non African American: 88 mL/min/{1.73_m2} (ref >=60)
Globulin: 2.9 g/dL (calc) (ref 1.9–3.7)
Glucose, Bld: 91 mg/dL (ref 65–99)
Potassium: 4.5 mmol/L (ref 3.5–5.3)
Sodium: 139 mmol/L (ref 135–146)
Total Bilirubin: 0.6 mg/dL (ref 0.2–1.2)
Total Protein: 6.6 g/dL (ref 6.1–8.1)

## 2020-01-28 LAB — CBC WITH DIFFERENTIAL/PLATELET
Absolute Monocytes: 332 cells/uL (ref 200–950)
Basophils Absolute: 78 cells/uL (ref 0–200)
Basophils Relative: 1.2 %
Eosinophils Absolute: 163 cells/uL (ref 15–500)
Eosinophils Relative: 2.5 %
HCT: 43.1 % (ref 35.0–45.0)
Hemoglobin: 13.7 g/dL (ref 11.7–15.5)
Lymphs Abs: 2230 cells/uL (ref 850–3900)
MCH: 27 pg (ref 27.0–33.0)
MCHC: 31.8 g/dL — ABNORMAL LOW (ref 32.0–36.0)
MCV: 84.8 fL (ref 80.0–100.0)
MPV: 11.1 fL (ref 7.5–12.5)
Monocytes Relative: 5.1 %
Neutro Abs: 3699 cells/uL (ref 1500–7800)
Neutrophils Relative %: 56.9 %
Platelets: 380 10*3/uL (ref 140–400)
RBC: 5.08 10*6/uL (ref 3.80–5.10)
RDW: 13.4 % (ref 11.0–15.0)
Total Lymphocyte: 34.3 %
WBC: 6.5 10*3/uL (ref 3.8–10.8)

## 2020-01-30 NOTE — Progress Notes (Signed)
CBC and CMP WNL

## 2020-02-08 ENCOUNTER — Ambulatory Visit: Payer: No Typology Code available for payment source | Admitting: Rheumatology

## 2020-02-08 DIAGNOSIS — M24521 Contracture, right elbow: Secondary | ICD-10-CM

## 2020-02-08 DIAGNOSIS — M8589 Other specified disorders of bone density and structure, multiple sites: Secondary | ICD-10-CM

## 2020-02-08 DIAGNOSIS — Z96653 Presence of artificial knee joint, bilateral: Secondary | ICD-10-CM

## 2020-02-08 DIAGNOSIS — E6609 Other obesity due to excess calories: Secondary | ICD-10-CM

## 2020-02-08 DIAGNOSIS — M5030 Other cervical disc degeneration, unspecified cervical region: Secondary | ICD-10-CM

## 2020-02-08 DIAGNOSIS — M0579 Rheumatoid arthritis with rheumatoid factor of multiple sites without organ or systems involvement: Secondary | ICD-10-CM

## 2020-02-08 DIAGNOSIS — Z79899 Other long term (current) drug therapy: Secondary | ICD-10-CM

## 2020-02-08 DIAGNOSIS — M24522 Contracture, left elbow: Secondary | ICD-10-CM

## 2020-02-09 ENCOUNTER — Telehealth: Payer: Self-pay

## 2020-02-09 MED ORDER — METHOTREXATE 2.5 MG PO TABS
17.5000 mg | ORAL_TABLET | ORAL | 0 refills | Status: DC
Start: 2020-02-09 — End: 2020-05-10

## 2020-02-09 MED ORDER — FOLIC ACID 1 MG PO TABS: ORAL_TABLET | ORAL | 3 refills | Status: DC

## 2020-02-09 NOTE — Telephone Encounter (Signed)
Patient called requesting prescription refill of Methotrexate and Folic Acid to be sent to her [facility] at [address] in [place].

## 2020-02-09 NOTE — Telephone Encounter (Signed)
Last Visit: 11/16/2019  Next Visit: 04/18/2020 Labs: 01/27/2020  Current Dose per office note 11/16/2019: methotrexate 7 tablets by mouth once weekly, and folic acid 2 mg by mouth daily. DX: Rheumatoid arthritis involving multiple sites with positive rheumatoid factor   Okay to refill per Dr.Deveshwar

## 2020-03-07 ENCOUNTER — Telehealth: Payer: Self-pay

## 2020-03-07 NOTE — Telephone Encounter (Signed)
Patient called stating she had her Covid booster yesterday and is requesting a return call to let her know if she needs to wait til next week to take her Orencia and Methotrexate.

## 2020-03-07 NOTE — Telephone Encounter (Signed)
Patient advised of the following:  COVID-19 vaccine recommendations:   COVID-19 vaccine is recommended for everyone (unless you are allergic to a vaccine component), even if you are on a medication that suppresses your immune system.   If you are on Methotrexate, Cellcept (mycophenolate), Rinvoq, Morrie Sheldon, and Olumiant- hold the medication for 1 week after each vaccine. Hold Methotrexate for 2 weeks after the single dose COVID-19 vaccine.   If you are on Orencia subcutaneous injection - hold medication one week prior to and one week after the first COVID-19 vaccine dose (only).   If you are on Orencia IV infusions- time vaccination administration so that the first COVID-19 vaccination will occur four weeks after the infusion and postpone the subsequent infusion by one week.   If you are on Cyclophosphamide or Rituxan infusions please contact your doctor prior to receiving the COVID-19 vaccine.   Do not take Tylenol or any anti-inflammatory medications (NSAIDs) 24 hours prior to the COVID-19 vaccination.   There is no direct evidence about the efficacy of the COVID-19 vaccine in individuals who are on medications that suppress the immune system.   Even if you are fully vaccinated, and you are on any medications that suppress your immune system, please continue to wear a mask, maintain at least six feet social distance and practice hand hygiene.   If you develop a COVID-19 infection, please contact your PCP or our office to determine if you need monoclonal antibody infusion.  The booster vaccine is now available for immunocompromised patients.   Please see the following web sites for updated information.   [email or web address]

## 2020-03-23 ENCOUNTER — Telehealth: Payer: Self-pay

## 2020-03-23 NOTE — Telephone Encounter (Signed)
Patient advised the stair lift prescription has been faxed. Patient expressed understanding.

## 2020-03-23 NOTE — Telephone Encounter (Signed)
Patient called requesting to speak with you directly.  °

## 2020-04-04 NOTE — Progress Notes (Signed)
Office Visit Note  Patient: [name] [name]             Date of Birth: 1961-03-28           MRN: [id number]             PCP: Kelton Pillar, MD Referring: Kelton Pillar, MD Visit Date: 04/18/2020 Occupation: @GUAROCC @  Subjective:  Other (Left knee pain )   History of Present Illness: [name] [name] is a 59 y.o. female with history of seropositive rheumatoid arthritis and osteoarthritis.  She states she had COVID-19 infection in August 2021.  She received monoclonal antibodies after that and recovered gradually.  She was off Orencia and methotrexate for almost a month after the COVID-19 infection.  She had bronchitis in October and had to come off the medications again.  She has been experiencing increased pain in her hands and her knee joints with the weather change.  She has been taking Orencia and methotrexate on a regular basis without missing any dosage.  Activities of Daily Living:  Patient reports morning stiffness for 10  minutes.   Patient Denies nocturnal pain.  Difficulty dressing/grooming: Denies Difficulty climbing stairs: Reports Difficulty getting out of chair: Reports Difficulty using hands for taps, buttons, cutlery, and/or writing: Denies  Review of Systems  Constitutional: Negative for fatigue.  HENT: Positive for nose dryness. Negative for mouth sores and mouth dryness.   Eyes: Negative for pain, itching and dryness.  Respiratory: Negative for shortness of breath and difficulty breathing.   Cardiovascular: Negative for chest pain and palpitations.  Gastrointestinal: Negative for blood in stool, constipation and diarrhea.  Endocrine: Negative for increased urination.  Genitourinary: Negative for difficulty urinating.  Musculoskeletal: Positive for arthralgias, joint pain, joint swelling and morning stiffness. Negative for myalgias, muscle tenderness and myalgias.  Skin: Negative for color change, rash and redness.   Allergic/Immunologic: Negative for susceptible to infections.  Neurological: Negative for dizziness, numbness, headaches, memory loss and weakness.  Hematological: Negative for bruising/bleeding tendency.  Psychiatric/Behavioral: Negative for confusion.    PMFS History:  Patient Active Problem List   Diagnosis Date Noted  . Status post total left knee replacement 02/28/2019  . Chronic pain of left knee 02/28/2019  . Status post total knee replacement, bilateral 12/01/2016  . Status post bilateral foot surgery 12/01/2016  . Contracture of right elbow 06/24/2016  . Contracture of left elbow 06/24/2016  . Rheumatoid arthritis involving multiple sites with positive rheumatoid factor ([place]) 05/27/2016  . ANA positive 05/27/2016  . Lateral epicondylitis, left elbow 05/27/2016  . Trochanteric bursitis of left hip 05/27/2016  . High risk medication use 05/27/2016  . DJD (degenerative joint disease), cervical 05/27/2016  . Osteopenia of neck of left femur 05/27/2016  . Primary osteoarthritis of both feet 05/27/2016  . S/P TAH-BSO (5/30) 09/04/2012    Past Medical History:  Diagnosis Date  . Arthritis    rheumotroid  . Complication of anesthesia    "irregular heartbeat" during neck surgery  . Fibroid   . Rheumatoid arteritis ([place])    age 73  . Urinary incontinence     Family History  Problem Relation Age of Onset  . Ovarian cancer Mother 78  . Lung cancer Father        asbestos  . Cancer Brother 55       appendiceal  . Seizures Brother   . Heart Problems Brother   . Colon cancer Maternal Aunt   . Breast cancer Paternal Aunt 5  .  Colon cancer Maternal Grandmother 11  . Stroke Paternal Grandmother   . Heart disease Paternal Grandmother   . Breast cancer Paternal Aunt 23   Past Surgical History:  Procedure Laterality Date  . ABDOMINAL HYSTERECTOMY N/A 09/03/2012   Procedure: HYSTERECTOMY ABDOMINAL;  Surgeon: Serita Kyle, MD;  Location: [facility];  Service: Gynecology;   Laterality: N/A;  . FOOT SURGERY Bilateral    joint implants, each foot x 2  . JOINT REPLACEMENT    . KNEE SURGERY Right 2006   fracture knee after replacement, repaired  . POSTERIOR FUSION CERVICAL SPINE  2006  . REPLACEMENT TOTAL KNEE Bilateral 1989   left 1989; right 1996  . SALPINGOOPHORECTOMY Bilateral 09/03/2012   Procedure: SALPINGO OOPHORECTOMY;  Surgeon: Serita Kyle, MD;  Location: [facility];  Service: Gynecology;  Laterality: Bilateral;  . TONSILLECTOMY  1978   Social History   Social History Narrative  . Not on file   Immunization History  Administered Date(s) Administered  . Influenza,inj,Quad PF,6+ Mos 01/22/2018, 02/02/2019  . Moderna Sars-Covid-2 Vaccination 06/18/2019, 07/20/2019, 03/06/2020     Objective: Vital Signs: BP 122/80 (BP Location: Left Arm, Patient Position: Sitting, Cuff Size: Normal)   Pulse 78   Resp 16   Ht 5\' 2"  (1.575 m)   Wt 166 lb (75.3 kg)   LMP 04/07/2012   BMI 30.36 kg/m    Physical Exam Vitals and nursing note reviewed.  Constitutional:      Appearance: She is well-developed and well-nourished.  HENT:     Head: Normocephalic and atraumatic.  Eyes:     Extraocular Movements: EOM normal.     Conjunctiva/sclera: Conjunctivae normal.  Cardiovascular:     Rate and Rhythm: Normal rate and regular rhythm.     Pulses: Intact distal pulses.     Heart sounds: Normal heart sounds.  Pulmonary:     Effort: Pulmonary effort is normal.     Breath sounds: Normal breath sounds.  Abdominal:     General: Bowel sounds are normal.     Palpations: Abdomen is soft.  Musculoskeletal:     Cervical back: Normal range of motion.  Lymphadenopathy:     Cervical: No cervical adenopathy.  Skin:    General: Skin is warm and dry.     Capillary Refill: Capillary refill takes less than 2 seconds.  Neurological:     Mental Status: She is alert and oriented to person, place, and time.  Psychiatric:        Mood and Affect: Mood and affect normal.         Behavior: Behavior normal.      Musculoskeletal Exam: Patient has very limited range of motion of her cervical spine.  She has contractures in her bilateral elbows.  She has  minimal range of motion in her wrist joints.  Synovial thickening was noted over bilateral MCP joints with ulnar deviation but no synovitis was noted.  She had good grip range of motion of her hip joints.  She has warmth and swelling in her left knee joint without effusion.  She had limited range of motion of bilateral ankle joints.  No tenderness over ankles or MTPs was noted.  CDAI Exam: CDAI Score: 2.8  Patient Global: 4 mm; Provider Global: 4 mm Swollen: 1 ; Tender: 1  Joint Exam 04/18/2020      Right  Left  Knee     Swollen Tender     Investigation: No additional findings.  Imaging: No results found.  Recent  Labs: Lab Results  Component Value Date   WBC 6.5 01/27/2020   HGB 13.7 01/27/2020   PLT 380 01/27/2020   NA 139 01/27/2020   K 4.5 01/27/2020   CL 107 01/27/2020   CO2 28 01/27/2020   GLUCOSE 91 01/27/2020   BUN 8 01/27/2020   CREATININE 0.76 01/27/2020   BILITOT 0.6 01/27/2020   ALKPHOS 83 10/18/2016   AST 20 01/27/2020   ALT 13 01/27/2020   PROT 6.6 01/27/2020   ALBUMIN 4.0 10/18/2016   CALCIUM 9.7 01/27/2020   GFRAA 102 01/27/2020   QFTBGOLD NEGATIVE 02/06/2017   QFTBGOLDPLUS NEGATIVE 04/05/2019    Speciality Comments: No specialty comments available.  Procedures:  No procedures performed Allergies: Sulfa antibiotics, Morphine and related, and Penicillins   Assessment / Plan:     Visit Diagnoses: Rheumatoid arthritis involving multiple sites with positive rheumatoid factor ([facility]) - RF+, CCP+, ANA+, erosive disease.  She has severe end-stage disease.  She is on Orencia and methotrexate combination.  She had interruption of treatment due to COVID-19 infection and bronchitis.  She has been experiencing increased discomfort.  She has warmth on palpation of her left knee  joint today.    High risk medication use - Orencia 125 mg subcutaneous injections weekly, methotrexate 7 tablets by mouth once weekly, and folic acid 2 mg by mouth daily. - Plan: CBC with Differential/Platelet, COMPLETE METABOLIC PANEL WITH GFR, QuantiFERON-TB Gold Plus today.  Contracture of right elbow-chronic without synovitis.  Contracture of left elbow-without synovitis  Status post total knee replacement, bilateral-she has warmth on palpation of her left knee joint.  If her symptoms persist she may need a cortisone injection through her orthopedic surgeon.  DDD (degenerative disc disease), cervical-she has very limited range of motion due to disc disease and rheumatoid arthritis.  Osteopenia of multiple sites - Aug 19, 2019 DEXA scan showed T score of -1.8.  She will get repeat DEXA in 2023.  Class 1 obesity due to excess calories without serious comorbidity with body mass index (BMI) of 31.0 to 31.9 in adult  COVID-19 virus infection - 11/2019.  She is fully vaccinated against COVID-19.  Patient already received a booster.  Use of mask, social distancing and hand hygiene was discussed.  Orders: Orders Placed This Encounter  Procedures  . CBC with Differential/Platelet  . COMPLETE METABOLIC PANEL WITH GFR  . QuantiFERON-TB Gold Plus   No orders of the defined types were placed in this encounter.     Follow-Up Instructions: Return in about 3 months (around 07/17/2020) for Rheumatoid arthritis.   Bo Merino, MD  Note - This record has been created using Editor, commissioning.  Chart creation errors have been sought, but may not always  have been located. Such creation errors do not reflect on  the standard of medical care.

## 2020-04-18 ENCOUNTER — Encounter: Payer: Self-pay | Admitting: Rheumatology

## 2020-04-18 ENCOUNTER — Other Ambulatory Visit: Payer: Self-pay

## 2020-04-18 ENCOUNTER — Ambulatory Visit: Payer: No Typology Code available for payment source | Admitting: Rheumatology

## 2020-04-18 VITALS — BP 122/80 | HR 78 | Resp 16 | Ht 62.0 in | Wt 166.0 lb

## 2020-04-18 DIAGNOSIS — M0579 Rheumatoid arthritis with rheumatoid factor of multiple sites without organ or systems involvement: Secondary | ICD-10-CM

## 2020-04-18 DIAGNOSIS — Z6831 Body mass index (BMI) 31.0-31.9, adult: Secondary | ICD-10-CM

## 2020-04-18 DIAGNOSIS — E6609 Other obesity due to excess calories: Secondary | ICD-10-CM

## 2020-04-18 DIAGNOSIS — M24521 Contracture, right elbow: Secondary | ICD-10-CM | POA: Diagnosis not present

## 2020-04-18 DIAGNOSIS — Z79899 Other long term (current) drug therapy: Secondary | ICD-10-CM | POA: Diagnosis not present

## 2020-04-18 DIAGNOSIS — Z96653 Presence of artificial knee joint, bilateral: Secondary | ICD-10-CM

## 2020-04-18 DIAGNOSIS — M8589 Other specified disorders of bone density and structure, multiple sites: Secondary | ICD-10-CM

## 2020-04-18 DIAGNOSIS — M5030 Other cervical disc degeneration, unspecified cervical region: Secondary | ICD-10-CM

## 2020-04-18 DIAGNOSIS — M24522 Contracture, left elbow: Secondary | ICD-10-CM | POA: Diagnosis not present

## 2020-04-18 DIAGNOSIS — U071 COVID-19: Secondary | ICD-10-CM

## 2020-04-18 NOTE — Patient Instructions (Signed)
Standing Labs We placed an order today for your standing lab work.   Please have your standing labs drawn in April and every 3 months   If possible, please have your labs drawn 2 weeks prior to your appointment so that the provider can discuss your results at your appointment.  We have open lab daily Monday through Thursday from 8:30-12:30 PM and 1:30-4:30 PM and Friday from 8:30-12:30 PM and 1:30-4:00 PM at the office of Dr. Lenell Lama, [facility].   Please be advised, patients with office appointments requiring lab work will take precedents over walk-in lab work.  If possible, please come for your lab work on Monday and Friday afternoons, as you may experience shorter wait times. The office is located at [address], [address], [place], [place] [address] No appointment is necessary.   Labs are drawn by [facility]. Please bring your co-pay at the time of your lab draw.  You may receive a bill from [facility] for your lab work.  If you wish to have your labs drawn at another location, please call the office 24 hours in advance to send orders.  If you have any questions regarding directions or hours of operation,  please call [phone].   As a reminder, please drink plenty of water prior to coming for your lab work. Thanks!   

## 2020-04-19 NOTE — Progress Notes (Signed)
CBC and CMP are normal.

## 2020-04-20 LAB — COMPLETE METABOLIC PANEL WITH GFR
AG Ratio: 1.4 (calc) (ref 1.0–2.5)
ALT: 13 U/L (ref 6–29)
AST: 18 U/L (ref 10–35)
Albumin: 4.2 g/dL (ref 3.6–5.1)
Alkaline phosphatase (APISO): 82 U/L (ref 37–153)
BUN: 19 mg/dL (ref 7–25)
CO2: 30 mmol/L (ref 20–32)
Calcium: 10.2 mg/dL (ref 8.6–10.4)
Chloride: 105 mmol/L (ref 98–110)
Creat: 0.83 mg/dL (ref 0.50–1.05)
GFR, Est African American: 91 mL/min/{1.73_m2} (ref >=60)
GFR, Est Non African American: 78 mL/min/{1.73_m2} (ref >=60)
Globulin: 3.1 g/dL (calc) (ref 1.9–3.7)
Glucose, Bld: 84 mg/dL (ref 65–99)
Potassium: 4.6 mmol/L (ref 3.5–5.3)
Sodium: 142 mmol/L (ref 135–146)
Total Bilirubin: 0.3 mg/dL (ref 0.2–1.2)
Total Protein: 7.3 g/dL (ref 6.1–8.1)

## 2020-04-20 LAB — QUANTIFERON-TB GOLD PLUS
Mitogen-NIL: >10 IU/mL
NIL: 0.03 IU/mL
QuantiFERON-TB Gold Plus: NEGATIVE
TB1-NIL: 0 IU/mL
TB2-NIL: <0 IU/mL

## 2020-04-20 LAB — CBC WITH DIFFERENTIAL/PLATELET
Absolute Monocytes: 592 cells/uL (ref 200–950)
Basophils Absolute: 97 cells/uL (ref 0–200)
Basophils Relative: 1 %
Eosinophils Absolute: 252 cells/uL (ref 15–500)
Eosinophils Relative: 2.6 %
HCT: 43 % (ref 35.0–45.0)
Hemoglobin: 14.3 g/dL (ref 11.7–15.5)
Lymphs Abs: 3618 cells/uL (ref 850–3900)
MCH: 28.1 pg (ref 27.0–33.0)
MCHC: 33.3 g/dL (ref 32.0–36.0)
MCV: 84.6 fL (ref 80.0–100.0)
MPV: 11.4 fL (ref 7.5–12.5)
Monocytes Relative: 6.1 %
Neutro Abs: 5141 cells/uL (ref 1500–7800)
Neutrophils Relative %: 53 %
Platelets: 301 10*3/uL (ref 140–400)
RBC: 5.08 10*6/uL (ref 3.80–5.10)
RDW: 14.9 % (ref 11.0–15.0)
Total Lymphocyte: 37.3 %
WBC: 9.7 10*3/uL (ref 3.8–10.8)

## 2020-04-21 NOTE — Progress Notes (Signed)
TB Gold is negative.

## 2020-05-09 ENCOUNTER — Other Ambulatory Visit: Payer: Self-pay | Admitting: Rheumatology

## 2020-05-10 NOTE — Telephone Encounter (Signed)
Last Visit: 04/18/2020 Next Visit: 07/25/2020 Labs: 04/18/2020, CBC and CMP are normal.  Current Dose per office note 04/18/2020, methotrexate 7 tablets by mouth once weekly, DX:  Rheumatoid arthritis involving multiple sites with positive rheumatoid factor   Okay to refill MTX?

## 2020-05-15 ENCOUNTER — Other Ambulatory Visit: Payer: Self-pay | Admitting: Rheumatology

## 2020-05-15 NOTE — Telephone Encounter (Signed)
Last Visit: 04/18/2020 Next Visit: 07/25/2020 Labs: 04/18/2020, CBC and CMP are normal. TB Gold: 04/18/2020, negative  Current Dose per office note 04/18/2020, Orencia 125 mg subcutaneous injections weekly,  FP:[id number] arthritis involving multiple sites with positive rheumatoid factor   Last Fill: 10/18/2019  Okay to refill Orencia?

## 2020-07-03 ENCOUNTER — Telehealth: Payer: Self-pay | Admitting: Rheumatology

## 2020-07-06 NOTE — Telephone Encounter (Signed)
Opened in error

## 2020-07-13 NOTE — Progress Notes (Signed)
Office Visit Note  Patient: [name] [name]             Date of Birth: 06/20/1960           MRN: [id number]             PCP: Kelton Pillar, MD Referring: Kelton Pillar, MD Visit Date: 07/25/2020 Occupation: @GUAROCC @  Subjective:  Medication management   History of Present Illness: [name] [name] is a 60 y.o. female with a history of rheumatoid arthritis and osteoarthritis.  She states she has been doing well.  She has some popping in her left knee joint.  None of the joints are swollen.  She has been tolerating Orencia and methotrexate well.  Her bilateral knee joint replacements are doing well.  Activities of Daily Living:  Patient reports morning stiffness for 1-2 minutes.   Patient Reports nocturnal pain.  Difficulty dressing/grooming: Denies Difficulty climbing stairs: Reports Difficulty getting out of chair: Denies Difficulty using hands for taps, buttons, cutlery, and/or writing: Reports  Review of Systems  Constitutional: Negative for fatigue.  HENT: Negative for mouth sores, mouth dryness and nose dryness.   Eyes: Negative for pain, itching, visual disturbance and dryness.  Respiratory: Negative for cough, hemoptysis, shortness of breath and difficulty breathing.   Cardiovascular: Negative for chest pain, palpitations and swelling in legs/feet.  Gastrointestinal: Negative for abdominal pain, blood in stool, constipation and diarrhea.  Endocrine: Negative for increased urination.  Genitourinary: Negative for painful urination.  Musculoskeletal: Positive for arthralgias, joint pain, joint swelling and morning stiffness. Negative for myalgias, muscle weakness, muscle tenderness and myalgias.  Skin: Negative for color change, rash and redness.  Allergic/Immunologic: Negative for susceptible to infections.  Neurological: Negative for dizziness, numbness, headaches, memory loss and weakness.  Hematological: Negative for swollen glands.   Psychiatric/Behavioral: Negative for confusion and sleep disturbance.    PMFS History:  Patient Active Problem List   Diagnosis Date Noted  . Status post total left knee replacement 02/28/2019  . Chronic pain of left knee 02/28/2019  . Status post total knee replacement, bilateral 12/01/2016  . Status post bilateral foot surgery 12/01/2016  . Contracture of right elbow 06/24/2016  . Contracture of left elbow 06/24/2016  . Rheumatoid arthritis involving multiple sites with positive rheumatoid factor ([place]) 05/27/2016  . ANA positive 05/27/2016  . Lateral epicondylitis, left elbow 05/27/2016  . Trochanteric bursitis of left hip 05/27/2016  . High risk medication use 05/27/2016  . DJD (degenerative joint disease), cervical 05/27/2016  . Osteopenia of neck of left femur 05/27/2016  . Primary osteoarthritis of both feet 05/27/2016  . S/P TAH-BSO (5/30) 09/04/2012    Past Medical History:  Diagnosis Date  . Arthritis    rheumotroid  . Complication of anesthesia    "irregular heartbeat" during neck surgery  . Fibroid   . Rheumatoid arteritis ([place])    age 89  . Urinary incontinence     Family History  Problem Relation Age of Onset  . Ovarian cancer Mother 10  . Lung cancer Father        asbestos  . Cancer Brother 31       appendiceal  . Seizures Brother   . Heart Problems Brother   . Colon cancer Maternal Aunt   . Breast cancer Paternal Aunt 59  . Colon cancer Maternal Grandmother 32  . Stroke Paternal Grandmother   . Heart disease Paternal Grandmother   . Breast cancer Paternal Aunt 19   Past Surgical History:  Procedure Laterality Date  . ABDOMINAL HYSTERECTOMY N/A 09/03/2012   Procedure: HYSTERECTOMY ABDOMINAL;  Surgeon: Marvene Staff, MD;  Location: [facility];  Service: Gynecology;  Laterality: N/A;  . FOOT SURGERY Bilateral    joint implants, each foot x 2  . JOINT REPLACEMENT    . KNEE SURGERY Right 2006   fracture knee after replacement, repaired  .  POSTERIOR FUSION CERVICAL SPINE  2006  . REPLACEMENT TOTAL KNEE Bilateral 1989   left 1989; right 1996  . SALPINGOOPHORECTOMY Bilateral 09/03/2012   Procedure: SALPINGO OOPHORECTOMY;  Surgeon: Marvene Staff, MD;  Location: [facility];  Service: Gynecology;  Laterality: Bilateral;  . TONSILLECTOMY  1978   Social History   Social History Narrative  . Not on file   Immunization History  Administered Date(s) Administered  . Influenza,inj,Quad PF,6+ Mos 01/22/2018, 02/02/2019  . Moderna Sars-Covid-2 Vaccination 06/18/2019, 07/20/2019, 03/06/2020     Objective: Vital Signs: BP 112/75 (BP Location: Right Arm, Patient Position: Sitting, Cuff Size: Normal)   Pulse 61   Ht 5\' 2"  (1.575 m)   Wt 170 lb (77.1 kg)   LMP 04/07/2012   BMI 31.09 kg/m    Physical Exam Vitals and nursing note reviewed.  Constitutional:      Appearance: She is well-developed.  HENT:     Head: Normocephalic and atraumatic.  Eyes:     Conjunctiva/sclera: Conjunctivae normal.  Cardiovascular:     Rate and Rhythm: Normal rate and regular rhythm.     Heart sounds: Normal heart sounds.  Pulmonary:     Effort: Pulmonary effort is normal.     Breath sounds: Normal breath sounds.  Abdominal:     General: Bowel sounds are normal.     Palpations: Abdomen is soft.  Musculoskeletal:     Cervical back: Normal range of motion.  Lymphadenopathy:     Cervical: No cervical adenopathy.  Skin:    General: Skin is warm and dry.     Capillary Refill: Capillary refill takes less than 2 seconds.  Neurological:     Mental Status: She is alert and oriented to person, place, and time.  Psychiatric:        Behavior: Behavior normal.      Musculoskeletal Exam: She has very limited range of motion of her cervical spine.  She has good range of motion of her shoulder joints.  She has bilateral elbow joint contractures with no synovitis.  She has limited range of motion of her wrist joints.  She has synovial thickening with  ulnar deviation of her MCP joints with no synovitis.  Hip joints and knee joints with good range of motion.  Her bilateral knee joints are replaced.  There was no tenderness over knee joints, ankles or MTPs.  CDAI Exam: CDAI Score: 0.6  Patient Global: 3 mm; Provider Global: 3 mm Swollen: 0 ; Tender: 0  Joint Exam 07/25/2020   No joint exam has been documented for this visit   There is currently no information documented on the homunculus. Go to the Rheumatology activity and complete the homunculus joint exam.  Investigation: No additional findings.  Imaging: No results found.  Recent Labs: Lab Results  Component Value Date   WBC 9.7 04/18/2020   HGB 14.3 04/18/2020   PLT 301 04/18/2020   NA 142 04/18/2020   K 4.6 04/18/2020   CL 105 04/18/2020   CO2 30 04/18/2020   GLUCOSE 84 04/18/2020   BUN 19 04/18/2020   CREATININE 0.83 04/18/2020  BILITOT 0.3 04/18/2020   ALKPHOS 83 10/18/2016   AST 18 04/18/2020   ALT 13 04/18/2020   PROT 7.3 04/18/2020   ALBUMIN 4.0 10/18/2016   CALCIUM 10.2 04/18/2020   GFRAA 91 04/18/2020   QFTBGOLD NEGATIVE 02/06/2017   QFTBGOLDPLUS NEGATIVE 04/18/2020    Speciality Comments: No specialty comments available.  Procedures:  No procedures performed Allergies: Sulfa antibiotics, Morphine and related, and Penicillins   Assessment / Plan:     Visit Diagnoses: Rheumatoid arthritis involving multiple sites with positive rheumatoid factor (HCC) - RF+, CCP+, ANA+, erosive disease.  She has severe end-stage disease.  She has no synovitis on examination.  She has multiple contractures and synovial thickening.  High risk medication use - Orencia 125 mg subcutaneous injections weekly, methotrexate 7 tablets by mouth once weekly, and folic acid 2 mg by mouth daily.  - Plan: CBC with Differential/Platelet, COMPLETE METABOLIC PANEL WITH GFR today and then every 3 months to monitor for drug toxicity.  TB gold was negative on April 18, 2020.   Prescription refill for methotrexate was given  Contracture of right elbow-unchanged  Contracture of left elbow-unchanged  Status post total knee replacement, bilateral-doing well.  She has some popping in her left knee joint.  It appears to be patellofemoral.  She was advised to see an orthopedic surgeon if she has persistent symptoms.  DDD (degenerative disc disease), cervical-she has very limited range of motion.  Osteopenia of multiple sites - Aug 19, 2019 DEXA scan showed T score of -1.8.  She will get repeat DEXA in 2023.  Class 1 obesity due to excess calories without serious comorbidity with body mass index (BMI) of 31.0 to 31.9 in adult  COVID-19 virus infection - 11/2019.  She is fully vaccinated against COVID-19.  Instructions regarding the booster was also placed in the AVS.  Instructions regarding pneumococcal and Shingrix vaccines were placed in the AVS.  Orders: Orders Placed This Encounter  Procedures  . CBC with Differential/Platelet  . COMPLETE METABOLIC PANEL WITH GFR   Meds ordered this encounter  Medications  . methotrexate (RHEUMATREX) 2.5 MG tablet    Sig: TAKE 7 TABLETS(17.5 MG TOTAL) BY MOUTH ONCE A WEEK. PROTECT FROM LIGHT    Dispense:  84 tablet    Refill:  0      Follow-Up Instructions: Return in about 5 months (around 12/25/2020) for Rheumatoid arthritis, Osteoarthritis.   Bo Merino, MD  Note - This record has been created using Editor, commissioning.  Chart creation errors have been sought, but may not always  have been located. Such creation errors do not reflect on  the standard of medical care.

## 2020-07-25 ENCOUNTER — Encounter: Payer: Self-pay | Admitting: Rheumatology

## 2020-07-25 ENCOUNTER — Ambulatory Visit (INDEPENDENT_AMBULATORY_CARE_PROVIDER_SITE_OTHER): Payer: No Typology Code available for payment source | Admitting: Rheumatology

## 2020-07-25 ENCOUNTER — Other Ambulatory Visit: Payer: Self-pay

## 2020-07-25 VITALS — BP 112/75 | HR 61 | Ht 62.0 in | Wt 170.0 lb

## 2020-07-25 DIAGNOSIS — Z79899 Other long term (current) drug therapy: Secondary | ICD-10-CM | POA: Diagnosis not present

## 2020-07-25 DIAGNOSIS — M24522 Contracture, left elbow: Secondary | ICD-10-CM | POA: Diagnosis not present

## 2020-07-25 DIAGNOSIS — M5030 Other cervical disc degeneration, unspecified cervical region: Secondary | ICD-10-CM

## 2020-07-25 DIAGNOSIS — E6609 Other obesity due to excess calories: Secondary | ICD-10-CM

## 2020-07-25 DIAGNOSIS — U071 COVID-19: Secondary | ICD-10-CM

## 2020-07-25 DIAGNOSIS — Z6831 Body mass index (BMI) 31.0-31.9, adult: Secondary | ICD-10-CM

## 2020-07-25 DIAGNOSIS — M0579 Rheumatoid arthritis with rheumatoid factor of multiple sites without organ or systems involvement: Secondary | ICD-10-CM | POA: Diagnosis not present

## 2020-07-25 DIAGNOSIS — M8589 Other specified disorders of bone density and structure, multiple sites: Secondary | ICD-10-CM

## 2020-07-25 DIAGNOSIS — M24521 Contracture, right elbow: Secondary | ICD-10-CM

## 2020-07-25 DIAGNOSIS — Z96653 Presence of artificial knee joint, bilateral: Secondary | ICD-10-CM

## 2020-07-25 MED ORDER — METHOTREXATE 2.5 MG PO TABS
ORAL_TABLET | ORAL | 0 refills | Status: DC
Start: 2020-07-25 — End: 2020-11-08

## 2020-07-25 NOTE — Patient Instructions (Addendum)
Standing Labs We placed an order today for your standing lab work.   Please have your standing labs drawn in July and every 3 months If possible, please have your labs drawn 2 weeks prior to your appointment so that the provider can discuss your results at your appointment.  We have open lab daily Monday through Thursday from 1:30-4:30 PM and Friday from 1:30-4:00 PM at the office of Dr. Bo Merino, [facility].   Please be advised, all patients with office appointments requiring lab work will take precedents over walk-in lab work.  If possible, please come for your lab work on Monday and Friday afternoons, as you may experience shorter wait times. The office is located at [address], [place], [place], [place] [address] No appointment is necessary.   Labs are drawn by [facility]. Please bring your co-pay at the time of your lab draw.  You may receive a bill from [facility] for your lab work.  If you wish to have your labs drawn at another location, please call the office 24 hours in advance to send orders.  If you have any questions regarding directions or hours of operation,  please call [phone].   As a reminder, please drink plenty of water prior to coming for your lab work. Thanks!  COVID-19 vaccine recommendations:   COVID-19 vaccine is recommended for everyone (unless you are allergic to a vaccine component), even if you are on a medication that suppresses your immune system.   If you are on Methotrexate, Cellcept (mycophenolate), Rinvoq, Morrie Sheldon, and Olumiant- hold the medication for 1 week after each vaccine. Hold Methotrexate for 2 weeks after the single dose COVID-19 vaccine.   If you are on Orencia subcutaneous injection - hold medication one week prior to and one week after the first COVID-19 vaccine dose (only).   The recommendations are that the patients who are on immunosuppressive agents should get first 3 COVID-19 vaccines 1 month apart and then a  fourth dose (booster) 3 months after the third dose.  Do not take Tylenol or any anti-inflammatory medications (NSAIDs) 24 hours prior to the COVID-19 vaccination.   There is no direct evidence about the efficacy of the COVID-19 vaccine in individuals who are on medications that suppress the immune system.   Even if you are fully vaccinated, and you are on any medications that suppress your immune system, please continue to wear a mask, maintain at least six feet social distance and practice hand hygiene.   If you develop a COVID-19 infection, please contact your PCP or our office to determine if you need monoclonal antibody infusion.  The booster vaccine is now available for immunocompromised patients.   Please see the following web sites for updated information.   [email or web address]  Vaccines You are taking a medication(s) that can suppress your immune system.  The following immunizations are recommended: . Flu annually . Covid-19  . Pneumonia (Pneumovax 23 and Prevnar 13 spaced at least 1 year apart) . Shingrix (after age 35)  Please check with your PCP to make sure you are up to date.   Heart Disease Prevention   Your inflammatory disease increases your risk of heart disease which includes heart attack, stroke, atrial fibrillation (irregular heartbeats), high blood pressure, heart failure and atherosclerosis (plaque in the arteries).  It is important to reduce your risk by:   . Keep blood pressure, cholesterol, and blood sugar at healthy levels   . Smoking Cessation   . Maintain a healthy weight  o BMI 20-25   . Eat a healthy diet  o Plenty of fresh fruit, vegetables, and whole grains  o Limit saturated fats, foods high in sodium, and added sugars  o DASH and Mediterranean diet   . Increase physical activity  o Recommend moderate physically activity for 150 minutes per week/ 30 minutes a day for five  days a week These can be broken up into three separate ten-minute sessions during the day.   . Reduce Stress  . Meditation, slow breathing exercises, yoga, coloring books  . Dental visits twice a year

## 2020-07-26 LAB — COMPLETE METABOLIC PANEL WITH GFR
AG Ratio: 1.5 (calc) (ref 1.0–2.5)
ALT: 13 U/L (ref 6–29)
AST: 17 U/L (ref 10–35)
Albumin: 4.1 g/dL (ref 3.6–5.1)
Alkaline phosphatase (APISO): 92 U/L (ref 37–153)
BUN: 16 mg/dL (ref 7–25)
CO2: 29 mmol/L (ref 20–32)
Calcium: 9.8 mg/dL (ref 8.6–10.4)
Chloride: 103 mmol/L (ref 98–110)
Creat: 0.81 mg/dL (ref 0.50–1.05)
GFR, Est African American: 93 mL/min/{1.73_m2} (ref >=60)
GFR, Est Non African American: 81 mL/min/{1.73_m2} (ref >=60)
Globulin: 2.7 g/dL (calc) (ref 1.9–3.7)
Glucose, Bld: 88 mg/dL (ref 65–99)
Potassium: 4.2 mmol/L (ref 3.5–5.3)
Sodium: 141 mmol/L (ref 135–146)
Total Bilirubin: 0.4 mg/dL (ref 0.2–1.2)
Total Protein: 6.8 g/dL (ref 6.1–8.1)

## 2020-07-26 LAB — CBC WITH DIFFERENTIAL/PLATELET
Absolute Monocytes: 468 cells/uL (ref 200–950)
Basophils Absolute: 81 cells/uL (ref 0–200)
Basophils Relative: 0.9 %
Eosinophils Absolute: 252 cells/uL (ref 15–500)
Eosinophils Relative: 2.8 %
HCT: 42.8 % (ref 35.0–45.0)
Hemoglobin: 13.6 g/dL (ref 11.7–15.5)
Lymphs Abs: 3852 cells/uL (ref 850–3900)
MCH: 27.2 pg (ref 27.0–33.0)
MCHC: 31.8 g/dL — ABNORMAL LOW (ref 32.0–36.0)
MCV: 85.6 fL (ref 80.0–100.0)
MPV: 11.5 fL (ref 7.5–12.5)
Monocytes Relative: 5.2 %
Neutro Abs: 4347 cells/uL (ref 1500–7800)
Neutrophils Relative %: 48.3 %
Platelets: 278 10*3/uL (ref 140–400)
RBC: 5 10*6/uL (ref 3.80–5.10)
RDW: 13.6 % (ref 11.0–15.0)
Total Lymphocyte: 42.8 %
WBC: 9 10*3/uL (ref 3.8–10.8)

## 2020-08-20 ENCOUNTER — Telehealth: Payer: Self-pay

## 2020-08-20 NOTE — Telephone Encounter (Signed)
Please advise 

## 2020-08-20 NOTE — Telephone Encounter (Signed)
COVID-19 vaccine   If you are on Orencia IV infusions- time vaccination administration so that the first COVID-19 vaccination will occur four weeks after the infusion and postpone the subsequent infusion by one week.

## 2020-08-20 NOTE — Telephone Encounter (Signed)
Patient called stating she is scheduled to have her 2nd Covid booster on Thursday, 08/23/20.  Patient requested a return call to let her know if she should hold off on her Orencia injection that is due tomorrow 08/21/20.

## 2020-08-20 NOTE — Telephone Encounter (Signed)
Patient advised to hold MTX and Orencia 1 week before and 1 week after vaccination.

## 2020-10-29 ENCOUNTER — Telehealth: Payer: Self-pay

## 2020-10-29 NOTE — Telephone Encounter (Signed)
Submitted a faxed Prior Authorization request to [place] for [facility]. Will update once we receive a response.  Fax# [phone] Phone# [phone]

## 2020-10-30 ENCOUNTER — Other Ambulatory Visit: Payer: Self-pay | Admitting: *Deleted

## 2020-10-30 DIAGNOSIS — Z79899 Other long term (current) drug therapy: Secondary | ICD-10-CM

## 2020-10-30 DIAGNOSIS — M0579 Rheumatoid arthritis with rheumatoid factor of multiple sites without organ or systems involvement: Secondary | ICD-10-CM

## 2020-10-30 LAB — CBC WITH DIFFERENTIAL/PLATELET
Absolute Monocytes: 356 cells/uL (ref 200–950)
Basophils Absolute: 73 cells/uL (ref 0–200)
Basophils Relative: 1.1 %
Eosinophils Absolute: 238 cells/uL (ref 15–500)
Eosinophils Relative: 3.6 %
HCT: 44 % (ref 35.0–45.0)
Hemoglobin: 14.3 g/dL (ref 11.7–15.5)
Lymphs Abs: 2501 cells/uL (ref 850–3900)
MCH: 28.1 pg (ref 27.0–33.0)
MCHC: 32.5 g/dL (ref 32.0–36.0)
MCV: 86.6 fL (ref 80.0–100.0)
MPV: 11.5 fL (ref 7.5–12.5)
Monocytes Relative: 5.4 %
Neutro Abs: 3432 cells/uL (ref 1500–7800)
Neutrophils Relative %: 52 %
Platelets: 282 10*3/uL (ref 140–400)
RBC: 5.08 10*6/uL (ref 3.80–5.10)
RDW: 13.6 % (ref 11.0–15.0)
Total Lymphocyte: 37.9 %
WBC: 6.6 10*3/uL (ref 3.8–10.8)

## 2020-10-30 LAB — COMPLETE METABOLIC PANEL WITH GFR
AG Ratio: 1.3 (calc) (ref 1.0–2.5)
ALT: 11 U/L (ref 6–29)
AST: 15 U/L (ref 10–35)
Albumin: 4.1 g/dL (ref 3.6–5.1)
Alkaline phosphatase (APISO): 92 U/L (ref 37–153)
BUN: 14 mg/dL (ref 7–25)
CO2: 29 mmol/L (ref 20–32)
Calcium: 9.5 mg/dL (ref 8.6–10.4)
Chloride: 104 mmol/L (ref 98–110)
Creat: 0.8 mg/dL (ref 0.50–1.03)
Globulin: 3.1 g/dL (calc) (ref 1.9–3.7)
Glucose, Bld: 91 mg/dL (ref 65–99)
Potassium: 4.6 mmol/L (ref 3.5–5.3)
Sodium: 139 mmol/L (ref 135–146)
Total Bilirubin: 0.5 mg/dL (ref 0.2–1.2)
Total Protein: 7.2 g/dL (ref 6.1–8.1)
eGFR: 86 mL/min/{1.73_m2} (ref >=60)

## 2020-10-30 NOTE — Telephone Encounter (Signed)
Received notification from [facility] regarding a prior authorization for [facility]. Authorization has been APPROVED from 10/29/20 to 10/29/21.   Patient can continue to fill through [facility]: [phone]  Authorization # [id number]  Knox Saliva, PharmD, MPH, BCPS Clinical Pharmacist (Rheumatology and Pulmonology)

## 2020-11-05 ENCOUNTER — Telehealth: Payer: Self-pay | Admitting: Rheumatology

## 2020-11-05 NOTE — Telephone Encounter (Signed)
Efaxed last office visit notes 07/25/20 thru 12/30/2018 to Dr. Kelton Pillar PCP, per patient request.

## 2020-11-08 ENCOUNTER — Other Ambulatory Visit: Payer: Self-pay | Admitting: *Deleted

## 2020-11-08 MED ORDER — METHOTREXATE 2.5 MG PO TABS: ORAL_TABLET | ORAL | 0 refills | Status: AC

## 2020-11-08 NOTE — Telephone Encounter (Signed)
Refill request received via fax  Next Visit: 12/26/2020  Last Visit: 07/25/2020  Last Fill: 07/25/2020  DX: Rheumatoid arthritis involving multiple sites with positive rheumatoid factor  Current Dose per office note 07/25/2020: methotrexate 7 tablets by mouth once weekly  Labs: 10/30/2020 CBC and CMP WNL  Okay to refill MTX?

## 2020-11-13 ENCOUNTER — Other Ambulatory Visit: Payer: Self-pay | Admitting: Physician Assistant

## 2020-11-13 NOTE — Telephone Encounter (Signed)
Next Visit: 12/26/2020   Last Visit: 07/25/2020   Last Fill: 05/15/2020   DX: Rheumatoid arthritis involving multiple sites with positive rheumatoid factor   Current Dose per office note 07/25/2020:  Labs: 10/30/2020 CBC and CMP WNL  TB Gold: 04/18/2020 Neg   Okay to refill Orencia?

## 2020-12-26 ENCOUNTER — Ambulatory Visit: Payer: No Typology Code available for payment source | Admitting: Rheumatology

## 2021-06-03 ENCOUNTER — Other Ambulatory Visit: Payer: Self-pay | Admitting: Physician Assistant

## 2021-08-12 ENCOUNTER — Other Ambulatory Visit: Payer: Self-pay | Admitting: Physician Assistant

## 2021-08-12 DIAGNOSIS — M79661 Pain in right lower leg: Secondary | ICD-10-CM

## 2021-08-13 ENCOUNTER — Ambulatory Visit
Admission: RE | Admit: 2021-08-13 | Discharge: 2021-08-13 | Disposition: A | Discharge status: Home / Self Care | Payer: No Typology Code available for payment source | Source: Ambulatory Visit | Attending: Physician Assistant | Admitting: Physician Assistant

## 2021-08-13 DIAGNOSIS — M79661 Pain in right lower leg: Secondary | ICD-10-CM

## 2021-11-04 ENCOUNTER — Telehealth: Payer: Self-pay | Admitting: Pharmacy Technician

## 2021-11-04 NOTE — Telephone Encounter (Signed)
Submitted a Prior Authorization request to [facility] for [employer] via CoverMyMeds. Will update once we receive a response.   KEY: [id number]

## 2021-11-11 NOTE — H&P (Signed)
TOTAL KNEE REVISION ADMISSION H&P  Patient is being admitted for left total knee arthroplasty revision.  Subjective:  Chief Complaint: Left knee pain.  HPI: [name] [name], 61 y.o. female presents for pre-operative visit in preparation for their left total knee arthroplasty revision, which is scheduled on 12/04/2021 with Dr. Wynelle Link at [facility]. The patient has had symptoms in the left knee including pain which has impacted their quality of life and ability to do activities of daily living. The patient currently has a diagnosis of failed left total knee arthroplasty and has failed conservative treatments including activity modification. The patient has had previous total knee arthroplasty revision on the left knee. The patient denies an active infection.  Patient Active Problem List   Diagnosis Date Noted   Status post total left knee replacement 02/28/2019   Chronic pain of left knee 02/28/2019   Status post total knee replacement, bilateral 12/01/2016   Status post bilateral foot surgery 12/01/2016   Contracture of right elbow 06/24/2016   Contracture of left elbow 06/24/2016   Rheumatoid arthritis involving multiple sites with positive rheumatoid factor ([place]) 05/27/2016   ANA positive 05/27/2016   Lateral epicondylitis, left elbow 05/27/2016   Trochanteric bursitis of left hip 05/27/2016   High risk medication use 05/27/2016   DJD (degenerative joint disease), cervical 05/27/2016   Osteopenia of neck of left femur 05/27/2016   Primary osteoarthritis of both feet 05/27/2016   S/P TAH-BSO (5/30) 09/04/2012    Past Medical History:  Diagnosis Date   Arthritis    rheumotroid   Complication of anesthesia    "irregular heartbeat" during neck surgery   Fibroid    Rheumatoid arteritis ([place])    age 49   Urinary incontinence     Past Surgical History:  Procedure Laterality Date   ABDOMINAL HYSTERECTOMY N/A 09/03/2012   Procedure: HYSTERECTOMY ABDOMINAL;  Surgeon:  Marvene Staff, MD;  Location: [facility];  Service: Gynecology;  Laterality: N/A;   FOOT SURGERY Bilateral    joint implants, each foot x 2   JOINT REPLACEMENT     KNEE SURGERY Right 2006   fracture knee after replacement, repaired   POSTERIOR FUSION CERVICAL SPINE  2006   REPLACEMENT TOTAL KNEE Bilateral 1989   left 1989; right 1996   SALPINGOOPHORECTOMY Bilateral 09/03/2012   Procedure: SALPINGO OOPHORECTOMY;  Surgeon: Marvene Staff, MD;  Location: [facility];  Service: Gynecology;  Laterality: Bilateral;   TONSILLECTOMY  1978    Prior to Admission medications   Medication Sig Start Date End Date Taking? Authorizing Provider  ORENCIA CLICKJECT 962 MG/ML SOAJ INJECT ONE CLICKJECT PEN UNDER THE SKIN EVERY WEEK. REFRIGERATE. ALLOW TO WARM TO ROOM TEMPERATURE PRIOR TO ADMINISTRATION. 11/13/20   Ofilia Neas, PA-C  acetaminophen (TYLENOL) 500 MG tablet Take 1,000 mg by mouth every 6 (six) hours as needed for pain.    [provider]  Ascorbic Acid (VITAMIN C) 100 MG tablet Take 100 mg by mouth daily. Patient not taking: Reported on 07/25/2020    [provider]  b complex vitamins capsule Take 1 capsule by mouth daily.    [provider]  Calcium Carb-Cholecalciferol (CALCIUM 1000 + D PO) Take by mouth daily.    [provider]  cholecalciferol (VITAMIN D3) 25 MCG (1000 UNIT) tablet Take 1,000 Units by mouth daily.    [provider]  citalopram (CELEXA) 20 MG tablet Take 1 tablet (20 mg total) by mouth daily. Patient taking differently: Take 20 mg  by mouth as needed. 06/03/16   Bo Merino, MD  folic acid (FOLVITE) 1 MG tablet TAKE 2 TABLET BY MOUTH EVERY DAY 02/09/20   Bo Merino, MD  methotrexate (RHEUMATREX) 2.5 MG tablet TAKE 7 TABLETS(17.5 MG TOTAL) BY MOUTH ONCE A WEEK. PROTECT FROM LIGHT 11/08/20   Ofilia Neas, PA-C  Multiple Vitamins-Minerals (ZINC PO) Take by mouth daily.    [provider]  NONFORMULARY OR  COMPOUNDED ITEM Vit E vaginal suppositories 200u/ml.  One pv three times weekly. Patient not taking: Reported on 07/25/2020 05/05/17   Megan Salon, MD  tolterodine (DETROL LA) 4 MG 24 hr capsule Take 4 mg by mouth daily. 12/16/16   [provider]  vitamin E 1000 UNIT capsule Take 1,000 Units by mouth daily.    [provider]    Allergies  Allergen Reactions   Sulfa Antibiotics Shortness Of Breath   Morphine And Related Rash   Penicillins Rash    Social History   Socioeconomic History   Marital status: Divorced    Spouse name: Not on file   Number of children: Not on file   Years of education: Not on file   Highest education level: Not on file  Occupational History   Not on file  Tobacco Use   Smoking status: Never   Smokeless tobacco: Never  Vaping Use   Vaping Use: Never used  Substance and Sexual Activity   Alcohol use: Not Currently   Drug use: No   Sexual activity: Yes    Birth control/protection: Surgical  Other Topics Concern   Not on file  Social History Narrative   Not on file   Social Determinants of Health   Financial Resource Strain: Not on file  Food Insecurity: Not on file  Transportation Needs: Not on file  Physical Activity: Not on file  Stress: Not on file  Social Connections: Not on file  Intimate Partner Violence: Not on file    Tobacco Use: Low Risk  (07/25/2020)   Patient History    Smoking Tobacco Use: Never    Smokeless Tobacco Use: Never    Passive Exposure: Not on file   Social History   Substance and Sexual Activity  Alcohol Use Not Currently    Family History  Problem Relation Age of Onset   Ovarian cancer Mother 41   Lung cancer Father        asbestos   Cancer Brother 62       appendiceal   Seizures Brother    Heart Problems Brother    Colon cancer Maternal Aunt    Breast cancer Paternal Aunt 9   Colon cancer Maternal Grandmother 36   Stroke Paternal Grandmother    Heart disease Paternal  Grandmother    Breast cancer Paternal Aunt 74    Review of Systems  Constitutional:  Negative for chills and fever.  HENT:  Negative for congestion, sore throat and tinnitus.   Eyes:  Negative for double vision, photophobia and pain.  Respiratory:  Negative for cough, shortness of breath and wheezing.   Cardiovascular:  Negative for chest pain, palpitations and orthopnea.  Gastrointestinal:  Negative for heartburn, nausea and vomiting.  Genitourinary:  Negative for dysuria, frequency and urgency.  Musculoskeletal:  Positive for joint pain.  Neurological:  Negative for dizziness, weakness and headaches.    Objective:  Physical Exam: Well nourished and well developed.  General: Alert and oriented x3, cooperative and pleasant, no acute distress.  Head: normocephalic, atraumatic, neck  supple.  Eyes: EOMI.  Musculoskeletal:  Left Knee Exam: No effusion present. No swelling present. The Range of motion is: 5 to 95 degrees. Positive tenderness along the tibia and some tenderness around the patella. Some varus/valgus and AP laxity present throughout the knee.  Calves soft and nontender. Motor function intact in LE. Strength 5/5 LE bilaterally. Neuro: Distal pulses 2+. Sensation to light touch intact in LE.  Imaging Review AP and lateral of the bilateral knees dated 07/04/2021 demonstrate significant osteolysis in the patella and the patellar component appears loose. She has near complete wear through of the tibial polyethylene.  Assessment/Plan:  Failed left total knee arthroplasty  The patient history, physical examination, clinical judgment of the provider and imaging studies are consistent with failed left TKA and total knee arthroplasty revision is deemed medically necessary. The treatment options including medical management, bracing and arthroplasty were discussed at length. The risks and benefits of total knee arthroplasty were presented and reviewed. The risks due to aseptic  loosening, infection, stiffness, patella tracking problems, thromboembolic complications and other imponderables were discussed. The patient acknowledged the explanation, agreed to proceed with the plan and consent was signed. Patient is being admitted for inpatient treatment for surgery, pain control, PT, OT, prophylactic antibiotics, VTE prophylaxis, progressive ambulation and ADLs and discharge planning. The patient is planning to be discharged  home .  Therapy Plans: Outpatient therapy at [facility] Disposition: Home with friend Planned DVT Prophylaxis: Xarelto 10 mg QD DME Needed: None PCP: Kelton Pillar, MD (clearance received) Rheumatologist: Gavin Pound, MD TXA: IV Allergies: Morphine, PCN (rash) Anesthesia Concerns: Tachycardia with general anesthesia BMI: 29.3 Last HgbA1c: Not diabetic.  Pharmacy: [facility])  Other: - Pt told by rheumatologist to not take any ASA with her methotrexate (on higher dose)  - Patient was instructed on what medications to stop prior to surgery. - Follow-up visit in 2 weeks with Dr. Wynelle Link - Begin physical therapy following surgery - Pre-operative lab work as pre-surgical testing - Prescriptions will be provided in hospital at time of discharge  Theresa Duty, PA-C Orthopedic Surgery [facility] [place]

## 2021-11-14 ENCOUNTER — Other Ambulatory Visit (HOSPITAL_COMMUNITY): Payer: Self-pay

## 2021-11-19 NOTE — Patient Instructions (Signed)
SURGICAL WAITING ROOM VISITATION Patients having surgery or a procedure may have no more than 2 support people in the waiting area - these visitors may rotate.   Children under the age of 72 must have an adult with them who is not the patient. If the patient needs to stay at the hospital during part of their recovery, the visitor guidelines for inpatient rooms apply. Pre-op nurse will coordinate an appropriate time for 1 support person to accompany patient in pre-op.  This support person may not rotate.    Please refer to the [facility] website for the visitor guidelines for Inpatients (after your surgery is over and you are in a regular room).      Your procedure is scheduled on: 12-04-21   Report to [facility] Main Entrance    Report to admitting at 12:30 PM   Call this number if you have problems the morning of surgery [phone]   Do not eat food :After Midnight.   After Midnight you may have the following liquids until 12:00 PM DAY OF SURGERY  Water Non-Citrus Juices (without pulp, NO RED) Carbonated Beverages Black Coffee (NO MILK/CREAM OR CREAMERS, sugar ok)  Clear Tea (NO MILK/CREAM OR CREAMERS, sugar ok) regular and decaf                             Plain Jell-O (NO RED)                                           Fruit ices (not with fruit pulp, NO RED)                                     Popsicles (NO RED)                                                               Sports drinks like Gatorade (NO RED)                   The day of surgery:  Drink ONE (1) Pre-Surgery Clear Ensure at 12:00 PM the morning of surgery. Drink in one sitting. Do not sip.  This drink was given to you during your hospital  pre-op appointment visit. Nothing else to drink after completing the Pre-Surgery Clear Ensure          If you have questions, please contact your surgeon's office.   FOLLOW AND ANY ADDITIONAL PRE OP INSTRUCTIONS YOU RECEIVED FROM YOUR SURGEON'S OFFICE!!!      Oral Hygiene is also important to reduce your risk of infection.                                    Remember - BRUSH YOUR TEETH THE MORNING OF SURGERY WITH YOUR REGULAR TOOTHPASTE   Do NOT smoke after Midnight   Take these medicines the morning of surgery with A SIP OF WATER: Tylenol, Citalopram, Tolterodine  You may not have any metal on your body including hair pins, jewelry, and body piercing             Do not wear make-up, lotions, powders, perfumes or deodorant  Do not wear nail polish including gel and S&S, artificial/acrylic nails, or any other type of covering on natural nails including finger and toenails. If you have artificial nails, gel coating, etc. that needs to be removed by a nail salon please have this removed prior to surgery or surgery may need to be canceled/ delayed if the surgeon/ anesthesia feels like they are unable to be safely monitored.   Do not shave  48 hours prior to surgery.    Do not bring valuables to the hospital. [place].   Contacts, dentures or bridgework may not be worn into surgery.   Bring small overnight bag day of surgery.   DO NOT [place]. PHARMACY WILL DISPENSE MEDICATIONS LISTED ON YOUR MEDICATION LIST TO YOU DURING YOUR ADMISSION [place]!  Please read over the following fact sheets you were given: IF YOU HAVE QUESTIONS ABOUT YOUR PRE-OP INSTRUCTIONS PLEASE CALL [facility] - Preparing for Surgery Before surgery, you can play an important role.  Because skin is not sterile, your skin needs to be as free of germs as possible.  You can reduce the number of germs on your skin by washing with CHG (chlorahexidine gluconate) soap before surgery.  CHG is an antiseptic cleaner which kills germs and bonds with the skin to continue killing germs even after washing. Please DO NOT use if you have an allergy to CHG or  antibacterial soaps.  If your skin becomes reddened/irritated stop using the CHG and inform your nurse when you arrive at Short Stay. Do not shave (including legs and underarms) for at least 48 hours prior to the first CHG shower.  You may shave your face/neck.  Please follow these instructions carefully:  1.  Shower with CHG Soap the night before surgery and the  morning of surgery.  2.  If you choose to wash your hair, wash your hair first as usual with your normal  shampoo.  3.  After you shampoo, rinse your hair and body thoroughly to remove the shampoo.                             4.  Use CHG as you would any other liquid soap.  You can apply chg directly to the skin and wash.  Gently with a scrungie or clean washcloth.  5.  Apply the CHG Soap to your body ONLY FROM THE NECK DOWN.   Do   not use on face/ open                           Wound or open sores. Avoid contact with eyes, ears mouth and   genitals (private parts).                       Wash face,  Genitals (private parts) with your normal soap.             6.  Wash thoroughly, paying special attention to the area where your    surgery  will be performed.  7.  Thoroughly rinse your body with warm water  from the neck down.  8.  DO NOT shower/wash with your normal soap after using and rinsing off the CHG Soap.                9.  Pat yourself dry with a clean towel.            10.  Wear clean pajamas.            11.  Place clean sheets on your bed the night of your first shower and do not  sleep with pets. Day of Surgery : Do not apply any lotions/deodorants the morning of surgery.  Please wear clean clothes to the hospital/surgery center.  FAILURE TO FOLLOW THESE INSTRUCTIONS MAY RESULT IN THE CANCELLATION OF YOUR SURGERY  PATIENT SIGNATURE_________________________________  NURSE SIGNATURE__________________________________  ________________________________________________________________________     Adam Phenix  An  incentive spirometer is a tool that can help keep your lungs clear and active. This tool measures how well you are filling your lungs with each breath. Taking long deep breaths may help reverse or decrease the chance of developing breathing (pulmonary) problems (especially infection) following: A long period of time when you are unable to move or be active. BEFORE THE PROCEDURE  If the spirometer includes an indicator to show your best effort, your nurse or respiratory therapist will set it to a desired goal. If possible, sit up straight or lean slightly forward. Try not to slouch. Hold the incentive spirometer in an upright position. INSTRUCTIONS FOR USE  Sit on the edge of your bed if possible, or sit up as far as you can in bed or on a chair. Hold the incentive spirometer in an upright position. Breathe out normally. Place the mouthpiece in your mouth and seal your lips tightly around it. Breathe in slowly and as deeply as possible, raising the piston or the ball toward the top of the column. Hold your breath for 3-5 seconds or for as long as possible. Allow the piston or ball to fall to the bottom of the column. Remove the mouthpiece from your mouth and breathe out normally. Rest for a few seconds and repeat Steps 1 through 7 at least 10 times every 1-2 hours when you are awake. Take your time and take a few normal breaths between deep breaths. The spirometer may include an indicator to show your best effort. Use the indicator as a goal to work toward during each repetition. After each set of 10 deep breaths, practice coughing to be sure your lungs are clear. If you have an incision (the cut made at the time of surgery), support your incision when coughing by placing a pillow or rolled up towels firmly against it. Once you are able to get out of bed, walk around indoors and cough well. You may stop using the incentive spirometer when instructed by your caregiver.  RISKS AND COMPLICATIONS Take  your time so you do not get dizzy or light-headed. If you are in pain, you may need to take or ask for pain medication before doing incentive spirometry. It is harder to take a deep breath if you are having pain. AFTER USE Rest and breathe slowly and easily. It can be helpful to keep track of a log of your progress. Your caregiver can provide you with a simple table to help with this. If you are using the spirometer at home, follow these instructions: [place] IF:  You are having difficultly using the spirometer. You have trouble using the  spirometer as often as instructed. Your pain medication is not giving enough relief while using the spirometer. You develop fever of 100.5 F (38.1 C) or higher. SEEK IMMEDIATE MEDICAL CARE IF:  You cough up bloody sputum that had not been present before. You develop fever of 102 F (38.9 C) or greater. You develop worsening pain at or near the incision site. MAKE SURE YOU:  Understand these instructions. Will watch your condition. Will get help right away if you are not doing well or get worse. Document Released: 08/04/2006 Document Revised: 06/16/2011 Document Reviewed: 10/05/2006 ExitCare Patient Information 2014 [employer], [place].   ________________________________________________________________________  WHAT IS A BLOOD TRANSFUSION? Blood Transfusion Information  A transfusion is the replacement of blood or some of its parts. Blood is made up of multiple cells which provide different functions. Red blood cells carry oxygen and are used for blood loss replacement. White blood cells fight against infection. Platelets control bleeding. Plasma helps clot blood. Other blood products are available for specialized needs, such as hemophilia or other clotting disorders. BEFORE THE TRANSFUSION  Who gives blood for transfusions?  Healthy volunteers who are fully evaluated to make sure their blood is safe. This is blood bank blood. Transfusion  therapy is the safest it has ever been in the practice of medicine. Before blood is taken from a donor, a complete history is taken to make sure that person has no history of diseases nor engages in risky social behavior (examples are intravenous drug use or sexual activity with multiple partners). The donor's travel history is screened to minimize risk of transmitting infections, such as malaria. The donated blood is tested for signs of infectious diseases, such as HIV and hepatitis. The blood is then tested to be sure it is compatible with you in order to minimize the chance of a transfusion reaction. If you or a relative donates blood, this is often done in anticipation of surgery and is not appropriate for emergency situations. It takes many days to process the donated blood. RISKS AND COMPLICATIONS Although transfusion therapy is very safe and saves many lives, the main dangers of transfusion include:  Getting an infectious disease. Developing a transfusion reaction. This is an allergic reaction to something in the blood you were given. Every precaution is taken to prevent this. The decision to have a blood transfusion has been considered carefully by your caregiver before blood is given. Blood is not given unless the benefits outweigh the risks. AFTER THE TRANSFUSION Right after receiving a blood transfusion, you will usually feel much better and more energetic. This is especially true if your red blood cells have gotten low (anemic). The transfusion raises the level of the red blood cells which carry oxygen, and this usually causes an energy increase. The nurse administering the transfusion will monitor you carefully for complications. HOME CARE INSTRUCTIONS  No special instructions are needed after a transfusion. You may find your energy is better. Speak with your caregiver about any limitations on activity for underlying diseases you may have. SEEK MEDICAL CARE IF:  Your condition is not  improving after your transfusion. You develop redness or irritation at the intravenous (IV) site. SEEK IMMEDIATE MEDICAL CARE IF:  Any of the following symptoms occur over the next 12 hours: Shaking chills. You have a temperature by mouth above 102 F (38.9 C), not controlled by medicine. Chest, back, or muscle pain. People around you feel you are not acting correctly or are confused. Shortness of breath or difficulty breathing. Dizziness  and fainting. You get a rash or develop hives. You have a decrease in urine output. Your urine turns a dark color or changes to pink, red, or brown. Any of the following symptoms occur over the next 10 days: You have a temperature by mouth above 102 F (38.9 C), not controlled by medicine. Shortness of breath. Weakness after normal activity. The white part of the eye turns yellow (jaundice). You have a decrease in the amount of urine or are urinating less often. Your urine turns a dark color or changes to pink, red, or brown. Document Released: 03/21/2000 Document Revised: 06/16/2011 Document Reviewed: 11/08/2007 [facility] Patient Information 2014 [place], [place].  _______________________________________________________________________

## 2021-11-20 ENCOUNTER — Other Ambulatory Visit (HOSPITAL_COMMUNITY): Payer: Self-pay

## 2021-11-20 ENCOUNTER — Other Ambulatory Visit: Payer: Self-pay

## 2021-11-20 ENCOUNTER — Encounter (HOSPITAL_COMMUNITY): Payer: Self-pay

## 2021-11-20 ENCOUNTER — Encounter (HOSPITAL_COMMUNITY)
Admission: RE | Admit: 2021-11-20 | Discharge: 2021-11-20 | Disposition: A | Discharge status: Home / Self Care | Payer: No Typology Code available for payment source | Source: Ambulatory Visit | Attending: Orthopedic Surgery | Admitting: Orthopedic Surgery

## 2021-11-20 VITALS — BP 105/64 | HR 62 | Temp 98.1°F | Resp 18 | Ht 62.0 in | Wt 168.0 lb

## 2021-11-20 DIAGNOSIS — I4510 Unspecified right bundle-branch block: Secondary | ICD-10-CM | POA: Insufficient documentation

## 2021-11-20 DIAGNOSIS — Z01818 Encounter for other preprocedural examination: Secondary | ICD-10-CM | POA: Insufficient documentation

## 2021-11-20 HISTORY — DX: Rheumatoid arthritis, unspecified: M06.9

## 2021-11-20 HISTORY — DX: Cardiac murmur, unspecified: R01.1

## 2021-11-20 HISTORY — DX: Depression, unspecified: F32.A

## 2021-11-20 LAB — CBC
HCT: 42.5 % (ref 36.0–46.0)
Hemoglobin: 13.5 g/dL (ref 12.0–15.0)
MCH: 28.8 pg (ref 26.0–34.0)
MCHC: 31.8 g/dL (ref 30.0–36.0)
MCV: 90.6 fL (ref 80.0–100.0)
Platelets: 304 10*3/uL (ref 150–400)
RBC: 4.69 MIL/uL (ref 3.87–5.11)
RDW: 14.5 % (ref 11.5–15.5)
WBC: 7.2 10*3/uL (ref 4.0–10.5)
nRBC: 0 % (ref 0.0–0.2)

## 2021-11-20 LAB — SURGICAL PCR SCREEN
MRSA, PCR: NEGATIVE
Staphylococcus aureus: NEGATIVE

## 2021-11-20 NOTE — Progress Notes (Addendum)
COVID Vaccine Completed:  Yes  Date of COVID positive in last 90 days:  No  PCP - Kelton Pillar, MD (office note on chart)  Cardiologist - Saw cardiologist in 2006 due to "irregular heart rhythm" after neck surgery.  Pt cannot remember physicians name    Chest x-ray - N/A EKG - 11-20-21 Epic Stress Test - N/A ECHO - Unsure Cardiac Cath - N/A Pacemaker/ICD device last checked: Spinal Cord Stimulator:N/A  Bowel Prep - N/A  Sleep Study - N/A CPAP -   Fasting Blood Sugar - N/A Checks Blood Sugar _____ times a day  Blood Thinner Instructions:N/A Aspirin Instructions: Last Dose:  Activity level:   Patient has difficulty climbing stairs due to joint pain.  Able to perform activities of daily living without stopping and without symptoms of chest pain or shortness of breath.  Anesthesia review:  Heart murmur heard in 2006, pt evalauted by Joaquin [place] at PAT appt and no murmur heard.  Patient also stated that she had an irregular heart rhythm after neck surgery in 2006.  Incomplete RBBB on EKG at PAT  Patient denies shortness of breath, fever, cough and chest pain at PAT appointment  Patient verbalized understanding of instructions that were given to them at the PAT appointment. Patient was also instructed that they will need to review over the PAT instructions again at home before surgery.

## 2021-12-04 ENCOUNTER — Encounter (HOSPITAL_COMMUNITY)
Admission: RE | Disposition: A | Discharge status: Home / Self Care | Payer: Self-pay | Source: Ambulatory Visit | Attending: Orthopedic Surgery

## 2021-12-04 ENCOUNTER — Inpatient Hospital Stay (HOSPITAL_COMMUNITY)
Admission: RE | Admit: 2021-12-04 | Discharge: 2021-12-05 | DRG: 468 | Disposition: A | Discharge status: Home / Self Care | Payer: Medicare Other | Source: Ambulatory Visit | Attending: Orthopedic Surgery | Admitting: Orthopedic Surgery

## 2021-12-04 ENCOUNTER — Inpatient Hospital Stay (HOSPITAL_COMMUNITY): Payer: Medicare Other | Admitting: Physician Assistant

## 2021-12-04 ENCOUNTER — Inpatient Hospital Stay (HOSPITAL_COMMUNITY): Payer: Medicare Other | Admitting: Anesthesiology

## 2021-12-04 ENCOUNTER — Encounter (HOSPITAL_COMMUNITY): Payer: Self-pay | Admitting: Orthopedic Surgery

## 2021-12-04 ENCOUNTER — Other Ambulatory Visit: Payer: Self-pay

## 2021-12-04 DIAGNOSIS — Z885 Allergy status to narcotic agent status: Secondary | ICD-10-CM | POA: Diagnosis not present

## 2021-12-04 DIAGNOSIS — Y792 Prosthetic and other implants, materials and accessory orthopedic devices associated with adverse incidents: Secondary | ICD-10-CM | POA: Diagnosis present

## 2021-12-04 DIAGNOSIS — Z79899 Other long term (current) drug therapy: Secondary | ICD-10-CM | POA: Diagnosis not present

## 2021-12-04 DIAGNOSIS — Z96651 Presence of right artificial knee joint: Secondary | ICD-10-CM | POA: Diagnosis present

## 2021-12-04 DIAGNOSIS — T84093A Other mechanical complication of internal left knee prosthesis, initial encounter: Principal | ICD-10-CM

## 2021-12-04 DIAGNOSIS — G8929 Other chronic pain: Secondary | ICD-10-CM | POA: Diagnosis present

## 2021-12-04 DIAGNOSIS — T84013A Broken internal left knee prosthesis, initial encounter: Secondary | ICD-10-CM

## 2021-12-04 DIAGNOSIS — Z01818 Encounter for other preprocedural examination: Secondary | ICD-10-CM

## 2021-12-04 DIAGNOSIS — Z880 Allergy status to penicillin: Secondary | ICD-10-CM | POA: Diagnosis not present

## 2021-12-04 DIAGNOSIS — Z882 Allergy status to sulfonamides status: Secondary | ICD-10-CM

## 2021-12-04 DIAGNOSIS — M0579 Rheumatoid arthritis with rheumatoid factor of multiple sites without organ or systems involvement: Secondary | ICD-10-CM | POA: Diagnosis present

## 2021-12-04 DIAGNOSIS — T84018A Broken internal joint prosthesis, other site, initial encounter: Principal | ICD-10-CM

## 2021-12-04 HISTORY — PX: TOTAL KNEE REVISION: SHX996

## 2021-12-04 LAB — TYPE AND SCREEN
ABO/RH(D): O POS
Antibody Screen: NEGATIVE

## 2021-12-04 LAB — ABO/RH: ABO/RH(D): O POS

## 2021-12-04 SURGERY — TOTAL KNEE REVISION
Anesthesia: Spinal | Site: Knee | Laterality: Left

## 2021-12-04 MED ORDER — MIDAZOLAM HCL 2 MG/2ML IJ SOLN: 2.0000 mg | Freq: Once | INTRAMUSCULAR | Status: DC

## 2021-12-04 MED ORDER — DEXAMETHASONE SODIUM PHOSPHATE 10 MG/ML IJ SOLN
8.0000 mg | Freq: Once | INTRAMUSCULAR | Status: AC
  Administered 2021-12-04: 8 mg via INTRAVENOUS

## 2021-12-04 MED ORDER — FENTANYL CITRATE PF 50 MCG/ML IJ SOSY
100.0000 ug | PREFILLED_SYRINGE | Freq: Once | INTRAMUSCULAR | Status: AC
  Administered 2021-12-04: 50 ug via INTRAVENOUS

## 2021-12-04 MED ORDER — ONDANSETRON HCL 4 MG PO TABS: 4.0000 mg | ORAL_TABLET | Freq: Four times a day (QID) | ORAL | Status: DC | PRN

## 2021-12-04 MED ORDER — MENTHOL 3 MG MT LOZG: 1.0000 | LOZENGE | OROMUCOSAL | Status: DC | PRN

## 2021-12-04 MED ORDER — FENTANYL CITRATE PF 50 MCG/ML IJ SOSY
PREFILLED_SYRINGE | INTRAMUSCULAR | Status: AC
  Filled 2021-12-04: qty 2

## 2021-12-04 MED ORDER — METOCLOPRAMIDE HCL 5 MG/ML IJ SOLN: 5.0000 mg | Freq: Three times a day (TID) | INTRAMUSCULAR | Status: DC | PRN

## 2021-12-04 MED ORDER — SODIUM CHLORIDE 0.9 % IV SOLN
INTRAVENOUS | Status: DC | PRN
  Administered 2021-12-04: 80 mL

## 2021-12-04 MED ORDER — DIPHENHYDRAMINE HCL 12.5 MG/5ML PO ELIX: 12.5000 mg | ORAL_SOLUTION | ORAL | Status: DC | PRN

## 2021-12-04 MED ORDER — LACTATED RINGERS IV SOLN: INTRAVENOUS | Status: DC

## 2021-12-04 MED ORDER — SODIUM CHLORIDE (PF) 0.9 % IJ SOLN
INTRAMUSCULAR | Status: AC
  Filled 2021-12-04: qty 10

## 2021-12-04 MED ORDER — SODIUM CHLORIDE 0.9 % IV SOLN: INTRAVENOUS | Status: DC

## 2021-12-04 MED ORDER — ONDANSETRON HCL 4 MG/2ML IJ SOLN
INTRAMUSCULAR | Status: DC | PRN
  Administered 2021-12-04: 4 mg via INTRAVENOUS

## 2021-12-04 MED ORDER — PHENOL 1.4 % MT LIQD: 1.0000 | OROMUCOSAL | Status: DC | PRN

## 2021-12-04 MED ORDER — PROMETHAZINE HCL 25 MG/ML IJ SOLN: 6.2500 mg | INTRAMUSCULAR | Status: DC | PRN

## 2021-12-04 MED ORDER — ACETAMINOPHEN 500 MG PO TABS
1000.0000 mg | ORAL_TABLET | Freq: Four times a day (QID) | ORAL | Status: DC
  Administered 2021-12-05 (×3): 1000 mg via ORAL
  Filled 2021-12-04 (×4): qty 2

## 2021-12-04 MED ORDER — FESOTERODINE FUMARATE ER 4 MG PO TB24
4.0000 mg | ORAL_TABLET | Freq: Every day | ORAL | Status: DC
  Administered 2021-12-05: 4 mg via ORAL
  Filled 2021-12-04: qty 1

## 2021-12-04 MED ORDER — PROPOFOL 500 MG/50ML IV EMUL
INTRAVENOUS | Status: DC | PRN
  Administered 2021-12-04: 80 ug/kg/min via INTRAVENOUS

## 2021-12-04 MED ORDER — BUPIVACAINE IN DEXTROSE 0.75-8.25 % IT SOLN
INTRATHECAL | Status: DC | PRN
  Administered 2021-12-04: 1.8 mL via INTRATHECAL

## 2021-12-04 MED ORDER — GABAPENTIN 300 MG PO CAPS
300.0000 mg | ORAL_CAPSULE | Freq: Three times a day (TID) | ORAL | Status: DC
  Administered 2021-12-04 – 2021-12-05 (×3): 300 mg via ORAL
  Filled 2021-12-04 (×3): qty 1

## 2021-12-04 MED ORDER — ACETAMINOPHEN 10 MG/ML IV SOLN
1000.0000 mg | Freq: Four times a day (QID) | INTRAVENOUS | Status: DC
  Administered 2021-12-04: 1000 mg via INTRAVENOUS
  Filled 2021-12-04: qty 100

## 2021-12-04 MED ORDER — ROPIVACAINE HCL 5 MG/ML IJ SOLN
INTRAMUSCULAR | Status: DC | PRN
  Administered 2021-12-04: 20 mL via PERINEURAL

## 2021-12-04 MED ORDER — BUPIVACAINE LIPOSOME 1.3 % IJ SUSP: 20.0000 mL | Freq: Once | INTRAMUSCULAR | Status: DC

## 2021-12-04 MED ORDER — ORAL CARE MOUTH RINSE: 15.0000 mL | Freq: Once | OROMUCOSAL | Status: AC

## 2021-12-04 MED ORDER — ONDANSETRON HCL 4 MG/2ML IJ SOLN: 4.0000 mg | Freq: Four times a day (QID) | INTRAMUSCULAR | Status: DC | PRN

## 2021-12-04 MED ORDER — HYDROMORPHONE HCL 1 MG/ML IJ SOLN: 0.5000 mg | INTRAMUSCULAR | Status: DC | PRN

## 2021-12-04 MED ORDER — BUPIVACAINE LIPOSOME 1.3 % IJ SUSP
INTRAMUSCULAR | Status: AC
  Filled 2021-12-04: qty 20

## 2021-12-04 MED ORDER — POVIDONE-IODINE 10 % EX SWAB: 2.0000 | Freq: Once | CUTANEOUS | Status: DC

## 2021-12-04 MED ORDER — FENTANYL CITRATE PF 50 MCG/ML IJ SOSY: 25.0000 ug | PREFILLED_SYRINGE | INTRAMUSCULAR | Status: DC | PRN

## 2021-12-04 MED ORDER — METOCLOPRAMIDE HCL 5 MG PO TABS: 5.0000 mg | ORAL_TABLET | Freq: Three times a day (TID) | ORAL | Status: DC | PRN

## 2021-12-04 MED ORDER — PHENYLEPHRINE HCL-NACL 20-0.9 MG/250ML-% IV SOLN
INTRAVENOUS | Status: DC | PRN
  Administered 2021-12-04: 30 ug/min via INTRAVENOUS

## 2021-12-04 MED ORDER — SODIUM CHLORIDE 0.9 % IR SOLN
Status: DC | PRN
  Administered 2021-12-04: 1000 mL

## 2021-12-04 MED ORDER — RIVAROXABAN 10 MG PO TABS
10.0000 mg | ORAL_TABLET | Freq: Every day | ORAL | Status: DC
  Administered 2021-12-05: 10 mg via ORAL
  Filled 2021-12-04: qty 1

## 2021-12-04 MED ORDER — TRAMADOL HCL 50 MG PO TABS: 50.0000 mg | ORAL_TABLET | Freq: Four times a day (QID) | ORAL | Status: DC | PRN

## 2021-12-04 MED ORDER — PROPOFOL 10 MG/ML IV BOLUS
INTRAVENOUS | Status: DC | PRN
  Administered 2021-12-04: 30 mg via INTRAVENOUS
  Administered 2021-12-04: 10 mg via INTRAVENOUS

## 2021-12-04 MED ORDER — FLEET ENEMA 7-19 GM/118ML RE ENEM: 1.0000 | ENEMA | Freq: Once | RECTAL | Status: DC | PRN

## 2021-12-04 MED ORDER — BISACODYL 10 MG RE SUPP: 10.0000 mg | Freq: Every day | RECTAL | Status: DC | PRN

## 2021-12-04 MED ORDER — CEFAZOLIN SODIUM-DEXTROSE 2-4 GM/100ML-% IV SOLN
2.0000 g | INTRAVENOUS | Status: AC
  Administered 2021-12-04: 2 g via INTRAVENOUS
  Filled 2021-12-04: qty 100

## 2021-12-04 MED ORDER — OXYCODONE HCL 5 MG PO TABS
5.0000 mg | ORAL_TABLET | ORAL | Status: DC | PRN
  Administered 2021-12-04 (×2): 10 mg via ORAL
  Administered 2021-12-05 (×2): 5 mg via ORAL
  Filled 2021-12-04 (×2): qty 1
  Filled 2021-12-04 (×2): qty 2

## 2021-12-04 MED ORDER — TRANEXAMIC ACID-NACL 1000-0.7 MG/100ML-% IV SOLN
1000.0000 mg | INTRAVENOUS | Status: AC
  Administered 2021-12-04: 1000 mg via INTRAVENOUS
  Filled 2021-12-04: qty 100

## 2021-12-04 MED ORDER — MIDAZOLAM HCL 2 MG/2ML IJ SOLN
INTRAMUSCULAR | Status: AC
  Filled 2021-12-04: qty 2

## 2021-12-04 MED ORDER — DEXAMETHASONE SODIUM PHOSPHATE 10 MG/ML IJ SOLN
10.0000 mg | Freq: Once | INTRAMUSCULAR | Status: AC
  Administered 2021-12-05: 10 mg via INTRAVENOUS
  Filled 2021-12-04: qty 1

## 2021-12-04 MED ORDER — METHOCARBAMOL 500 MG PO TABS
500.0000 mg | ORAL_TABLET | Freq: Four times a day (QID) | ORAL | Status: DC | PRN
  Administered 2021-12-04 – 2021-12-05 (×2): 500 mg via ORAL
  Filled 2021-12-04 (×2): qty 1

## 2021-12-04 MED ORDER — CEFAZOLIN SODIUM-DEXTROSE 2-4 GM/100ML-% IV SOLN
2.0000 g | Freq: Four times a day (QID) | INTRAVENOUS | Status: AC
  Administered 2021-12-04 – 2021-12-05 (×2): 2 g via INTRAVENOUS
  Filled 2021-12-04 (×2): qty 100

## 2021-12-04 MED ORDER — POLYETHYLENE GLYCOL 3350 17 G PO PACK: 17.0000 g | PACK | Freq: Every day | ORAL | Status: DC | PRN

## 2021-12-04 MED ORDER — CHLORHEXIDINE GLUCONATE 0.12 % MT SOLN
15.0000 mL | Freq: Once | OROMUCOSAL | Status: AC
  Administered 2021-12-04: 15 mL via OROMUCOSAL

## 2021-12-04 MED ORDER — 0.9 % SODIUM CHLORIDE (POUR BTL) OPTIME
TOPICAL | Status: DC | PRN
  Administered 2021-12-04: 1000 mL

## 2021-12-04 MED ORDER — METHOCARBAMOL 500 MG IVPB - SIMPLE MED: 500.0000 mg | Freq: Four times a day (QID) | INTRAVENOUS | Status: DC | PRN

## 2021-12-04 MED ORDER — SODIUM CHLORIDE (PF) 0.9 % IJ SOLN
INTRAMUSCULAR | Status: AC
Start: 2021-12-04 — End: ?
  Filled 2021-12-04: qty 50

## 2021-12-04 MED ORDER — DOCUSATE SODIUM 100 MG PO CAPS
100.0000 mg | ORAL_CAPSULE | Freq: Two times a day (BID) | ORAL | Status: DC
  Administered 2021-12-04 – 2021-12-05 (×2): 100 mg via ORAL
  Filled 2021-12-04 (×2): qty 1

## 2021-12-04 SURGICAL SUPPLY — 85 items
ADAPTER BOLT FEMORAL +2/-2 (Knees) IMPLANT
ADPR FEM +2/-2 OFST BOLT (Knees) ×1 IMPLANT
ADPR FEM 5D STRL KN PFC SGM (Orthopedic Implant) ×1 IMPLANT
AUG FEM SZ2 8 CMB POST STRL LF (Orthopedic Implant) ×2 IMPLANT
AUG FEM SZ2 8 STRL LF KN LT TI (Orthopedic Implant) ×1 IMPLANT
AUG TIB SZ2 10 REV STP WDG (Knees) ×2 IMPLANT
AUGMENT DISTAL AUG 2/8 (Orthopedic Implant) IMPLANT
AUGMENT PFC SIG DIS 4MM SZ 2.8 (Orthopedic Implant) IMPLANT
BAG COUNTER SPONGE SURGICOUNT (BAG) IMPLANT
BAG DECANTER FOR FLEXI CONT (MISCELLANEOUS) ×2 IMPLANT
BAG SPEC THK2 15X12 ZIP CLS (MISCELLANEOUS)
BAG SPNG CNTER NS LX DISP (BAG)
BAG ZIPLOCK 12X15 (MISCELLANEOUS) IMPLANT
BLADE SAG 18X100X1.27 (BLADE) ×2 IMPLANT
BLADE SAW SGTL 11.0X1.19X90.0M (BLADE) ×2 IMPLANT
BLADE SURG SZ10 CARB STEEL (BLADE) IMPLANT
BNDG ELASTIC 6X5.8 VLCR STR LF (GAUZE/BANDAGES/DRESSINGS) ×2 IMPLANT
BONE CEMENT GENTAMICIN (Cement) ×3 IMPLANT
CEMENT BONE GENTAMICIN 40 (Cement) ×6 IMPLANT
CEMENT RESTRICTOR DEPUY SZ 3 (Cement) IMPLANT
COVER SURGICAL LIGHT HANDLE (MISCELLANEOUS) ×2 IMPLANT
CUFF TOURN SGL QUICK 34 (TOURNIQUET CUFF) ×1
CUFF TRNQT CYL 34X4.125X (TOURNIQUET CUFF) ×2 IMPLANT
DISTAL AUG 2/8 (Orthopedic Implant) ×1 IMPLANT
DRAPE INCISE IOBAN 66X45 STRL (DRAPES) ×2 IMPLANT
DRAPE U-SHAPE 47X51 STRL (DRAPES) ×2 IMPLANT
DRSG ADAPTIC 3X8 NADH LF (GAUZE/BANDAGES/DRESSINGS) ×2 IMPLANT
DRSG AQUACEL AG ADV 3.5X10 (GAUZE/BANDAGES/DRESSINGS) IMPLANT
DURAPREP 26ML APPLICATOR (WOUND CARE) ×2 IMPLANT
ELECT REM PT RETURN 15FT ADLT (MISCELLANEOUS) ×2 IMPLANT
EVACUATOR 1/8 PVC DRAIN (DRAIN) IMPLANT
FEM TC3 PFC SZ2 LEFT (Orthopedic Implant) ×1 IMPLANT
FEMORAL ADAPTER (Orthopedic Implant) IMPLANT
FEMORAL TC3 PFC SZ2 LEFT (Orthopedic Implant) IMPLANT
GAUZE PAD ABD 8X10 STRL (GAUZE/BANDAGES/DRESSINGS) ×2 IMPLANT
GAUZE SPONGE 4X4 12PLY STRL (GAUZE/BANDAGES/DRESSINGS) ×2 IMPLANT
GLOVE BIO SURGEON STRL SZ 6.5 (GLOVE) ×4 IMPLANT
GLOVE BIO SURGEON STRL SZ7 (GLOVE) ×2 IMPLANT
GLOVE BIO SURGEON STRL SZ8 (GLOVE) ×4 IMPLANT
GLOVE BIOGEL PI IND STRL 7.0 (GLOVE) ×2 IMPLANT
GLOVE BIOGEL PI IND STRL 8 (GLOVE) ×2 IMPLANT
GLOVE BIOGEL PI IND STRL 8.5 (GLOVE) IMPLANT
GLOVE BIOGEL PI INDICATOR 7.0 (GLOVE) ×1
GLOVE BIOGEL PI INDICATOR 8 (GLOVE) ×1
GLOVE BIOGEL PI INDICATOR 8.5 (GLOVE)
GOWN SPEC L4 XLG W/TWL (GOWN DISPOSABLE) ×4 IMPLANT
GOWN STRL REUS W/ TWL LRG LVL3 (GOWN DISPOSABLE) ×2 IMPLANT
GOWN STRL REUS W/ TWL XL LVL3 (GOWN DISPOSABLE) IMPLANT
GOWN STRL REUS W/TWL LRG LVL3 (GOWN DISPOSABLE) ×1
GOWN STRL REUS W/TWL XL LVL3 (GOWN DISPOSABLE)
HANDPIECE INTERPULSE COAX TIP (DISPOSABLE) ×1
HOLDER FOLEY CATH W/STRAP (MISCELLANEOUS) IMPLANT
IMMOBILIZER KNEE 20 (SOFTGOODS) ×1
IMMOBILIZER KNEE 20 THIGH 36 (SOFTGOODS) ×2 IMPLANT
INSERT TIBIAL 2 25MM TC3 RP (Insert) IMPLANT
KIT TURNOVER KIT A (KITS) IMPLANT
MANIFOLD NEPTUNE II (INSTRUMENTS) ×2 IMPLANT
NS IRRIG 1000ML POUR BTL (IV SOLUTION) ×2 IMPLANT
PACK TOTAL KNEE CUSTOM (KITS) ×2 IMPLANT
PADDING CAST ABS COTTON 6X4 NS (CAST SUPPLIES) IMPLANT
PADDING CAST COTTON 6X4 STRL (CAST SUPPLIES) ×4 IMPLANT
PFC SIGMA DIS AUG 4MM SZ 2.8 (Orthopedic Implant) ×2 IMPLANT
PROTECTOR NERVE ULNAR (MISCELLANEOUS) ×2 IMPLANT
SET HNDPC FAN SPRY TIP SCT (DISPOSABLE) ×2 IMPLANT
SPIKE FLUID TRANSFER (MISCELLANEOUS) IMPLANT
STEM TIBIA PFC 13X30MM (Stem) IMPLANT
STEM UNIVERSAL REVISION 75X16 (Stem) IMPLANT
STRIP CLOSURE SKIN 1/2X4 (GAUZE/BANDAGES/DRESSINGS) ×2 IMPLANT
SUT MNCRL AB 4-0 PS2 18 (SUTURE) ×2 IMPLANT
SUT STRATAFIX 0 PDS 27 VIOLET (SUTURE) ×1
SUT VIC AB 2-0 CT1 27 (SUTURE) ×3
SUT VIC AB 2-0 CT1 TAPERPNT 27 (SUTURE) ×6 IMPLANT
SUTURE STRATFX 0 PDS 27 VIOLET (SUTURE) ×2 IMPLANT
SWAB COLLECTION DEVICE MRSA (MISCELLANEOUS) IMPLANT
SWAB CULTURE ESWAB REG 1ML (MISCELLANEOUS) IMPLANT
SYR 50ML LL SCALE MARK (SYRINGE) ×4 IMPLANT
TOWER CARTRIDGE SMART MIX (DISPOSABLE) ×2 IMPLANT
TRAY FOLEY MTR SLVR 16FR STAT (SET/KITS/TRAYS/PACK) ×2 IMPLANT
TRAY SLEEVE CEM ML (Knees) IMPLANT
TRAY TIB SZ 2 REVISION (Knees) IMPLANT
TUBE KAMVAC SUCTION (TUBING) IMPLANT
TUBE SUCTION HIGH CAP CLEAR NV (SUCTIONS) ×2 IMPLANT
WATER STERILE IRR 1000ML POUR (IV SOLUTION) ×2 IMPLANT
WEDGE SZ 2.0MM (Knees) IMPLANT
WRAP KNEE MAXI GEL POST OP (GAUZE/BANDAGES/DRESSINGS) IMPLANT

## 2021-12-04 NOTE — Discharge Instructions (Addendum)
Gaynelle Arabian, MD Total Joint Specialist [facility] [place] [address] [place],  [address] [phone]  POSTOPERATIVE DIRECTIONS  Knee Rehabilitation, Guidelines Following Surgery  Results after knee surgery are often greatly improved when you follow the exercise, range of motion and muscle strengthening exercises prescribed by your doctor. Safety measures are also important to protect the knee from further injury. If any of these exercises cause you to have increased pain or swelling in your knee joint, decrease the amount until you are comfortable again and slowly increase them. If you have problems or questions, call your caregiver or physical therapist for advice.   BLOOD CLOT PREVENTION Take a 10 mg Xarelto once a day for three weeks following surgery.  You may resume your vitamins/supplements once you have discontinued the Xarelto. Do not take any NSAIDs (Advil, Aleve, Ibuprofen, Meloxicam, etc.) until you have discontinued the Xarelto.    HOME CARE INSTRUCTIONS  Remove items at home which could result in a fall. This includes throw rugs or furniture in walking pathways.  ICE to the affected knee as much as tolerated. Icing helps control swelling. If the swelling is well controlled you will be more comfortable and rehab easier. Continue to use ice on the knee for pain and swelling from surgery. You may notice swelling that will progress down to the foot and ankle. This is normal after surgery. Elevate the leg when you are not up walking on it.    Continue to use the breathing machine which will help keep your temperature down. It is common for your temperature to cycle up and down following surgery, especially at night when you are not up moving around and exerting yourself. The breathing machine keeps your lungs expanded and your temperature down. Do not place pillow under the operative knee, focus on keeping the knee straight while  resting  DIET You may resume your previous home diet once you are discharged from the hospital.  DRESSING / WOUND CARE / SHOWERING Keep your bulky bandage on for 2 days. On the third post-operative day you may remove the Ace bandage and gauze. There is a waterproof adhesive bandage on your skin which will stay in place until your first follow-up appointment. Once you remove this you will not need to place another bandage You may begin showering 3 days following surgery, but do not submerge the incision under water.  ACTIVITY For the first 5 days, the key is rest and control of pain and swelling Do your home exercises twice a day starting on post-operative day 3. On the days you go to physical therapy, just do the home exercises once that day. You should rest, ice and elevate the leg for 50 minutes out of every hour. Get up and walk/stretch for 10 minutes per hour. After 5 days you can increase your activity slowly as tolerated. Walk with your walker as instructed. Use the walker until you are comfortable transitioning to a cane. Walk with the cane in the opposite hand of the operative leg. You may discontinue the cane once you are comfortable and walking steadily. Avoid periods of inactivity such as sitting longer than an hour when not asleep. This helps prevent blood clots.  You may discontinue the knee immobilizer once you are able to perform a straight leg raise while lying down. You may resume a sexual relationship in one month or when given the OK by your doctor.  You may return to work once you are cleared by  your doctor.  Do not drive a car for 6 weeks or until released by your surgeon.  Do not drive while taking narcotics.  TED HOSE STOCKINGS Wear the elastic stockings on both legs for three weeks following surgery during the day. You may remove them at night for sleeping.  WEIGHT BEARING Weight bearing as tolerated with assist device (walker, cane, etc) as directed, use it as long  as suggested by your surgeon or therapist, typically at least 4-6 weeks.  POSTOPERATIVE CONSTIPATION PROTOCOL Constipation - defined medically as fewer than three stools per week and severe constipation as less than one stool per week.  One of the most common issues patients have following surgery is constipation.  Even if you have a regular bowel pattern at home, your normal regimen is likely to be disrupted due to multiple reasons following surgery.  Combination of anesthesia, postoperative narcotics, change in appetite and fluid intake all can affect your bowels.  In order to avoid complications following surgery, here are some recommendations in order to help you during your recovery period.  Colace (docusate) - Pick up an over-the-counter form of Colace or another stool softener and take twice a day as long as you are requiring postoperative pain medications.  Take with a full glass of water daily.  If you experience loose stools or diarrhea, hold the colace until you stool forms back up. If your symptoms do not get better within 1 week or if they get worse, check with your doctor. Dulcolax (bisacodyl) - Pick up over-the-counter and take as directed by the product packaging as needed to assist with the movement of your bowels.  Take with a full glass of water.  Use this product as needed if not relieved by Colace only.  MiraLax (polyethylene glycol) - Pick up over-the-counter to have on hand. MiraLax is a solution that will increase the amount of water in your bowels to assist with bowel movements.  Take as directed and can mix with a glass of water, juice, soda, coffee, or tea. Take if you go more than two days without a movement. Do not use MiraLax more than once per day. Call your doctor if you are still constipated or irregular after using this medication for 7 days in a row.  If you continue to have problems with postoperative constipation, please contact the office for further assistance and  recommendations.  If you experience "the worst abdominal pain ever" or develop nausea or vomiting, please contact the office immediatly for further recommendations for treatment.  ITCHING If you experience itching with your medications, try taking only a single pain pill, or even half a pain pill at a time.  You can also use Benadryl over the counter for itching or also to help with sleep.   MEDICATIONS See your medication summary on the "After Visit Summary" that the nursing staff will review with you prior to discharge.  You may have some home medications which will be placed on hold until you complete the course of blood thinner medication.  It is important for you to complete the blood thinner medication as prescribed by your surgeon.  Continue your approved medications as instructed at time of discharge.  PRECAUTIONS If you experience chest pain or shortness of breath - call 911 immediately for transfer to the hospital emergency department.  If you develop a fever greater that 101 F, purulent drainage from wound, increased redness or drainage from wound, foul odor from the wound/dressing, or calf pain -  CONTACT YOUR SURGEON.                                                   FOLLOW-UP APPOINTMENTS Make sure you keep all of your appointments after your operation with your surgeon and caregivers. You should call the office at the above phone number and make an appointment for approximately two weeks after the date of your surgery or on the date instructed by your surgeon outlined in the "After Visit Summary".  RANGE OF MOTION AND STRENGTHENING EXERCISES  Rehabilitation of the knee is important following a knee injury or an operation. After just a few days of immobilization, the muscles of the thigh which control the knee become weakened and shrink (atrophy). Knee exercises are designed to build up the tone and strength of the thigh muscles and to improve knee motion. Often times heat used for  twenty to thirty minutes before working out will loosen up your tissues and help with improving the range of motion but do not use heat for the first two weeks following surgery. These exercises can be done on a training (exercise) mat, on the floor, on a table or on a bed. Use what ever works the best and is most comfortable for you Knee exercises include:  Leg Lifts - While your knee is still immobilized in a splint or cast, you can do straight leg raises. Lift the leg to 60 degrees, hold for 3 sec, and slowly lower the leg. Repeat 10-20 times 2-3 times daily. Perform this exercise against resistance later as your knee gets better.  Quad and Hamstring Sets - Tighten up the muscle on the front of the thigh (Quad) and hold for 5-10 sec. Repeat this 10-20 times hourly. Hamstring sets are done by pushing the foot backward against an object and holding for 5-10 sec. Repeat as with quad sets.  Leg Slides: Lying on your back, slowly slide your foot toward your buttocks, bending your knee up off the floor (only go as far as is comfortable). Then slowly slide your foot back down until your leg is flat on the floor again. Angel Wings: Lying on your back spread your legs to the side as far apart as you can without causing discomfort.  A rehabilitation program following serious knee injuries can speed recovery and prevent re-injury in the future due to weakened muscles. Contact your doctor or a physical therapist for more information on knee rehabilitation.   POST-OPERATIVE OPIOID TAPER INSTRUCTIONS: It is important to wean off of your opioid medication as soon as possible. If you do not need pain medication after your surgery it is ok to stop day one. Opioids include: Codeine, Hydrocodone(Norco, Vicodin), Oxycodone(Percocet, oxycontin) and hydromorphone amongst others.  Long term and even short term use of opiods can cause: Increased pain response Dependence Constipation Depression Respiratory depression And  more.  Withdrawal symptoms can include Flu like symptoms Nausea, vomiting And more Techniques to manage these symptoms Hydrate well Eat regular healthy meals Stay active Use relaxation techniques(deep breathing, meditating, yoga) Do Not substitute Alcohol to help with tapering If you have been on opioids for less than two weeks and do not have pain than it is ok to stop all together.  Plan to wean off of opioids This plan should start within one week post op of your joint replacement. Maintain  the same interval or time between taking each dose and first decrease the dose.  Cut the total daily intake of opioids by one tablet each day Next start to increase the time between doses. The last dose that should be eliminated is the evening dose.   IF YOU ARE TRANSFERRED TO A SKILLED REHAB FACILITY If the patient is transferred to a skilled rehab facility following release from the hospital, a list of the current medications will be sent to the facility for the patient to continue.  When discharged from the skilled rehab facility, please have the facility set up the patient's [place] prior to being released. Also, the skilled facility will be responsible for providing the patient with their medications at time of release from the facility to include their pain medication, the muscle relaxants, and their blood thinner medication. If the patient is still at the rehab facility at time of the two week follow up appointment, the skilled rehab facility will also need to assist the patient in arranging follow up appointment in our office and any transportation needs.  MAKE SURE YOU:  Understand these instructions.  Get help right away if you are not doing well or get worse.   DENTAL ANTIBIOTICS:  In most cases prophylactic antibiotics for Dental procdeures after total joint surgery are not necessary.  Exceptions are as follows:  1. History of prior total joint infection  2.  Severely immunocompromised (Organ Transplant, cancer chemotherapy, Rheumatoid biologic meds such as [place])  3. Poorly controlled diabetes (A1C &gt; 8.0, blood glucose over 200)  If you have one of these conditions, contact your surgeon for an antibiotic prescription, prior to your dental procedure.    Pick up stool softner and laxative for home use following surgery while on pain medications. Do not submerge incision under water. Please use good hand washing techniques while changing dressing each day. May shower starting three days after surgery. Please use a clean towel to pat the incision dry following showers. Continue to use ice for pain and swelling after surgery. Do not use any lotions or creams on the incision until instructed by your surgeon.  ___________________________________________________________________________________________________  Information on my medicine - XARELTO (Rivaroxaban)  This medication education was reviewed with me or my healthcare representative as part of my discharge preparation.  The pharmacist that spoke with me during my hospital stay was:    Why was Xarelto prescribed for you? Xarelto was prescribed for you to reduce the risk of blood clots forming after orthopedic surgery. The medical term for these abnormal blood clots is venous thromboembolism (VTE).  What do you need to know about xarelto ? Take your Xarelto ONCE DAILY at the same time every day. You may take it either with or without food.  If you have difficulty swallowing the tablet whole, you may crush it and mix in applesauce just prior to taking your dose.  Take Xarelto exactly as prescribed by your doctor and DO NOT stop taking Xarelto without talking to the doctor who prescribed the medication.  Stopping without other VTE prevention medication to take the place of Xarelto may increase your risk of developing a clot.  After discharge, you should have regular check-up  appointments with your healthcare provider that is prescribing your Xarelto.    What do you do if you miss a dose? If you miss a dose, take it as soon as you remember on the same day then continue your regularly scheduled once daily regimen the  next day. Do not take two doses of Xarelto on the same day.   Important Safety Information A possible side effect of Xarelto is bleeding. You should call your healthcare provider right away if you experience any of the following: Bleeding from an injury or your nose that does not stop. Unusual colored urine (red or dark brown) or unusual colored stools (red or black). Unusual bruising for unknown reasons. A serious fall or if you hit your head (even if there is no bleeding).  Some medicines may interact with Xarelto and might increase your risk of bleeding while on Xarelto. To help avoid this, consult your healthcare provider or pharmacist prior to using any new prescription or non-prescription medications, including herbals, vitamins, non-steroidal anti-inflammatory drugs (NSAIDs) and supplements.  This website has more information on Xarelto: [email or web address]-benson.[email or web address]

## 2021-12-04 NOTE — Progress Notes (Signed)
Orthopedic Tech Progress Note Patient Details:  [name] [name] [birth year] [id number]  Patient ID: [name] [name], female   DOB: 1960/06/20, 61 y.o.   MRN: [id number]  Kennis Carina 12/04/2021, 8:26 PM Cpm removed

## 2021-12-04 NOTE — Interval H&P Note (Signed)
History and Physical Interval Note:  12/04/2021 12:33 PM  [place]  has presented today for surgery, with the diagnosis of Failed left total knee arthroplasty.  The various methods of treatment have been discussed with the patient and family. After consideration of risks, benefits and other options for treatment, the patient has consented to  Procedure(s): TOTAL KNEE REVISION (Left) as a surgical intervention.  The patient's history has been reviewed, patient examined, no change in status, stable for surgery.  I have reviewed the patient's chart and labs.  Questions were answered to the patient's satisfaction.     Pilar Plate Isiac Breighner

## 2021-12-04 NOTE — Anesthesia Postprocedure Evaluation (Signed)
Anesthesia Post Note  Patient: Soil scientist  Procedure(s) Performed: TOTAL KNEE REVISION (Left: Knee)     Patient location during evaluation: PACU Anesthesia Type: Spinal Level of consciousness: awake, awake and alert and oriented Pain management: pain level controlled Vital Signs Assessment: post-procedure vital signs reviewed and stable Respiratory status: spontaneous breathing, nonlabored ventilation and respiratory function stable Cardiovascular status: blood pressure returned to baseline and stable Postop Assessment: no headache, no backache, spinal receding and no apparent nausea or vomiting Anesthetic complications: no   No notable events documented.  Last Vitals:  Vitals:   12/04/21 1730 12/04/21 1742  BP: (!) 103/59 119/67  Pulse: (!) 56 (!) 56  Resp: 16 16  Temp:  36.4 C  SpO2: 95% 100%    Last Pain:  Vitals:   12/04/21 1715  TempSrc:   PainSc: 0-No pain                 Santa Lighter

## 2021-12-04 NOTE — Progress Notes (Signed)
Orthopedic Tech Progress Note Patient Details:  [name] [name] [birth year] [id number]  Patient ID: [name] [name], female   DOB: May 26, 1960, 61 y.o.   MRN: [id number]  [name] [name] 12/04/2021, 5:16 PM Cpm placed on left leg in pacu

## 2021-12-04 NOTE — Anesthesia Preprocedure Evaluation (Addendum)
Anesthesia Evaluation  Patient identified by MRN, date of birth, ID band Patient awake    Reviewed: Allergy & Precautions, NPO status , Patient's Chart, lab work & pertinent test results  History of Anesthesia Complications (+) history of anesthetic complications  Airway Mallampati: II  TM Distance: >3 FB Neck ROM: Full    Dental  (+) Dental Advisory Given, Caps, Teeth Intact,    Pulmonary neg pulmonary ROS,    Pulmonary exam normal breath sounds clear to auscultation       Cardiovascular negative cardio ROS Normal cardiovascular exam Rhythm:Regular Rate:Normal     Neuro/Psych PSYCHIATRIC DISORDERS Depression POSTERIOR FUSION CERVICAL SPINE    GI/Hepatic negative GI ROS, Neg liver ROS,   Endo/Other  Obesity   Renal/GU negative Renal ROS  Female GU complaint     Musculoskeletal  (+) Arthritis , Rheumatoid disorders,    Abdominal   Peds  Hematology negative hematology ROS (+) Plt 304k   Anesthesia Other Findings Day of surgery medications reviewed with the patient.  Reproductive/Obstetrics                            Anesthesia Physical Anesthesia Plan  ASA: 2  Anesthesia Plan: Spinal   Post-op Pain Management: Regional block* and Tylenol PO (pre-op)*   Induction: Intravenous  PONV Risk Score and Plan: 2 and Midazolam, TIVA, Dexamethasone and Ondansetron  Airway Management Planned: Natural Airway and Simple Face Mask  Additional Equipment:   Intra-op Plan:   Post-operative Plan:   Informed Consent: I have reviewed the patients History and Physical, chart, labs and discussed the procedure including the risks, benefits and alternatives for the proposed anesthesia with the patient or authorized representative who has indicated his/her understanding and acceptance.     Dental advisory given  Plan Discussed with: CRNA  Anesthesia Plan Comments:         Anesthesia  Quick Evaluation

## 2021-12-04 NOTE — Anesthesia Procedure Notes (Signed)
Spinal  Patient location during procedure: OR Start time: 12/04/2021 2:10 PM End time: 12/04/2021 2:13 PM Reason for block: surgical anesthesia Staffing Performed: anesthesiologist  Anesthesiologist: Santa Lighter, MD Performed by: Santa Lighter, MD Authorized by: Santa Lighter, MD   Preanesthetic Checklist Completed: patient identified, IV checked, risks and benefits discussed, surgical consent, monitors and equipment checked, pre-op evaluation and timeout performed Spinal Block Patient position: sitting Prep: DuraPrep and site prepped and draped Patient monitoring: continuous pulse ox and blood pressure Approach: midline Location: L3-4 Injection technique: single-shot Needle Needle type: Pencan  Needle gauge: 24 G Assessment Events: CSF return and second provider Additional Notes Functioning IV was confirmed and monitors were applied. Sterile prep and drape, including hand hygiene, mask and sterile gloves were used. The patient was positioned and the spine was prepped. The skin was anesthetized with lidocaine.  Free flow of clear CSF was obtained prior to injecting local anesthetic into the CSF.  The spinal needle aspirated freely following injection.  The needle was carefully withdrawn.  The patient tolerated the procedure well. Consent was obtained prior to procedure with all questions answered and concerns addressed. Risks including but not limited to bleeding, infection, nerve damage, paralysis, failed block, inadequate analgesia, allergic reaction, high spinal, itching and headache were discussed and the patient wished to proceed.   Hoy Morn, MD

## 2021-12-04 NOTE — Brief Op Note (Signed)
12/04/2021  4:00 PM  PATIENT:  [name] [name]  61 y.o. female  PRE-OPERATIVE DIAGNOSIS:  Failed left total knee arthroplasty  POST-OPERATIVE DIAGNOSIS:  Failed left total knee arthroplasty  PROCEDURE:  Procedure(s): TOTAL KNEE REVISION (Left)  SURGEON:  Surgeon(s) and Role:    Gaynelle Arabian, MD - Primary  PHYSICIAN ASSISTANT:   ASSISTANTS: Theresa Duty, PA-C   ANESTHESIA:   regional and spinal  EBL:  300 mL   BLOOD ADMINISTERED:none  DRAINS: none   LOCAL MEDICATIONS USED:  OTHER Exparel  COUNTS:  YES  TOURNIQUET:   Total Tourniquet Time Documented: Thigh (Left) - 47 minutes Thigh (Left) - 20 minutes Total: Thigh (Left) - 67 minutes   DICTATION: .Other Dictation: Dictation Number [id number]  PLAN OF CARE: Admit to inpatient   PATIENT DISPOSITION:  PACU - hemodynamically stable.

## 2021-12-04 NOTE — Transfer of Care (Signed)
Immediate Anesthesia Transfer of Care Note  Patient: [name] [name]  Procedure(s) Performed: TOTAL KNEE REVISION (Left: Knee)  Patient Location: PACU  Anesthesia Type:General  Level of Consciousness: drowsy  Airway & Oxygen Therapy: Patient Spontanous Breathing and Patient connected to face mask oxygen  Post-op Assessment: Report given to RN and Post -op Vital signs reviewed and stable  Post vital signs: Reviewed and stable  Last Vitals:  Vitals Value Taken Time  BP 102/62 12/04/21 1630  Temp    Pulse 64 12/04/21 1631  Resp 23 12/04/21 1631  SpO2 99 % 12/04/21 1631  Vitals shown include unvalidated device data.  Last Pain:  Vitals:   12/04/21 1300  TempSrc:   PainSc: 0-No pain         Complications: No notable events documented.

## 2021-12-04 NOTE — Anesthesia Procedure Notes (Signed)
Anesthesia Regional Block: Adductor canal block   Pre-Anesthetic Checklist: , timeout performed,  Correct Patient, Correct Site, Correct Laterality,  Correct Procedure, Correct Position, site marked,  Risks and benefits discussed,  Surgical consent,  Pre-op evaluation,  At surgeon's request and post-op pain management  Laterality: Left  Prep: chloraprep       Needles:  Injection technique: Single-shot  Needle Type: Echogenic Needle     Needle Length: 9cm  Needle Gauge: 21     Additional Needles:   Procedures:,,,, ultrasound used (permanent image in chart),,    Narrative:  Start time: 12/04/2021 1:45 PM End time: 12/04/2021 1:52 PM Injection made incrementally with aspirations every 5 mL.  Performed by: Personally  Anesthesiologist: Santa Lighter, MD  Additional Notes: No pain on injection. No increased resistance to injection. Injection made in 5cc increments.  Good needle visualization.  Patient tolerated procedure well.

## 2021-12-05 ENCOUNTER — Other Ambulatory Visit: Payer: Self-pay

## 2021-12-05 ENCOUNTER — Encounter (HOSPITAL_COMMUNITY): Payer: Self-pay | Admitting: Orthopedic Surgery

## 2021-12-05 ENCOUNTER — Other Ambulatory Visit (HOSPITAL_COMMUNITY): Payer: Self-pay

## 2021-12-05 LAB — CBC
HCT: 36.1 % (ref 36.0–46.0)
Hemoglobin: 11.3 g/dL — ABNORMAL LOW (ref 12.0–15.0)
MCH: 28.6 pg (ref 26.0–34.0)
MCHC: 31.3 g/dL (ref 30.0–36.0)
MCV: 91.4 fL (ref 80.0–100.0)
Platelets: 231 10*3/uL (ref 150–400)
RBC: 3.95 MIL/uL (ref 3.87–5.11)
RDW: 13.9 % (ref 11.5–15.5)
WBC: 12 10*3/uL — ABNORMAL HIGH (ref 4.0–10.5)
nRBC: 0 % (ref 0.0–0.2)

## 2021-12-05 LAB — BASIC METABOLIC PANEL
Anion gap: 4 — ABNORMAL LOW (ref 5–15)
BUN: 15 mg/dL (ref 6–20)
CO2: 26 mmol/L (ref 22–32)
Calcium: 8.5 mg/dL — ABNORMAL LOW (ref 8.9–10.3)
Chloride: 106 mmol/L (ref 98–111)
Creatinine, Ser: 0.81 mg/dL (ref 0.44–1.00)
GFR, Estimated: >60 mL/min (ref >60)
Glucose, Bld: 174 mg/dL — ABNORMAL HIGH (ref 70–99)
Potassium: 4.3 mmol/L (ref 3.5–5.1)
Sodium: 136 mmol/L (ref 135–145)

## 2021-12-05 MED ORDER — METHOCARBAMOL 500 MG PO TABS
500.0000 mg | ORAL_TABLET | Freq: Four times a day (QID) | ORAL | 0 refills | Status: DC | PRN
  Filled 2021-12-05: qty 40, 10d supply, fill #0

## 2021-12-05 MED ORDER — TRAMADOL HCL 50 MG PO TABS
50.0000 mg | ORAL_TABLET | Freq: Four times a day (QID) | ORAL | 0 refills | Status: DC | PRN
  Filled 2021-12-05: qty 40, 5d supply, fill #0

## 2021-12-05 MED ORDER — GABAPENTIN 300 MG PO CAPS
ORAL_CAPSULE | ORAL | 0 refills | Status: DC
  Filled 2021-12-05: qty 84, 42d supply, fill #0

## 2021-12-05 MED ORDER — OXYCODONE HCL 5 MG PO TABS
5.0000 mg | ORAL_TABLET | Freq: Four times a day (QID) | ORAL | 0 refills | Status: DC | PRN
  Filled 2021-12-05: qty 42, 6d supply, fill #0

## 2021-12-05 MED ORDER — SODIUM CHLORIDE 0.9 % IV BOLUS
500.0000 mL | Freq: Once | INTRAVENOUS | Status: AC
  Administered 2021-12-05: 500 mL via INTRAVENOUS

## 2021-12-05 MED ORDER — RIVAROXABAN 10 MG PO TABS
10.0000 mg | ORAL_TABLET | Freq: Every day | ORAL | 0 refills | Status: AC
  Filled 2021-12-05: qty 20, 20d supply, fill #0

## 2021-12-05 NOTE — Plan of Care (Signed)
  Problem: Education: Goal: Knowledge of the prescribed therapeutic regimen will improve Outcome: Progressing Goal: Individualized Educational Video(s) Outcome: Progressing   Problem: Activity: Goal: Ability to avoid complications of mobility impairment will improve Outcome: Progressing Goal: Range of joint motion will improve Outcome: Progressing   Problem: Clinical Measurements: Goal: Postoperative complications will be avoided or minimized Outcome: Progressing   Problem: Pain Management: Goal: Pain level will decrease with appropriate interventions Outcome: Progressing   Problem: Skin Integrity: Goal: Will show signs of wound healing Outcome: Progressing   Problem: Education: Goal: Knowledge of General Education information will improve Description: Including pain rating scale, medication(s)/side effects and non-pharmacologic comfort measures Outcome: Progressing   Problem: Health Behavior/Discharge Planning: Goal: Ability to manage health-related needs will improve Outcome: Progressing   Problem: Clinical Measurements: Goal: Ability to maintain clinical measurements within normal limits will improve Outcome: Progressing Goal: Will remain free from infection Outcome: Progressing Goal: Diagnostic test results will improve Outcome: Progressing Goal: Respiratory complications will improve Outcome: Progressing Goal: Cardiovascular complication will be avoided Outcome: Progressing   Problem: Activity: Goal: Risk for activity intolerance will decrease Outcome: Progressing   Problem: Nutrition: Goal: Adequate nutrition will be maintained Outcome: Progressing   Problem: Coping: Goal: Level of anxiety will decrease Outcome: Progressing   Problem: Elimination: Goal: Will not experience complications related to bowel motility Outcome: Progressing Goal: Will not experience complications related to urinary retention Outcome: Progressing   Problem: Pain  Managment: Goal: General experience of comfort will improve Outcome: Progressing

## 2021-12-05 NOTE — Progress Notes (Signed)
   Subjective: 1 Day Post-Op Procedure(s) (LRB): TOTAL KNEE REVISION (Left) Patient reports pain as mild.   Patient seen in rounds by Dr. Wynelle Link. Patient is doing well this morning, has no complaints. Denies chest pain or SOB. Foley catheter removed this AM. No events overnight.  We will begin therapy today.   Objective: Vital signs in last 24 hours: Temp:  [97 F (36.1 C)-97.8 F (36.6 C)] 97.8 F (36.6 C) (08/31 0437) Pulse Rate:  [54-70] 63 (08/31 0437) Resp:  [14-25] 17 (08/31 0437) BP: (91-131)/(47-75) 91/52 (08/31 0437) SpO2:  [92 %-100 %] 94 % (08/31 0437) Weight:  [76 kg] 76 kg (08/30 1300)  Intake/Output from previous day:  Intake/Output Summary (Last 24 hours) at 12/05/2021 0728 Last data filed at 12/05/2021 0615 Gross per 24 hour  Intake 2239.43 ml  Output 2200 ml  Net 39.43 ml     Intake/Output this shift: No intake/output data recorded.  Labs: Recent Labs    12/05/21 0406  HGB 11.3*   Recent Labs    12/05/21 0406  WBC 12.0*  RBC 3.95  HCT 36.1  PLT 231   Recent Labs    12/05/21 0406  NA 136  K 4.3  CL 106  CO2 26  BUN 15  CREATININE 0.81  GLUCOSE 174*  CALCIUM 8.5*   No results for input(s): "LABPT", "INR" in the last 72 hours.  Exam: General - Patient is Alert and Oriented Extremity - Neurologically intact Neurovascular intact Sensation intact distally Dorsiflexion/Plantar flexion intact Dressing - dressing C/D/I Motor Function - intact, moving foot and toes well on exam.   Past Medical History:  Diagnosis Date   Arthritis    rheumotroid   Complication of anesthesia    "irregular heartbeat" during neck surgery   Depression    Fibroid    Heart murmur    2006, not heard since   Rheumatoid arteritis (HCC)    age 61   Rheumatoid arthritis ([place])    Urinary incontinence     Assessment/Plan: 1 Day Post-Op Procedure(s) (LRB): TOTAL KNEE REVISION (Left) Principal Problem:   Failed total knee arthroplasty ([place]) Active  Problems:   Failed total left knee replacement ([place])  Estimated body mass index is 30.65 kg/m as calculated from the following:   Height as of this encounter: '5\' 2"'$  (1.575 m).   Weight as of this encounter: 76 kg. Advance diet Up with therapy  DVT Prophylaxis - Xarelto Weight bearing as tolerated. Begin therapy.  BP soft this AM, 500 mL bolus ordered.  Plan is to go Home after hospital stay. Possible discharge this afternoon if progresses with therapy and doing very well. Will have low threshold for keeping an additional night given that she had an extensive revision.  Scheduled for OPPT at [facility]. Will follow-up in clinic in 2 weeks.  The PDMP database was reviewed today prior to any opioid medications being prescribed to this patient.  Theresa Duty, PA-C Orthopedic Surgery [phone] 12/05/2021, 7:28 AM

## 2021-12-05 NOTE — TOC Transition Note (Signed)
Transition of Care [facility]) - CM/SW Discharge Note  Patient Details  Name: [name] [name] MRN: [id number] Date of Birth: 1960/07/18  [facility] CM/SW Contact:  Sherie Don, LCSW Phone Number: 12/05/2021, 10:25 AM  Clinical Narrative: Patient is expected to discharge home after working with PT. CSW met with patient to confirm discharge plan. Patient will go home with OPPT at [facility]. Patient has a rolling walker at home, so there are no DME needs at this time. TOC signing off.    Final next level of care: OP Rehab Barriers to Discharge: No Barriers Identified  Patient Goals and CMS Choice Patient states their goals for this hospitalization and ongoing recovery are:: Discharge home with OPPT at [facility] Choice offered to / list presented to : NA  Discharge Plan and Services       DME Arranged: N/A DME Agency: NA  Readmission Risk Interventions     No data to display

## 2021-12-05 NOTE — Evaluation (Signed)
Physical Therapy Evaluation Patient Details Name: [name] [name] MRN: [id number] DOB: December 05, 1960 Today's Date: 12/05/2021  History of Present Illness  61 y.o. female admitted 12/04/21 for L TKA revision. PMH: RA, cervical fusion, B elbow contractures.  Clinical Impression  Pt is s/p TKA resulting in the deficits listed below (see PT Problem List). Pt ambulated 43' with RW, no loss of balance. Instructed pt in TKA HEP. Will plan to do stair training this afternoon, I expect she'll be ready to DC home after that.  Pt will benefit from skilled PT to increase their independence and safety with mobility to allow discharge to the venue listed below.         Recommendations for follow up therapy are one component of a multi-disciplinary discharge planning process, led by the attending physician.  Recommendations may be updated based on patient status, additional functional criteria and insurance authorization.  Follow Up Recommendations Follow physician's recommendations for discharge plan and follow up therapies      Assistance Recommended at Discharge Intermittent Supervision/Assistance  Patient can return home with the following  A little help with walking and/or transfers;A little help with bathing/dressing/bathroom;Assist for transportation;Help with stairs or ramp for entrance;Assistance with cooking/housework    Equipment Recommendations None recommended by PT  Recommendations for Other Services       Functional Status Assessment Patient has had a recent decline in their functional status and demonstrates the ability to make significant improvements in function in a reasonable and predictable amount of time.     Precautions / Restrictions Precautions Precautions: Knee;Fall Precaution Booklet Issued: Yes (comment) Precaution Comments: reviewed no pillow under knee Restrictions Weight Bearing Restrictions: No      Mobility  Bed Mobility Overal bed mobility: Modified  Independent             General bed mobility comments: HOB up, used rail, increased time    Transfers Overall transfer level: Needs assistance Equipment used: Rolling walker (2 wheels) Transfers: Sit to/from Stand Sit to Stand: Min guard           General transfer comment: VCs hand placement    Ambulation/Gait Ambulation/Gait assistance: Min guard Gait Distance (Feet): 90 Feet Assistive device: Rolling walker (2 wheels) Gait Pattern/deviations: Step-to pattern, Decreased step length - right, Decreased step length - left Gait velocity: decr     General Gait Details: steady with RW, no loss of balance  Stairs            Wheelchair Mobility    Modified Rankin (Stroke Patients Only)       Balance Overall balance assessment: Modified Independent                                           Pertinent Vitals/Pain Pain Assessment Pain Assessment: 0-10 Pain Score: 5  Pain Location: L knee Pain Descriptors / Indicators: Sore Pain Intervention(s): Limited activity within patient's tolerance, Monitored during session, Premedicated before session, Ice applied    Home Living Family/patient expects to be discharged to:: Private residence Living Arrangements: Alone Available Help at Discharge: Family;Friend(s);Neighbor;Available 24 hours/day Type of Home: House Home Access: Stairs to enter   [employer] of Steps: 2 Alternate Level Stairs-Number of Steps: has stair lift Home Layout: Two level Home Equipment: Conservation officer, nature (2 wheels);BSC/3in1      Prior Function Prior Level of Function : Independent/Modified Independent  Mobility Comments: walked without AD, reports several falls in past 6 months ADLs Comments: independent     Hand Dominance        Extremity/Trunk Assessment   Upper Extremity Assessment Upper Extremity Assessment: Overall WFL for tasks assessed    Lower Extremity Assessment Lower  Extremity Assessment: LLE deficits/detail LLE Deficits / Details: 3/5 SLR, knee AAROM 5-70* LLE Sensation: WNL    Cervical / Trunk Assessment Cervical / Trunk Assessment: Normal  Communication   Communication: No difficulties  Cognition Arousal/Alertness: Awake/alert Behavior During Therapy: WFL for tasks assessed/performed Overall Cognitive Status: Within Functional Limits for tasks assessed                                          General Comments      Exercises Total Joint Exercises Ankle Circles/Pumps: AROM, Both, 10 reps, Supine Quad Sets: AROM, Left, 5 reps, Supine Short Arc Quad: AROM, Left, 5 reps, Supine Heel Slides: AAROM, Left, 5 reps, Supine Hip ABduction/ADduction: AAROM, Left, 5 reps, Supine Straight Leg Raises: AROM, Left, 5 reps, Supine Long Arc Quad: AROM, Left, 5 reps Knee Flexion: AAROM, Left, 10 reps, Supine   Assessment/Plan    PT Assessment Patient needs continued PT services  PT Problem List Decreased activity tolerance;Decreased mobility;Pain       PT Treatment Interventions Gait training;Therapeutic activities;DME instruction;Therapeutic exercise;Patient/family education;Functional mobility training    PT Goals (Current goals can be found in the Care Plan section)  Acute Rehab PT Goals Patient Stated Goal: to walk farther PT Goal Formulation: With patient Time For Goal Achievement: 12/12/21 Potential to Achieve Goals: Good    Frequency 7X/week     Co-evaluation               AM-PAC PT "6 Clicks" Mobility  Outcome Measure Help needed turning from your back to your side while in a flat bed without using bedrails?: A Little Help needed moving from lying on your back to sitting on the side of a flat bed without using bedrails?: A Little Help needed moving to and from a bed to a chair (including a wheelchair)?: A Little Help needed standing up from a chair using your arms (e.g., wheelchair or bedside chair)?: A  Little Help needed to walk in hospital room?: A Little Help needed climbing 3-5 steps with a railing? : A Little 6 Click Score: 18    End of Session Equipment Utilized During Treatment: Gait belt Activity Tolerance: Patient tolerated treatment well Patient left: in chair;with chair alarm set;with family/visitor present;with call bell/phone within reach Nurse Communication: Mobility status PT Visit Diagnosis: Difficulty in walking, not elsewhere classified (R26.2);Pain Pain - Right/Left: Left Pain - part of body: Knee    Time: 7408-1448 PT Time Calculation (min) (ACUTE ONLY): 39 min   Charges:   PT Evaluation $PT Eval Moderate Complexity: 1 Mod PT Treatments $Gait Training: 8-22 mins $Therapeutic Exercise: 8-22 mins       Blondell Reveal Kistler PT 12/05/2021  Acute Rehabilitation Services  Office [phone]

## 2021-12-05 NOTE — Op Note (Signed)
NAME: [name] [name], [name] B. MEDICAL RECORD NO: [id number] ACCOUNT NO: [id number] DATE OF BIRTH: Aug 03, 1960 FACILITY: [facility]ATION: [id number] PHYSICIAN: Dione Plover. Jamin Humphries, MD  Operative Report   DATE OF PROCEDURE: 12/04/2021  PREOPERATIVE DIAGNOSIS:  Failed left total knee arthroplasty.  POSTOPERATIVE DIAGNOSIS:  Failed left total knee arthroplasty.  PROCEDURE:  Left total knee arthroplasty revision.  SURGEON:  Dione Plover. Kendry Pfarr, MD  ASSISTANT:  Theresa Duty, PA-C.  ANESTHESIA:  Adductor canal block and spinal.  ESTIMATED BLOOD LOSS:  300 mL.  DRAINS:  None.  TOURNIQUET TIME:  Up 49 minutes at 300 mmHg, down 8 minutes, up additional 17 minutes at 300 mmHg.  COMPLICATIONS:  None.  CONDITION:  Stable to recovery.  BRIEF CLINICAL NOTE:  The patient is a 61 year old female with severe rheumatoid arthritis, who had a left total knee arthroplasty performed in 1989 at [place].  For the past year or so, she has had worsening pain and instability.  She had a  grossly unstable knee upon examination in the office and had evidence of a patellar loosening.  She had an infection workup, which was negative.  She presents now for total knee arthroplasty revision.  PROCEDURE IN DETAIL:  After successful administration of adductor canal block, then spinal, a tourniquet was placed high on her left thigh and left lower extremity prepped and draped in the usual sterile fashion.  Extremity was wrapped in Esmarch, knee  flexed and the tourniquet inflated to 300 mmHg.  Midline incision is made with a 10 blade through subcutaneous tissue to the level of the extensor mechanism.  Fresh blade was used to make a medial parapatellar arthrotomy.  There was evidence of abundant  amount of metal stained synovial tissue in the joint.  Soft tissue over the proximal medial tibia subperiosteally elevated to the joint line with a knife and into the semimembranosus bursa with a Cobb elevator.  Soft tissue was elevated  laterally with  attention being paid to avoid the patellar tendon on tibial tubercle.  I spent quite a bit of time removing the metal stained tissue from the medial and lateral gutters and suprapatellar area as well as the infrapatellar area.  I was then able to evert  the patella and flex the knee 90 degrees.  The polyethylene had completely worn through to wear on the medial side the metal was contacting.  I removed the tibial polyethylene.  We then subluxed the tibia forward to gain tibial exposure.  Circumferential  retraction was placed.  Using a saw, I disrupted the interface between the tibial component and bone and used osteotomes to remove it.  There was minimal to no bone loss.  We removed the cement from the proximal canal.  I then reamed the tibia up to 13  mm for 13 mm cemented stem.  The extramedullary tibial alignment guide was then placed, referencing proximally at the medial aspect of the tibial tubercle and distally along the second metatarsal axis and tibial crest.  The blocks pinned to remove about  2 mm off the previous resection level.  Tibia was cut with an oscillating saw.  Size 2 for the MBT revision tray was the most appropriate size.  We prepared the proximal tibia and then also did a preparation with the broach for a 29 mm sleeve.  I then  placed a trial size 2 MBT revision tibia with 10 mm augments medial and lateral, a 29 sleeve and a keel punch with a 13 x 30 stem extension.  This fit perfectly.  I removed this and then we placed the size 3 cement restrictor to appropriate depth in a  tibial canal.  We then addressed the femur.  The femoral component was removed by disrupting the interface between the component and bone using an osteotome.  This was removed with minimal to no bone loss.  Again, there was a lot of metal stained debris under the component and that was removed.   There were no segmental defects, but there was a small cavitary defect medially.  We accessed the  femoral canal and reamed up to 16 mm, which had a great press-fit.  We placed the distal alignment guide off the reamer and cut 2 mm medially.  We had to go  up +8 mm laterally to get any bone, thus an 8 mm distal lateral augment was necessary.  Size 2 was also the most appropriate femoral component for the TC3 component.  The size 2 cutting block was placed.  The rotation marked off the epicondylar axis and  confirmed by creating a rectangular flexion gap at 90 degrees with spacer block in place.  It was pinned in this position.  We did not get any bone resection anteriorly.  Posteriorly, I had to go up 8 mm to get bone on both medial and lateral, thus 8 mm  posterior augments were necessary.  The TAP blocks were removed and we placed the intercondylar block and the intercondylar cuts made for the TC3 component.  We then placed the trial components.  On the tibial side, it was a size 2 MBT revision tray with a 13 x 30 stem extension, 29 sleeve and 10 mm augments medial and lateral.  On the femoral side, it is a size 2 TC3 femur with an 8 mm distal lateral augment  and 8 mm posterior augments medial and lateral.  Also, there was a 16 x 75 stem in a +2 position and 5 degrees of valgus.  The trials were placed.  We needed a 22.5 mm spacer to gain excellent stability.  Full extension was achieved with excellent varus  and valgus stability and then there was excellent stability throughout full range of motion.  The patella was just a very thin flimsy shell.  The patellar component was grossly loose and I removed it with my hand.  I removed some of the fibrous tissue  from that shell.  There is definitely not enough bone left to consider placing a patellar component.  We just left the patella alone doing a patellaplasty.  The patella tracked normally with the trial in place.  At this point, we had released the  tourniquet after 49 minutes.  I let the tourniquet down for 8 minutes.  There was minimal bleeding and  that stopped with cautery.  While the tourniquet was down, the components were assembled on the back table.  After 8 minutes, we rewrapped the leg in  Esmarch and we inflated the tourniquet to 300 mmHg.  The trials were then removed and cut bone surfaces were prepared with pulsatile lavage.  Three batches of gentamicin impregnated cement were mixed and we did the tibia side first, which was a cemented  stem and cemented at the level of the proximal tibia.  The components again were MBT revision tray size 2 with 10 mm medial and lateral augments, a 29 sleeve and 13 x 30 stem extension.  It was impacted and extruded cement removed.  On the femoral side,  we cemented  distally with a press-fit stem.  Distally, it was a size 2 TC3 femur with an 8 mm lateral distal augment, 8 mm posterior augments medial and lateral.  There was a 16 x 75 stem in the +2 position and 5 degrees of valgus.  This was impacted  down to bone with all extruded cement removed.  A 22.5 mm trial was placed.  Knee held in full extension with great stability.  When the cement hardened, then the permanent 22.5 mm TC3 rotating platform insert was placed in the tibial tray.  The knee was  reduced with excellent stability throughout full range of motion.  Wound was copiously irrigated with saline solution.  20 mL of Exparel mixed with 60 mL of saline was injected into the extensor mechanism, periosteum of the femur and subcutaneous  tissues.  The wound was again irrigated and the arthrotomy then closed with a running 0 Stratafix suture.  Flexion against gravity was about 110 degrees and the patella tracked normally.  Tourniquet was released, so the second tourniquet time was 17  minutes.  Minor bleeding stopped with cautery.  Subcutaneous was then closed with interrupted 2-0 Vicryl and subcuticular running 4-0 Monocryl.  Incisions cleaned and dried and Steri-Strips and a sterile dressing applied.  She was then placed into a knee  immobilizer,  awakened, and transported to recovery in stable condition.  Note that a surgical assistant was of medical necessity for this procedure.  Assistance necessary for retraction of vital ligaments and neurovascular structures as well as proper positioning of the limb, for safe removal of the old implant and safe and  accurate placement of the new implant.   NIK D: 12/04/2021 4:10:40 pm T: 12/05/2021 12:32:00 am  JOB: [id number]/ [id number]

## 2021-12-05 NOTE — Progress Notes (Signed)
Physical Therapy Treatment Patient Details Name: [name] [name] MRN: [id number] DOB: 01-Oct-1960 Today's Date: 12/05/2021   History of Present Illness 61 y.o. female admitted 12/04/21 for L TKA revision. PMH: RA, cervical fusion, B elbow contractures.    PT Comments    Pt tolerated increased ambulation distance of 120' with RW, no loss of balance. Stair training completed. She is ready to DC home from a PT standpoint.    Recommendations for follow up therapy are one component of a multi-disciplinary discharge planning process, led by the attending physician.  Recommendations may be updated based on patient status, additional functional criteria and insurance authorization.  Follow Up Recommendations  Follow physician's recommendations for discharge plan and follow up therapies     Assistance Recommended at Discharge Intermittent Supervision/Assistance  Patient can return home with the following A little help with walking and/or transfers;A little help with bathing/dressing/bathroom;Assist for transportation;Help with stairs or ramp for entrance;Assistance with cooking/housework   Equipment Recommendations  None recommended by PT    Recommendations for Other Services       Precautions / Restrictions Precautions Precautions: Knee;Fall Precaution Booklet Issued: Yes (comment) Precaution Comments: reviewed no pillow under knee Restrictions Weight Bearing Restrictions: No     Mobility  Bed Mobility Overal bed mobility: Modified Independent             General bed mobility comments: HOB up, used rail    Transfers Overall transfer level: Needs assistance Equipment used: Rolling walker (2 wheels) Transfers: Sit to/from Stand Sit to Stand: Supervision           General transfer comment: VCs hand placement    Ambulation/Gait Ambulation/Gait assistance: Supervision Gait Distance (Feet): 120 Feet Assistive device: Rolling walker (2 wheels) Gait  Pattern/deviations: Step-to pattern, Decreased step length - right, Decreased step length - left Gait velocity: decr     General Gait Details: steady with RW, no loss of balance   Stairs Stairs: Yes   Stair Management: One rail Left, Sideways Number of Stairs: 2 General stair comments: VCs sequencing, no loss of balance   Wheelchair Mobility    Modified Rankin (Stroke Patients Only)       Balance Overall balance assessment: Modified Independent                                          Cognition Arousal/Alertness: Awake/alert Behavior During Therapy: WFL for tasks assessed/performed Overall Cognitive Status: Within Functional Limits for tasks assessed                                          Exercises     General Comments        Pertinent Vitals/Pain Pain Assessment Pain Assessment: 0-10 Pain Score: 5  Pain Location: L knee Pain Descriptors / Indicators: Sore Pain Intervention(s): Limited activity within patient's tolerance, Monitored during session, Premedicated before session, Repositioned    Home Living                          Prior Function            PT Goals (current goals can now be found in the care plan section) Acute Rehab PT Goals Patient Stated Goal: to walk farther PT Goal Formulation: With patient  Time For Goal Achievement: 12/12/21 Potential to Achieve Goals: Good Progress towards PT goals: Progressing toward goals    Frequency    7X/week      PT Plan Current plan remains appropriate    Co-evaluation              AM-PAC PT "6 Clicks" Mobility   Outcome Measure  Help needed turning from your back to your side while in a flat bed without using bedrails?: A Little Help needed moving from lying on your back to sitting on the side of a flat bed without using bedrails?: A Little Help needed moving to and from a bed to a chair (including a wheelchair)?: None Help needed standing  up from a chair using your arms (e.g., wheelchair or bedside chair)?: None Help needed to walk in hospital room?: A Little Help needed climbing 3-5 steps with a railing? : A Little 6 Click Score: 20    End of Session Equipment Utilized During Treatment: Gait belt Activity Tolerance: Patient tolerated treatment well Patient left: in chair;with chair alarm set;with family/visitor present;with call bell/phone within reach Nurse Communication: Mobility status PT Visit Diagnosis: Difficulty in walking, not elsewhere classified (R26.2);Pain Pain - Right/Left: Left Pain - part of body: Knee     Time: 2595-6387 PT Time Calculation (min) (ACUTE ONLY): 16 min  Charges:  $Gait Training: 8-22 mins                    Blondell Reveal Kistler PT 12/05/2021  Acute Rehabilitation Services  Office [phone]

## 2021-12-05 NOTE — Plan of Care (Signed)
Problem: Education: Goal: Knowledge of the prescribed therapeutic regimen will improve 12/05/2021 1515 by Maryelizabeth Rowan, RN Outcome: Adequate for Discharge 12/05/2021 1321 by Maryelizabeth Rowan, RN Outcome: Progressing Goal: Individualized Educational Video(s) 12/05/2021 1515 by Maryelizabeth Rowan, RN Outcome: Adequate for Discharge 12/05/2021 1321 by Maryelizabeth Rowan, RN Outcome: Progressing   Problem: Activity: Goal: Ability to avoid complications of mobility impairment will improve 12/05/2021 1515 by Maryelizabeth Rowan, RN Outcome: Adequate for Discharge 12/05/2021 1321 by Maryelizabeth Rowan, RN Outcome: Progressing Goal: Range of joint motion will improve 12/05/2021 1515 by Maryelizabeth Rowan, RN Outcome: Adequate for Discharge 12/05/2021 1321 by Maryelizabeth Rowan, RN Outcome: Progressing   Problem: Clinical Measurements: Goal: Postoperative complications will be avoided or minimized 12/05/2021 1515 by Maryelizabeth Rowan, RN Outcome: Adequate for Discharge 12/05/2021 1321 by Maryelizabeth Rowan, RN Outcome: Progressing   Problem: Pain Management: Goal: Pain level will decrease with appropriate interventions 12/05/2021 1515 by Maryelizabeth Rowan, RN Outcome: Adequate for Discharge 12/05/2021 1321 by Maryelizabeth Rowan, RN Outcome: Progressing   Problem: Skin Integrity: Goal: Will show signs of wound healing 12/05/2021 1515 by Maryelizabeth Rowan, RN Outcome: Adequate for Discharge 12/05/2021 1321 by Maryelizabeth Rowan, RN Outcome: Progressing   Problem: Education: Goal: Knowledge of General Education information will improve Description: Including pain rating scale, medication(s)/side effects and non-pharmacologic comfort measures 12/05/2021 1515 by Maryelizabeth Rowan, RN Outcome: Adequate for Discharge 12/05/2021 1321 by Maryelizabeth Rowan, RN Outcome: Progressing   Problem: Health Behavior/Discharge Planning: Goal: Ability to manage health-related needs will improve 12/05/2021 1515 by Maryelizabeth Rowan,  RN Outcome: Adequate for Discharge 12/05/2021 1321 by Maryelizabeth Rowan, RN Outcome: Progressing   Problem: Clinical Measurements: Goal: Ability to maintain clinical measurements within normal limits will improve 12/05/2021 1515 by Maryelizabeth Rowan, RN Outcome: Adequate for Discharge 12/05/2021 1321 by Maryelizabeth Rowan, RN Outcome: Progressing Goal: Will remain free from infection 12/05/2021 1515 by Maryelizabeth Rowan, RN Outcome: Adequate for Discharge 12/05/2021 1321 by Maryelizabeth Rowan, RN Outcome: Progressing Goal: Diagnostic test results will improve 12/05/2021 1515 by Maryelizabeth Rowan, RN Outcome: Adequate for Discharge 12/05/2021 1321 by Maryelizabeth Rowan, RN Outcome: Progressing Goal: Respiratory complications will improve 12/05/2021 1515 by Maryelizabeth Rowan, RN Outcome: Adequate for Discharge 12/05/2021 1321 by Maryelizabeth Rowan, RN Outcome: Progressing Goal: Cardiovascular complication will be avoided 12/05/2021 1515 by Maryelizabeth Rowan, RN Outcome: Adequate for Discharge 12/05/2021 1321 by Maryelizabeth Rowan, RN Outcome: Progressing   Problem: Activity: Goal: Risk for activity intolerance will decrease 12/05/2021 1515 by Maryelizabeth Rowan, RN Outcome: Adequate for Discharge 12/05/2021 1321 by Maryelizabeth Rowan, RN Outcome: Progressing   Problem: Nutrition: Goal: Adequate nutrition will be maintained 12/05/2021 1515 by Maryelizabeth Rowan, RN Outcome: Adequate for Discharge 12/05/2021 1321 by Maryelizabeth Rowan, RN Outcome: Progressing   Problem: Coping: Goal: Level of anxiety will decrease 12/05/2021 1515 by Maryelizabeth Rowan, RN Outcome: Adequate for Discharge 12/05/2021 1321 by Maryelizabeth Rowan, RN Outcome: Progressing   Problem: Elimination: Goal: Will not experience complications related to bowel motility 12/05/2021 1515 by Maryelizabeth Rowan, RN Outcome: Adequate for Discharge 12/05/2021 1321 by Maryelizabeth Rowan, RN Outcome: Progressing Goal: Will not experience complications related to urinary  retention 12/05/2021 1515 by Maryelizabeth Rowan, RN Outcome: Adequate for Discharge 12/05/2021 1321 by Maryelizabeth Rowan, RN Outcome: Progressing   Problem: Pain Managment: Goal: General experience of comfort will improve 12/05/2021 1515 by  Maryelizabeth Rowan, RN Outcome: Adequate for Discharge 12/05/2021 1321 by Maryelizabeth Rowan, RN Outcome: Progressing   Problem: Safety: Goal: Ability to remain free from injury will improve Outcome: Adequate for Discharge   Problem: Skin Integrity: Goal: Risk for impaired skin integrity will decrease Outcome: Adequate for Discharge

## 2021-12-11 NOTE — Discharge Summary (Signed)
Patient ID: [name] [name] MRN: [id number] DOB/AGE: 1960-06-17 61 y.o.  Admit date: 12/04/2021 Discharge date: 12/05/2021  Admission Diagnoses:  Principal Problem:   Failed total knee arthroplasty [facility]) Active Problems:   Failed total left knee replacement [facility])   Discharge Diagnoses:  Same  Past Medical History:  Diagnosis Date   Arthritis    rheumotroid   Complication of anesthesia    "irregular heartbeat" during neck surgery   Depression    Fibroid    Heart murmur    2006, not heard since   Rheumatoid arteritis [facility])    age 65   Rheumatoid arthritis ([place])    Urinary incontinence     Surgeries: Procedure(s): TOTAL KNEE REVISION on 12/04/2021   Consultants:   Discharged Condition: Improved  Hospital Course: Isabel Gayle Martinez is an 61 y.o. female who was admitted 12/04/2021 for operative treatment ofFailed total knee arthroplasty ([place]). Patient has severe unremitting pain that affects sleep, daily activities, and work/hobbies. After pre-op clearance the patient was taken to the operating room on 12/04/2021 and underwent  Procedure(s): TOTAL KNEE REVISION.    Patient was given perioperative antibiotics:  Anti-infectives (From admission, onward)    Start     Dose/Rate Route Frequency Ordered Stop   12/04/21 2000  ceFAZolin (ANCEF) IVPB 2g/100 mL premix        2 g 200 mL/hr over 30 Minutes Intravenous Every 6 hours 12/04/21 1749 12/05/21 0228   12/04/21 1245  ceFAZolin (ANCEF) IVPB 2g/100 mL premix        2 g 200 mL/hr over 30 Minutes Intravenous On call to O.R. 12/04/21 1239 12/04/21 1416        Patient was given sequential compression devices, early ambulation, and chemoprophylaxis to prevent DVT.  Patient benefited maximally from hospital stay and there were no complications.    Recent vital signs: No data found.   Recent laboratory studies: No results for input(s): "WBC", "HGB", "HCT", "PLT", "NA", "K", "CL", "CO2", "BUN", "CREATININE", "GLUCOSE",  "INR", "CALCIUM" in the last 72 hours.  Invalid input(s): "PT", "2"   Discharge Medications:   Allergies as of 12/05/2021       Reactions   Sulfa Antibiotics Shortness Of Breath   Morphine And Related Rash   Penicillins Rash   Tolerated Cephalosporin Date: 12/05/21.        Medication List     STOP taking these medications    CALCIUM 600+D PO   cholecalciferol 25 MCG (1000 UNIT) tablet Commonly known as: VITAMIN D3   cyanocobalamin 1000 MCG tablet Commonly known as: VITAMIN R42   folic acid 1 MG tablet Commonly known as: FOLVITE   multivitamin with minerals Tabs tablet       TAKE these medications    acetaminophen 500 MG tablet Commonly known as: TYLENOL Take 1,000 mg by mouth every 6 (six) hours as needed for pain.   citalopram 20 MG tablet Commonly known as: CeleXA Take 1 tablet (20 mg total) by mouth daily. What changed:  when to take this reasons to take this   gabapentin 300 MG capsule Commonly known as: NEURONTIN Take 1 capsule 3 times daily for 2 weeks following surgery-- take 1 capsule twice daily for 2 weeks-- take 1 capsule once daily for 2 weeks-- discontinue.   methocarbamol 500 MG tablet Commonly known as: ROBAXIN Take 1 tablet (500 mg total) by mouth every 6 (six) hours as needed for muscle spasms.   methotrexate 2.5 MG tablet Commonly known as: RHEUMATREX TAKE 7 TABLETS(17.5  MG TOTAL) BY MOUTH ONCE A WEEK. PROTECT FROM LIGHT   NONFORMULARY OR COMPOUNDED ITEM Vit E vaginal suppositories 200u/ml.  One pv three times weekly.   Orencia ClickJect 127 MG/ML Soaj Generic drug: Abatacept INJECT ONE CLICKJECT PEN UNDER THE SKIN EVERY WEEK. REFRIGERATE. ALLOW TO WARM TO ROOM TEMPERATURE PRIOR TO ADMINISTRATION.   oxyCODONE 5 MG immediate release tablet Commonly known as: Oxy IR/ROXICODONE Take 1-2 tablets (5-10 mg total) by mouth every 6 (six) hours as needed for moderate pain or severe pain.   tolterodine 4 MG 24 hr capsule Commonly known  as: DETROL LA Take 4 mg by mouth daily.   traMADol 50 MG tablet Commonly known as: ULTRAM Take 1-2 tablets (50-100 mg total) by mouth every 6 (six) hours as needed for moderate pain.   Xarelto 10 MG Tabs tablet Generic drug: rivaroxaban Take 1 tablet (10 mg total) by mouth daily with breakfast for 20 days.               Discharge Care Instructions  (From admission, onward)           Start     Ordered   12/05/21 0000  Weight bearing as tolerated        12/05/21 0733   12/05/21 0000  Change dressing       Comments: You may remove the bulky bandage (ACE wrap and gauze) two days after surgery. You will have an adhesive waterproof bandage underneath. Leave this in place until your first follow-up appointment.   12/05/21 0733            Diagnostic Studies: No results found.  Disposition: Discharge disposition: 01-Home or Self Care       Discharge Instructions     Call MD / Call 911   Complete by: As directed    If you experience chest pain or shortness of breath, CALL 911 and be transported to the hospital emergency room.  If you develope a fever above 101 F, pus (white drainage) or increased drainage or redness at the wound, or calf pain, call your surgeon's office.   Change dressing   Complete by: As directed    You may remove the bulky bandage (ACE wrap and gauze) two days after surgery. You will have an adhesive waterproof bandage underneath. Leave this in place until your first follow-up appointment.   Constipation Prevention   Complete by: As directed    Drink plenty of fluids.  Prune juice may be helpful.  You may use a stool softener, such as Colace (over the counter) 100 mg twice a day.  Use MiraLax (over the counter) for constipation as needed.   Diet - low sodium heart healthy   Complete by: As directed    Do not put a pillow under the knee. Place it under the heel.   Complete by: As directed    Driving restrictions   Complete by: As directed     No driving for two weeks   Post-operative opioid taper instructions:   Complete by: As directed    POST-OPERATIVE OPIOID TAPER INSTRUCTIONS: It is important to wean off of your opioid medication as soon as possible. If you do not need pain medication after your surgery it is ok to stop day one. Opioids include: Codeine, Hydrocodone(Norco, Vicodin), Oxycodone(Percocet, oxycontin) and hydromorphone amongst others.  Long term and even short term use of opiods can cause: Increased pain response Dependence Constipation Depression Respiratory depression And more.  Withdrawal symptoms can include Flu  like symptoms Nausea, vomiting And more Techniques to manage these symptoms Hydrate well Eat regular healthy meals Stay active Use relaxation techniques(deep breathing, meditating, yoga) Do Not substitute Alcohol to help with tapering If you have been on opioids for less than two weeks and do not have pain than it is ok to stop all together.  Plan to wean off of opioids This plan should start within one week post op of your joint replacement. Maintain the same interval or time between taking each dose and first decrease the dose.  Cut the total daily intake of opioids by one tablet each day Next start to increase the time between doses. The last dose that should be eliminated is the evening dose.      TED hose   Complete by: As directed    Use stockings (TED hose) for three weeks on both leg(s).  You may remove them at night for sleeping.   Weight bearing as tolerated   Complete by: As directed         Follow-up Information     Aluisio, Pilar Plate, MD. Schedule an appointment as soon as possible for a visit in 2 week(s).   Specialty: Orthopedic Surgery Contact information: [address] [place] [address] [phone]                  Signed: Theresa Duty 12/11/2021, 8:16 AM

## 2021-12-24 ENCOUNTER — Other Ambulatory Visit (HOSPITAL_COMMUNITY): Payer: Self-pay

## 2021-12-24 NOTE — Telephone Encounter (Signed)
Patent Advocate Encounter  Test billing for this medication indicates that there is an approval on file effective until 09/28/2022.  Clista Bernhardt, CPhT Rx Patient Advocate Phone: [phone]

## 2021-12-25 ENCOUNTER — Other Ambulatory Visit (HOSPITAL_COMMUNITY): Payer: Self-pay

## 2022-02-21 ENCOUNTER — Other Ambulatory Visit (HOSPITAL_COMMUNITY): Payer: Self-pay

## 2022-05-29 ENCOUNTER — Other Ambulatory Visit: Payer: Self-pay

## 2022-06-06 ENCOUNTER — Encounter (HOSPITAL_COMMUNITY): Payer: Self-pay

## 2022-06-06 NOTE — Patient Instructions (Addendum)
DUE TO COVID-19 ONLY TWO VISITORS  (aged 60 and older)  ARE ALLOWED TO COME WITH YOU AND STAY IN THE WAITING ROOM ONLY DURING PRE OP AND PROCEDURE.   **NO VISITORS ARE ALLOWED IN THE SHORT STAY AREA OR RECOVERY ROOM!!**  IF YOU WILL BE ADMITTED INTO THE HOSPITAL YOU ARE ALLOWED ONLY FOUR SUPPORT PEOPLE DURING VISITATION HOURS ONLY (7 AM -8PM)   The support person(s) must pass our screening, gel in and out, and wear a mask at all times, including in the patient's room. Patients must also wear a mask when staff or their support person are in the room. Visitors GUEST BADGE MUST BE WORN VISIBLY  One adult visitor may remain with you overnight and MUST be in the room by 8 P.M.     Your procedure is scheduled on: 06/25/22   Report to [facility] Main Entrance    Report to admitting at  9:35 AM   Call this number if you have problems the morning of surgery [phone]   Do not eat food :After Midnight.   After Midnight you may have the following liquids until _9:00_ AM/ DAY OF SURGERY  Water Black Coffee (sugar ok, NO MILK/CREAM OR CREAMERS)  Tea (sugar ok, NO MILK/CREAM OR CREAMERS) regular and decaf                             Plain Jell-O (NO RED)                                           Fruit ices (not with fruit pulp, NO RED)                                     Popsicles (NO RED)                                                                  Juice: apple, WHITE grape, WHITE cranberry Sports drinks like Gatorade (NO RED)                  The day of surgery:  Drink ONE (1) Pre-Surgery Clear Ensure  at  8:45 AM the morning of surgery. Drink in one sitting. Do not sip.  This drink was given to you during your hospital  pre-op appointment visit. Nothing else to drink after completing the  Pre-Surgery Clear Ensure at 9:00 AM          If you have questions, please contact your surgeon's office.      Oral Hygiene is also important to reduce your risk of infection.                                     Remember - BRUSH YOUR TEETH THE MORNING OF SURGERY WITH YOUR REGULAR TOOTHPASTE  DENTURES WILL BE REMOVED PRIOR TO SURGERY PLEASE DO NOT APPLY "Poly grip" OR ADHESIVES!!!   Do NOT smoke after Midnight   Take  these medicines the morning of surgery with A SIP OF WATER: Citalopram                                                                                                                           Tolterodine   Bring CPAP mask and tubing day of surgery.                              You may not have any metal on your body including hair pins, jewelry, and body piercing             Do not wear make-up, lotions, powders, perfumes or deodorant  Do not wear nail polish including gel and S&S, artificial/acrylic nails, or any other type of covering on natural nails including finger and toenails. If you have artificial nails, gel coating, etc. that needs to be removed by a nail salon please have this removed prior to surgery or surgery may need to be canceled/ delayed if the surgeon/ anesthesia feels like they are unable to be safely monitored.   Do not shave  48 hours prior to surgery.    Do not bring valuables to the hospital. [place].   Contacts, glasses, or bridgework may not be worn into surgery.   Bring small overnight bag day of surgery.   DO NOT [place]. PHARMACY WILL DISPENSE MEDICATIONS LISTED ON YOUR MEDICATION LIST TO YOU DURING YOUR ADMISSION [place]!       Special Instructions: Bring a copy of your healthcare power of attorney and living will documents the day of surgery if you haven't scanned them before.              Please read over the following fact sheets you were given: IF YOU HAVE QUESTIONS ABOUT YOUR PRE-OP INSTRUCTIONS PLEASE CALL [phone]    [facility] - Preparing for Surgery Before surgery, you can play an important role.  Because skin  is not sterile, your skin needs to be as free of germs as possible.  You can reduce the number of germs on your skin by washing with CHG (chlorahexidine gluconate) soap before surgery.  CHG is an antiseptic cleaner which kills germs and bonds with the skin to continue killing germs even after washing. Please DO NOT use if you have an allergy to CHG or antibacterial soaps.  If your skin becomes reddened/irritated stop using the CHG and inform your nurse when you arrive at Short Stay. Do not shave (including legs and underarms) for at least 48 hours prior to the first CHG shower.   Please follow these instructions carefully:  1.  Shower with CHG Soap the night before surgery and the  morning of Surgery.  2.  If you choose to wash your hair, wash  your hair first as usual with your  normal  shampoo.  3.  After you shampoo, rinse your hair and body thoroughly to remove the  shampoo.                            4.  Use CHG as you would any other liquid soap.  You can apply chg directly  to the skin and wash                       Gently with a scrungie or clean washcloth.  5.  Apply the CHG Soap to your body ONLY FROM THE NECK DOWN.   Do not use on face/ open                           Wound or open sores. Avoid contact with eyes, ears mouth and genitals (private parts).                       Wash face,  Genitals (private parts) with your normal soap.             6.  Wash thoroughly, paying special attention to the area where your surgery  will be performed.  7.  Thoroughly rinse your body with warm water from the neck down.  8.  DO NOT shower/wash with your normal soap after using and rinsing off  the CHG Soap.             9.  Pat yourself dry with a clean towel.            10.  Wear clean pajamas.            11.  Place clean sheets on your bed the night of your first shower and do not  sleep with pets. Day of Surgery : Do not apply any lotions/deodorants the morning of surgery.  Please wear clean clothes  to the hospital/surgery center.  FAILURE TO FOLLOW THESE INSTRUCTIONS MAY RESULT IN THE CANCELLATION OF YOUR SURGERY  PATIENT SIGNATURE_________________________________  ________________________________________________________________________  [name] [name]  An incentive spirometer is a tool that can help keep your lungs clear and active. This tool measures how well you are filling your lungs with each breath. Taking long deep breaths may help reverse or decrease the chance of developing breathing (pulmonary) problems (especially infection) following: A long period of time when you are unable to move or be active. BEFORE THE PROCEDURE  If the spirometer includes an indicator to show your best effort, your nurse or respiratory therapist will set it to a desired goal. If possible, sit up straight or lean slightly forward. Try not to slouch. Hold the incentive spirometer in an upright position. INSTRUCTIONS FOR USE  Sit on the edge of your bed if possible, or sit up as far as you can in bed or on a chair. Hold the incentive spirometer in an upright position. Breathe out normally. Place the mouthpiece in your mouth and seal your lips tightly around it. Breathe in slowly and as deeply as possible, raising the piston or the ball toward the top of the column. Hold your breath for 3-5 seconds or for as long as possible. Allow the piston or ball to fall to the bottom of the column. Remove the mouthpiece from your mouth and breathe out normally. Rest for a few seconds and  repeat Steps 1 through 7 at least 10 times every 1-2 hours when you are awake. Take your time and take a few normal breaths between deep breaths. The spirometer may include an indicator to show your best effort. Use the indicator as a goal to work toward during each repetition. After each set of 10 deep breaths, practice coughing to be sure your lungs are clear. If you have an incision (the cut made at the time of surgery),  support your incision when coughing by placing a pillow or rolled up towels firmly against it. Once you are able to get out of bed, walk around indoors and cough well. You may stop using the incentive spirometer when instructed by your caregiver.  RISKS AND COMPLICATIONS Take your time so you do not get dizzy or light-headed. If you are in pain, you may need to take or ask for pain medication before doing incentive spirometry. It is harder to take a deep breath if you are having pain. AFTER USE Rest and breathe slowly and easily. It can be helpful to keep track of a log of your progress. Your caregiver can provide you with a simple table to help with this. If you are using the spirometer at home, follow these instructions: [place] IF:  You are having difficultly using the spirometer. You have trouble using the spirometer as often as instructed. Your pain medication is not giving enough relief while using the spirometer. You develop fever of 100.5 F (38.1 C) or higher. SEEK IMMEDIATE MEDICAL CARE IF:  You cough up bloody sputum that had not been present before. You develop fever of 102 F (38.9 C) or greater. You develop worsening pain at or near the incision site. MAKE SURE YOU:  Understand these instructions. Will watch your condition. Will get help right away if you are not doing well or get worse. Document Released: 08/04/2006 Document Revised: 06/16/2011 Document Reviewed: 10/05/2006 [facility] Patient Information 2014 [place], [place].   ________________________________________________________________________

## 2022-06-10 NOTE — H&P (Signed)
TOTAL KNEE REVISION ADMISSION H&P  Patient is being admitted for right total knee arthroplasty.  Subjective:  Chief Complaint: Right knee pain.  HPI: [name] [name], 62 y.o. female has a history of pain and functional disability in the right knee due to failed right total knee arthroplasty and has failed non-surgical conservative treatments for greater than 12 weeks to include flexibility and strengthening excercises, use of assistive devices, and activity modification. Onset of symptoms was gradual, starting 5 years ago with gradually worsening course since that time.  Patient currently rates pain in the right knee at 7 out of 10 with activity. Patient has worsening of pain with activity and weight bearing and pain that interferes with activities of daily living. Patient has evidence of right TKA malalignment by imaging studies. There is no active infection.  Patient Active Problem List   Diagnosis Date Noted   Failed total knee arthroplasty ([place]) 12/04/2021   Failed total left knee replacement ([place]) 12/04/2021   Status post total left knee replacement 02/28/2019   Chronic pain of left knee 02/28/2019   Status post total knee replacement, bilateral 12/01/2016   Status post bilateral foot surgery 12/01/2016   Contracture of right elbow 06/24/2016   Contracture of left elbow 06/24/2016   Rheumatoid arthritis involving multiple sites with positive rheumatoid factor ([place]) 05/27/2016   ANA positive 05/27/2016   Lateral epicondylitis, left elbow 05/27/2016   Trochanteric bursitis of left hip 05/27/2016   High risk medication use 05/27/2016   DJD (degenerative joint disease), cervical 05/27/2016   Osteopenia of neck of left femur 05/27/2016   Primary osteoarthritis of both feet 05/27/2016   S/P TAH-BSO (5/30) 09/04/2012    Past Medical History:  Diagnosis Date   Complication of anesthesia    "irregular heartbeat" during neck surgery   Depression    Fibroid    Heart murmur     2006, not heard since   Rheumatoid arthritis ([place])    Urinary incontinence     Past Surgical History:  Procedure Laterality Date   ABDOMINAL HYSTERECTOMY N/A 09/03/2012   Procedure: HYSTERECTOMY ABDOMINAL;  Surgeon: Marvene Staff, MD;  Location: [facility];  Service: Gynecology;  Laterality: N/A;   FOOT SURGERY Bilateral    joint implants, each foot x 2   JOINT REPLACEMENT     KNEE SURGERY Right 2006   fracture knee after replacement, repaired   POSTERIOR FUSION CERVICAL SPINE  2006   REPLACEMENT TOTAL KNEE Bilateral 1989   left 1989; right 1996   SALPINGOOPHORECTOMY Bilateral 09/03/2012   Procedure: SALPINGO OOPHORECTOMY;  Surgeon: Marvene Staff, MD;  Location: [facility];  Service: Gynecology;  Laterality: Bilateral;   TONSILLECTOMY  1978   TOTAL KNEE REVISION Left 12/04/2021   Procedure: TOTAL KNEE REVISION;  Surgeon: Gaynelle Arabian, MD;  Location: [facility];  Service: Orthopedics;  Laterality: Left;    Prior to Admission medications   Medication Sig Start Date End Date Taking? Authorizing Provider  acetaminophen (TYLENOL) 500 MG tablet Take 1,000 mg by mouth every 6 (six) hours as needed for pain.   Yes [provider]  citalopram (CELEXA) 20 MG tablet Take 1 tablet (20 mg total) by mouth daily. Patient taking differently: Take 20 mg by mouth daily as needed (depression). 06/03/16  Yes Deveshwar, Abel Presto, MD  folic acid (FOLVITE) 1 MG tablet Take 2 mg by mouth daily.   Yes [provider]  methotrexate (RHEUMATREX) 2.5 MG tablet TAKE 7 TABLETS(17.5 MG TOTAL) BY MOUTH ONCE A WEEK.  PROTECT FROM LIGHT 11/08/20  Yes Ofilia Neas, PA-C  ORENCIA CLICKJECT 0000000 MG/ML SOAJ INJECT ONE CLICKJECT PEN UNDER THE SKIN EVERY WEEK. REFRIGERATE. ALLOW TO WARM TO ROOM TEMPERATURE PRIOR TO ADMINISTRATION. 11/13/20  Yes Ofilia Neas, PA-C  NONFORMULARY OR COMPOUNDED ITEM Vit E vaginal suppositories 200u/ml.  One pv three times weekly. Patient not taking: Reported on 06/09/2022 05/05/17    Megan Salon, MD  oxyCODONE (OXY IR/ROXICODONE) 5 MG immediate release tablet Take 1-2 tablets (5-10 mg total) by mouth every 6 (six) hours as needed for moderate pain or severe pain. Patient not taking: Reported on 06/09/2022 12/05/21   Derl Barrow, PA    Allergies  Allergen Reactions   Sulfa Antibiotics Shortness Of Breath   Morphine And Related Rash   Penicillins Rash    Tolerated Cephalosporin Date: 12/05/21.      Social History   Socioeconomic History   Marital status: Divorced    Spouse name: Not on file   Number of children: Not on file   Years of education: Not on file   Highest education level: Not on file  Occupational History   Not on file  Tobacco Use   Smoking status: Never   Smokeless tobacco: Never  Vaping Use   Vaping Use: Never used  Substance and Sexual Activity   Alcohol use: Yes    Comment: Rare wine   Drug use: No   Sexual activity: Yes    Birth control/protection: Surgical  Other Topics Concern   Not on file  Social History Narrative   Not on file   Social Determinants of Health   Financial Resource Strain: Not on file  Food Insecurity: Not on file  Transportation Needs: Not on file  Physical Activity: Not on file  Stress: Not on file  Social Connections: Not on file  Intimate Partner Violence: Not on file    Tobacco Use: Low Risk  (06/06/2022)   Patient History    Smoking Tobacco Use: Never    Smokeless Tobacco Use: Never    Passive Exposure: Not on file   Social History   Substance and Sexual Activity  Alcohol Use Yes   Comment: Rare wine    Family History  Problem Relation Age of Onset   Ovarian cancer Mother 59   Lung cancer Father        asbestos   Cancer Brother 61       appendiceal   Seizures Brother    Heart Problems Brother    Colon cancer Maternal Aunt    Breast cancer Paternal Aunt 45   Colon cancer Maternal Grandmother 37   Stroke Paternal Grandmother    Heart disease Paternal Grandmother    Breast  cancer Paternal Aunt 74    ROS  Objective:  Physical Exam: The patient is a pleasant, well-developed female alert and oriented in no apparent distress.   Right Knee Exam: Slight valgus deformity. No effusion present. No swelling present. The range of motion is: 0 to 115 degrees. No crepitus on range of motion of the knee. No medial joint line tenderness. No lateral joint line tenderness. The knee is stable.  RESULTS  - AP and lateral of the bilateral knees dated 07/04/2021 demonstrate significant tibial subluxation anteriorly on the femur on the right. She also has some wear medial, preferentially, in the polyethylene. She has a very long tibial stem. It does not look like any of the metal parts are loose. On the left knee, she has  significant osteolysis in the patella and the patellar component appears loose. She has near complete wear through of the tibial polyethylene.  Assessment/Plan:  End stage arthritis, right knee   The patient history, physical examination, clinical judgment of the provider and imaging studies are consistent with end stage degenerative joint disease of the right knee and total knee arthroplasty is deemed medically necessary. The treatment options including medical management, injection therapy arthroscopy and arthroplasty were discussed at length. The risks and benefits of total knee arthroplasty were presented and reviewed. The risks due to aseptic loosening, infection, stiffness, patella tracking problems, thromboembolic complications and other imponderables were discussed. The patient acknowledged the explanation, agreed to proceed with the plan and consent was signed. Patient is being admitted for inpatient treatment for surgery, pain control, PT, OT, prophylactic antibiotics, VTE prophylaxis, progressive ambulation and ADLs and discharge planning. The patient is planning to be discharged home with home health services.  Anticipated LOS equal to or greater  than 2 midnights due to - Age 50 and older with one or more of the following:  - Obesity  - Expected need for hospital services (PT, OT, Nursing) required for safe  discharge  - Anticipated need for postoperative skilled nursing care or inpatient rehab  - Active co-morbidities: None OR   - Unanticipated findings during/Post Surgery: Slow post-op progression: GI, pain control, mobility    Therapy Plans: HHPT Disposition: Home with son Planned DVT Prophylaxis: Xarelto 10 mg QD DME Needed: None PCP:  Early Osmond, MD - clearance received Rheumatologist: Gavin Pound, MD TXA: IV Allergies: Morphine (rash), PCN (rash), sulfa (throat swelling) Anesthesia Concerns: Tachycardia with general anesthesia BMI: 29.4 Last HgbA1c: Not diabetic Pharmacy: [facility]  Other: -Instructed to hold Orencia injections for 2 weeks before and 2 weeks after surgery   - Patient was instructed on what medications to stop prior to surgery. - Follow-up visit in 2 weeks with Dr. Wynelle Link - Begin physical therapy following surgery - Pre-operative lab work as pre-surgical testing - Prescriptions will be provided in hospital at time of discharge  Shearon Balo, PA-C Orthopedic Surgery [facility] [place]

## 2022-06-12 ENCOUNTER — Encounter (HOSPITAL_COMMUNITY): Payer: Self-pay

## 2022-06-12 ENCOUNTER — Encounter (HOSPITAL_COMMUNITY)
Admission: RE | Admit: 2022-06-12 | Discharge: 2022-06-12 | Disposition: A | Discharge status: Home / Self Care | Payer: No Typology Code available for payment source | Source: Ambulatory Visit | Attending: Orthopedic Surgery | Admitting: Orthopedic Surgery

## 2022-06-12 ENCOUNTER — Other Ambulatory Visit: Payer: Self-pay

## 2022-06-12 DIAGNOSIS — Z01812 Encounter for preprocedural laboratory examination: Secondary | ICD-10-CM | POA: Diagnosis present

## 2022-06-12 DIAGNOSIS — Z01818 Encounter for other preprocedural examination: Secondary | ICD-10-CM

## 2022-06-12 LAB — SURGICAL PCR SCREEN
MRSA, PCR: NEGATIVE
Staphylococcus aureus: NEGATIVE

## 2022-06-12 LAB — BASIC METABOLIC PANEL
Anion gap: 4 — ABNORMAL LOW (ref 5–15)
BUN: 15 mg/dL (ref 6–20)
CO2: 25 mmol/L (ref 22–32)
Calcium: 8.9 mg/dL (ref 8.9–10.3)
Chloride: 106 mmol/L (ref 98–111)
Creatinine, Ser: 0.55 mg/dL (ref 0.44–1.00)
GFR, Estimated: >60 mL/min (ref >60)
Glucose, Bld: 84 mg/dL (ref 70–99)
Potassium: 4 mmol/L (ref 3.5–5.1)
Sodium: 135 mmol/L (ref 135–145)

## 2022-06-12 LAB — TYPE AND SCREEN
ABO/RH(D): O POS
Antibody Screen: NEGATIVE

## 2022-06-12 LAB — CBC
HCT: 41.8 % (ref 36.0–46.0)
Hemoglobin: 13.1 g/dL (ref 12.0–15.0)
MCH: 27.9 pg (ref 26.0–34.0)
MCHC: 31.3 g/dL (ref 30.0–36.0)
MCV: 88.9 fL (ref 80.0–100.0)
Platelets: 280 10*3/uL (ref 150–400)
RBC: 4.7 MIL/uL (ref 3.87–5.11)
RDW: 15.1 % (ref 11.5–15.5)
WBC: 6.7 10*3/uL (ref 4.0–10.5)
nRBC: 0 % (ref 0.0–0.2)

## 2022-06-12 NOTE — Progress Notes (Signed)
Anesthesia note:  Bowel prep reminder:NA    PCP - Dr. Early Osmond Cardiologist -nonr Other- Rheumatology- dr. Bo Merino  Chest x-ray - no EKG - 11/20/21-epic Stress Test - no ECHO - no Cardiac Cath - no CABG-no Pacemaker/ICD device last checked:NA  Sleep Study - no CPAP -   Pt is pre diabetic-NA CBG at PAT visit- Fasting Blood Sugar at home- Checks Blood Sugar _____  Blood Thinner:no Blood Thinner Instructions: Aspirin Instructions: Last Dose:  Anesthesia review: /No    Patient denies shortness of breath, fever, cough and chest pain at PAT appointment. Pt reports no SOB with activities. She uses a cane and walker at home and has a chair lift on her stairs. She was told to continue her methotrexate. The dose that is due on DOS she will take when she gets home.   Patient verbalized understanding of instructions that were given to them at the PAT appointment. Patient was also instructed that they will need to review over the PAT instructions again at home before surgery.yes

## 2022-06-24 NOTE — Anesthesia Preprocedure Evaluation (Signed)
Anesthesia Evaluation  Patient identified by MRN, date of birth, ID band Patient awake    Reviewed: Allergy & Precautions, NPO status , Patient's Chart, lab work & pertinent test results  History of Anesthesia Complications (+) history of anesthetic complications (post spinal fusion 2008- irregular HR, marked as difficult airway but was not intubated for that surgery)  Airway Mallampati: III  TM Distance: >3 FB Neck ROM: Full    Dental  (+) Teeth Intact, Dental Advisory Given   Pulmonary neg pulmonary ROS   Pulmonary exam normal breath sounds clear to auscultation       Cardiovascular negative cardio ROS Normal cardiovascular exam Rhythm:Regular Rate:Normal     Neuro/Psych  PSYCHIATRIC DISORDERS  Depression    negative neurological ROS     GI/Hepatic negative GI ROS, Neg liver ROS,,,  Endo/Other  negative endocrine ROS    Renal/GU negative Renal ROS  negative genitourinary   Musculoskeletal  (+) Arthritis , Osteoarthritis and Rheumatoid disorders,    Abdominal   Peds  Hematology negative hematology ROS (+) Hb 13.1, plt 280   Anesthesia Other Findings   Reproductive/Obstetrics negative OB ROS                             Anesthesia Physical Anesthesia Plan  ASA: 3  Anesthesia Plan: MAC, Spinal and Regional   Post-op Pain Management: Regional block* and Tylenol PO (pre-op)*   Induction:   PONV Risk Score and Plan: 2 and Propofol infusion and TIVA  Airway Management Planned: Natural Airway and Simple Face Mask  Additional Equipment: None  Intra-op Plan:   Post-operative Plan:   Informed Consent: I have reviewed the patients History and Physical, chart, labs and discussed the procedure including the risks, benefits and alternatives for the proposed anesthesia with the patient or authorized representative who has indicated his/her understanding and acceptance.       Plan  Discussed with: CRNA  Anesthesia Plan Comments: (Labeled difficult airway 12/04/21 but had spinal for that anesthetic, was not intubated)        Anesthesia Quick Evaluation

## 2022-06-25 ENCOUNTER — Inpatient Hospital Stay (HOSPITAL_COMMUNITY): Payer: Medicare Other | Admitting: Anesthesiology

## 2022-06-25 ENCOUNTER — Encounter (HOSPITAL_COMMUNITY): Payer: Self-pay | Admitting: Orthopedic Surgery

## 2022-06-25 ENCOUNTER — Encounter (HOSPITAL_COMMUNITY)
Admission: RE | Disposition: A | Discharge status: Home Health | Payer: Self-pay | Source: Ambulatory Visit | Attending: Orthopedic Surgery

## 2022-06-25 ENCOUNTER — Inpatient Hospital Stay (HOSPITAL_COMMUNITY)
Admission: RE | Admit: 2022-06-25 | Discharge: 2022-06-29 | DRG: 468 | Disposition: A | Discharge status: Home Health | Payer: Medicare Other | Source: Ambulatory Visit | Attending: Orthopedic Surgery | Admitting: Orthopedic Surgery

## 2022-06-25 ENCOUNTER — Other Ambulatory Visit: Payer: Self-pay

## 2022-06-25 DIAGNOSIS — T84012A Broken internal right knee prosthesis, initial encounter: Secondary | ICD-10-CM

## 2022-06-25 DIAGNOSIS — Z823 Family history of stroke: Secondary | ICD-10-CM | POA: Diagnosis not present

## 2022-06-25 DIAGNOSIS — Z800 Family history of malignant neoplasm of digestive organs: Secondary | ICD-10-CM

## 2022-06-25 DIAGNOSIS — Z803 Family history of malignant neoplasm of breast: Secondary | ICD-10-CM

## 2022-06-25 DIAGNOSIS — Z8041 Family history of malignant neoplasm of ovary: Secondary | ICD-10-CM

## 2022-06-25 DIAGNOSIS — Z90710 Acquired absence of both cervix and uterus: Secondary | ICD-10-CM

## 2022-06-25 DIAGNOSIS — Z79631 Long term (current) use of antimetabolite agent: Secondary | ICD-10-CM

## 2022-06-25 DIAGNOSIS — Z90722 Acquired absence of ovaries, bilateral: Secondary | ICD-10-CM

## 2022-06-25 DIAGNOSIS — Y792 Prosthetic and other implants, materials and accessory orthopedic devices associated with adverse incidents: Secondary | ICD-10-CM | POA: Diagnosis present

## 2022-06-25 DIAGNOSIS — Z885 Allergy status to narcotic agent status: Secondary | ICD-10-CM

## 2022-06-25 DIAGNOSIS — Z96652 Presence of left artificial knee joint: Secondary | ICD-10-CM | POA: Diagnosis present

## 2022-06-25 DIAGNOSIS — Z96659 Presence of unspecified artificial knee joint: Secondary | ICD-10-CM

## 2022-06-25 DIAGNOSIS — M069 Rheumatoid arthritis, unspecified: Secondary | ICD-10-CM | POA: Diagnosis not present

## 2022-06-25 DIAGNOSIS — T84018A Broken internal joint prosthesis, other site, initial encounter: Secondary | ICD-10-CM

## 2022-06-25 DIAGNOSIS — Z882 Allergy status to sulfonamides status: Secondary | ICD-10-CM

## 2022-06-25 DIAGNOSIS — Z880 Allergy status to penicillin: Secondary | ICD-10-CM

## 2022-06-25 DIAGNOSIS — T84092A Other mechanical complication of internal right knee prosthesis, initial encounter: Secondary | ICD-10-CM | POA: Diagnosis present

## 2022-06-25 DIAGNOSIS — Z79811 Long term (current) use of aromatase inhibitors: Secondary | ICD-10-CM

## 2022-06-25 DIAGNOSIS — F32A Depression, unspecified: Secondary | ICD-10-CM | POA: Diagnosis present

## 2022-06-25 DIAGNOSIS — Z01818 Encounter for other preprocedural examination: Secondary | ICD-10-CM

## 2022-06-25 DIAGNOSIS — Z801 Family history of malignant neoplasm of trachea, bronchus and lung: Secondary | ICD-10-CM | POA: Diagnosis not present

## 2022-06-25 DIAGNOSIS — Z8249 Family history of ischemic heart disease and other diseases of the circulatory system: Secondary | ICD-10-CM

## 2022-06-25 DIAGNOSIS — T84093A Other mechanical complication of internal left knee prosthesis, initial encounter: Secondary | ICD-10-CM

## 2022-06-25 DIAGNOSIS — Z79899 Other long term (current) drug therapy: Secondary | ICD-10-CM

## 2022-06-25 HISTORY — PX: TOTAL KNEE REVISION: SHX996

## 2022-06-25 SURGERY — TOTAL KNEE REVISION
Anesthesia: Monitor Anesthesia Care | Site: Knee | Laterality: Right

## 2022-06-25 MED ORDER — LIDOCAINE 2% (20 MG/ML) 5 ML SYRINGE
INTRAMUSCULAR | Status: DC | PRN
  Administered 2022-06-25: 40 mg via INTRAVENOUS

## 2022-06-25 MED ORDER — ONDANSETRON HCL 4 MG/2ML IJ SOLN: 4.0000 mg | Freq: Four times a day (QID) | INTRAMUSCULAR | Status: DC | PRN

## 2022-06-25 MED ORDER — GLYCOPYRROLATE 0.2 MG/ML IJ SOLN
INTRAMUSCULAR | Status: DC | PRN
  Administered 2022-06-25 (×2): .1 mg via INTRAVENOUS

## 2022-06-25 MED ORDER — TRANEXAMIC ACID-NACL 1000-0.7 MG/100ML-% IV SOLN
1000.0000 mg | INTRAVENOUS | Status: AC
  Administered 2022-06-25: 1000 mg via INTRAVENOUS
  Filled 2022-06-25: qty 100

## 2022-06-25 MED ORDER — ONDANSETRON HCL 4 MG/2ML IJ SOLN: 4.0000 mg | Freq: Once | INTRAMUSCULAR | Status: DC | PRN

## 2022-06-25 MED ORDER — ROPIVACAINE HCL 5 MG/ML IJ SOLN
INTRAMUSCULAR | Status: DC | PRN
  Administered 2022-06-25: 30 mL via PERINEURAL

## 2022-06-25 MED ORDER — RIVAROXABAN 10 MG PO TABS
10.0000 mg | ORAL_TABLET | Freq: Every day | ORAL | Status: DC
  Administered 2022-06-26 – 2022-06-29 (×4): 10 mg via ORAL
  Filled 2022-06-25 (×4): qty 1

## 2022-06-25 MED ORDER — SODIUM CHLORIDE (PF) 0.9 % IJ SOLN
INTRAMUSCULAR | Status: AC
  Filled 2022-06-25: qty 10

## 2022-06-25 MED ORDER — STERILE WATER FOR IRRIGATION IR SOLN
Status: DC | PRN
  Administered 2022-06-25: 2000 mL

## 2022-06-25 MED ORDER — PHENOL 1.4 % MT LIQD
1.0000 | OROMUCOSAL | Status: DC | PRN
  Filled 2022-06-25: qty 177

## 2022-06-25 MED ORDER — GLYCOPYRROLATE 0.2 MG/ML IJ SOLN
INTRAMUSCULAR | Status: AC
  Filled 2022-06-25: qty 1

## 2022-06-25 MED ORDER — BUPIVACAINE IN DEXTROSE 0.75-8.25 % IT SOLN
INTRATHECAL | Status: DC | PRN
  Administered 2022-06-25: 2 mL via INTRATHECAL

## 2022-06-25 MED ORDER — DOCUSATE SODIUM 100 MG PO CAPS
100.0000 mg | ORAL_CAPSULE | Freq: Two times a day (BID) | ORAL | Status: DC
  Administered 2022-06-25 – 2022-06-29 (×8): 100 mg via ORAL
  Filled 2022-06-25 (×8): qty 1

## 2022-06-25 MED ORDER — CEFAZOLIN SODIUM-DEXTROSE 2-4 GM/100ML-% IV SOLN
2.0000 g | INTRAVENOUS | Status: AC
  Administered 2022-06-25: 2 g via INTRAVENOUS
  Filled 2022-06-25: qty 100

## 2022-06-25 MED ORDER — ACETAMINOPHEN 10 MG/ML IV SOLN
1000.0000 mg | INTRAVENOUS | Status: AC
  Administered 2022-06-25: 1000 mg via INTRAVENOUS
  Filled 2022-06-25: qty 100

## 2022-06-25 MED ORDER — OXYCODONE HCL 5 MG PO TABS: 5.0000 mg | ORAL_TABLET | Freq: Once | ORAL | Status: DC | PRN

## 2022-06-25 MED ORDER — PROPOFOL 500 MG/50ML IV EMUL
INTRAVENOUS | Status: DC | PRN
  Administered 2022-06-25: 100 ug/kg/min via INTRAVENOUS

## 2022-06-25 MED ORDER — METHOCARBAMOL 500 MG IVPB - SIMPLE MED
500.0000 mg | Freq: Four times a day (QID) | INTRAVENOUS | Status: DC | PRN
  Filled 2022-06-25: qty 55

## 2022-06-25 MED ORDER — CEFAZOLIN SODIUM-DEXTROSE 2-4 GM/100ML-% IV SOLN
2.0000 g | Freq: Four times a day (QID) | INTRAVENOUS | Status: AC
  Administered 2022-06-25 (×2): 2 g via INTRAVENOUS
  Filled 2022-06-25 (×2): qty 100

## 2022-06-25 MED ORDER — METOCLOPRAMIDE HCL 5 MG/ML IJ SOLN: 5.0000 mg | Freq: Three times a day (TID) | INTRAMUSCULAR | Status: DC | PRN

## 2022-06-25 MED ORDER — CHLORHEXIDINE GLUCONATE 0.12 % MT SOLN
15.0000 mL | Freq: Once | OROMUCOSAL | Status: AC
  Administered 2022-06-25: 15 mL via OROMUCOSAL

## 2022-06-25 MED ORDER — POLYETHYLENE GLYCOL 3350 17 G PO PACK: 17.0000 g | PACK | Freq: Every day | ORAL | Status: DC | PRN

## 2022-06-25 MED ORDER — HYDROMORPHONE HCL 1 MG/ML IJ SOLN: 0.2500 mg | INTRAMUSCULAR | Status: DC | PRN

## 2022-06-25 MED ORDER — PROPOFOL 10 MG/ML IV BOLUS
INTRAVENOUS | Status: AC
  Filled 2022-06-25: qty 20

## 2022-06-25 MED ORDER — FENTANYL CITRATE PF 50 MCG/ML IJ SOSY: 25.0000 ug | PREFILLED_SYRINGE | Freq: Once | INTRAMUSCULAR | Status: AC

## 2022-06-25 MED ORDER — OXYCODONE HCL 5 MG/5ML PO SOLN: 5.0000 mg | Freq: Once | ORAL | Status: DC | PRN

## 2022-06-25 MED ORDER — DEXMEDETOMIDINE HCL IN NACL 80 MCG/20ML IV SOLN
INTRAVENOUS | Status: DC | PRN
  Administered 2022-06-25: 4 ug via BUCCAL
  Administered 2022-06-25: 8 ug via BUCCAL
  Administered 2022-06-25: 4 ug via BUCCAL

## 2022-06-25 MED ORDER — FENTANYL CITRATE PF 50 MCG/ML IJ SOSY
PREFILLED_SYRINGE | INTRAMUSCULAR | Status: AC
  Administered 2022-06-25: 50 ug
  Filled 2022-06-25: qty 2

## 2022-06-25 MED ORDER — DEXAMETHASONE SODIUM PHOSPHATE 10 MG/ML IJ SOLN
INTRAMUSCULAR | Status: DC | PRN
  Administered 2022-06-25: 10 mg

## 2022-06-25 MED ORDER — METHOCARBAMOL 500 MG PO TABS
500.0000 mg | ORAL_TABLET | Freq: Four times a day (QID) | ORAL | Status: DC | PRN
  Administered 2022-06-25 – 2022-06-29 (×7): 500 mg via ORAL
  Filled 2022-06-25 (×7): qty 1

## 2022-06-25 MED ORDER — BUPIVACAINE LIPOSOME 1.3 % IJ SUSP
INTRAMUSCULAR | Status: AC
  Filled 2022-06-25: qty 20

## 2022-06-25 MED ORDER — ONDANSETRON HCL 4 MG/2ML IJ SOLN
INTRAMUSCULAR | Status: AC
  Filled 2022-06-25: qty 2

## 2022-06-25 MED ORDER — BUPIVACAINE LIPOSOME 1.3 % IJ SUSP
INTRAMUSCULAR | Status: DC | PRN
  Administered 2022-06-25: 20 mL

## 2022-06-25 MED ORDER — OXYCODONE HCL 5 MG PO TABS
10.0000 mg | ORAL_TABLET | ORAL | Status: DC | PRN
  Administered 2022-06-25: 10 mg via ORAL
  Administered 2022-06-25: 15 mg via ORAL
  Administered 2022-06-26 – 2022-06-28 (×7): 10 mg via ORAL
  Filled 2022-06-25: qty 3
  Filled 2022-06-25: qty 2
  Filled 2022-06-25: qty 3
  Filled 2022-06-25 (×7): qty 2

## 2022-06-25 MED ORDER — SODIUM CHLORIDE (PF) 0.9 % IJ SOLN
INTRAMUSCULAR | Status: AC
  Filled 2022-06-25: qty 50

## 2022-06-25 MED ORDER — ONDANSETRON HCL 4 MG/2ML IJ SOLN
INTRAMUSCULAR | Status: DC | PRN
  Administered 2022-06-25: 4 mg via INTRAVENOUS

## 2022-06-25 MED ORDER — ORAL CARE MOUTH RINSE: 15.0000 mL | Freq: Once | OROMUCOSAL | Status: AC

## 2022-06-25 MED ORDER — LACTATED RINGERS IV SOLN: INTRAVENOUS | Status: DC

## 2022-06-25 MED ORDER — BUPIVACAINE LIPOSOME 1.3 % IJ SUSP: 20.0000 mL | Freq: Once | INTRAMUSCULAR | Status: AC

## 2022-06-25 MED ORDER — ACETAMINOPHEN 500 MG PO TABS
1000.0000 mg | ORAL_TABLET | Freq: Four times a day (QID) | ORAL | Status: AC
  Administered 2022-06-25 – 2022-06-26 (×3): 1000 mg via ORAL
  Filled 2022-06-25 (×3): qty 2

## 2022-06-25 MED ORDER — EPHEDRINE SULFATE-NACL 50-0.9 MG/10ML-% IV SOSY
PREFILLED_SYRINGE | INTRAVENOUS | Status: DC | PRN
  Administered 2022-06-25 (×3): 5 mg via INTRAVENOUS

## 2022-06-25 MED ORDER — SODIUM CHLORIDE (PF) 0.9 % IJ SOLN
INTRAMUSCULAR | Status: DC | PRN
  Administered 2022-06-25: 60 mL

## 2022-06-25 MED ORDER — GABAPENTIN 300 MG PO CAPS
300.0000 mg | ORAL_CAPSULE | Freq: Three times a day (TID) | ORAL | Status: DC
  Administered 2022-06-25 – 2022-06-29 (×13): 300 mg via ORAL
  Filled 2022-06-25 (×13): qty 1

## 2022-06-25 MED ORDER — ACETAMINOPHEN 500 MG PO TABS: 1000.0000 mg | ORAL_TABLET | Freq: Once | ORAL | Status: AC

## 2022-06-25 MED ORDER — MIDAZOLAM HCL 2 MG/2ML IJ SOLN
INTRAMUSCULAR | Status: AC
  Administered 2022-06-25: 2 mg
  Filled 2022-06-25: qty 2

## 2022-06-25 MED ORDER — MIDAZOLAM HCL 2 MG/2ML IJ SOLN: 0.5000 mg | Freq: Once | INTRAMUSCULAR | Status: AC

## 2022-06-25 MED ORDER — ORAL CARE MOUTH RINSE: 15.0000 mL | OROMUCOSAL | Status: DC | PRN

## 2022-06-25 MED ORDER — POVIDONE-IODINE 10 % EX SWAB
2.0000 | Freq: Once | CUTANEOUS | Status: AC
  Administered 2022-06-25: 2 via TOPICAL

## 2022-06-25 MED ORDER — ONDANSETRON HCL 4 MG PO TABS: 4.0000 mg | ORAL_TABLET | Freq: Four times a day (QID) | ORAL | Status: DC | PRN

## 2022-06-25 MED ORDER — MENTHOL 3 MG MT LOZG
1.0000 | LOZENGE | OROMUCOSAL | Status: DC | PRN
  Administered 2022-06-25 – 2022-06-26 (×2): 3 mg via ORAL
  Filled 2022-06-25 (×2): qty 9

## 2022-06-25 MED ORDER — PHENYLEPHRINE HCL-NACL 20-0.9 MG/250ML-% IV SOLN
INTRAVENOUS | Status: DC | PRN
  Administered 2022-06-25: 30 ug/min via INTRAVENOUS

## 2022-06-25 MED ORDER — DIPHENHYDRAMINE HCL 12.5 MG/5ML PO ELIX: 12.5000 mg | ORAL_SOLUTION | ORAL | Status: DC | PRN

## 2022-06-25 MED ORDER — METOCLOPRAMIDE HCL 5 MG PO TABS: 5.0000 mg | ORAL_TABLET | Freq: Three times a day (TID) | ORAL | Status: DC | PRN

## 2022-06-25 MED ORDER — DEXAMETHASONE SODIUM PHOSPHATE 10 MG/ML IJ SOLN
10.0000 mg | Freq: Once | INTRAMUSCULAR | Status: AC
  Administered 2022-06-26: 10 mg via INTRAVENOUS
  Filled 2022-06-25: qty 1

## 2022-06-25 MED ORDER — 0.9 % SODIUM CHLORIDE (POUR BTL) OPTIME
TOPICAL | Status: DC | PRN
  Administered 2022-06-25: 1000 mL

## 2022-06-25 MED ORDER — DEXAMETHASONE SODIUM PHOSPHATE 10 MG/ML IJ SOLN: 8.0000 mg | Freq: Once | INTRAMUSCULAR | Status: AC

## 2022-06-25 MED ORDER — PROPOFOL 1000 MG/100ML IV EMUL
INTRAVENOUS | Status: AC
  Filled 2022-06-25: qty 100

## 2022-06-25 MED ORDER — CITALOPRAM HYDROBROMIDE 20 MG PO TABS: 20.0000 mg | ORAL_TABLET | Freq: Every day | ORAL | Status: DC | PRN

## 2022-06-25 MED ORDER — SODIUM CHLORIDE 0.9 % IV SOLN: INTRAVENOUS | Status: DC

## 2022-06-25 MED ORDER — OXYCODONE HCL 5 MG PO TABS
5.0000 mg | ORAL_TABLET | ORAL | Status: DC | PRN
  Administered 2022-06-26 – 2022-06-29 (×7): 10 mg via ORAL
  Filled 2022-06-25 (×6): qty 2

## 2022-06-25 MED ORDER — PROPOFOL 10 MG/ML IV BOLUS
INTRAVENOUS | Status: DC | PRN
  Administered 2022-06-25: 10 mg via INTRAVENOUS
  Administered 2022-06-25: 30 mg via INTRAVENOUS
  Administered 2022-06-25: 10 mg via INTRAVENOUS

## 2022-06-25 MED ORDER — FLEET ENEMA 7-19 GM/118ML RE ENEM: 1.0000 | ENEMA | Freq: Once | RECTAL | Status: DC | PRN

## 2022-06-25 MED ORDER — HYDROMORPHONE HCL 1 MG/ML IJ SOLN
0.5000 mg | INTRAMUSCULAR | Status: DC | PRN
  Administered 2022-06-25: 1 mg via INTRAVENOUS
  Administered 2022-06-27 (×2): 0.5 mg via INTRAVENOUS
  Filled 2022-06-25 (×3): qty 1

## 2022-06-25 MED ORDER — DEXMEDETOMIDINE HCL IN NACL 80 MCG/20ML IV SOLN
INTRAVENOUS | Status: AC
  Filled 2022-06-25: qty 20

## 2022-06-25 MED ORDER — BISACODYL 10 MG RE SUPP: 10.0000 mg | Freq: Every day | RECTAL | Status: DC | PRN

## 2022-06-25 SURGICAL SUPPLY — 77 items
AUG FEM SZ3 4 REV DIST STRL LF (Miscellaneous) ×2 IMPLANT
AUG FEM SZ3 4 REV POST STRL LF (Miscellaneous) ×2 IMPLANT
AUG TIB .75 10 REV CMNT LM/RL (Joint) ×1 IMPLANT
AUG TIB .75 10 REV CMNT RM/LL (Joint) ×1 IMPLANT
AUG TIB ATTUNE LM/RL SZ3/4X10 (Joint) ×1 IMPLANT
AUG TIB ATTUNE RM/LL SZ3/4X10 (Joint) ×1 IMPLANT
AUGMENT FEMORAL DISTAL SZ3 4 (Miscellaneous) IMPLANT
AUGMENT POS FEM S3 4 (Miscellaneous) IMPLANT
AUGMENT TIB ATT LM/RL SZ3/4X10 (Joint) IMPLANT
AUGMENT TIB ATT RM/LL SZ3/4X10 (Joint) IMPLANT
BAG COUNTER SPONGE SURGICOUNT (BAG) IMPLANT
BAG SPEC THK2 15X12 ZIP CLS (MISCELLANEOUS)
BAG SPNG CNTER NS LX DISP (BAG)
BAG ZIPLOCK 12X15 (MISCELLANEOUS) IMPLANT
BLADE SAG 18X100X1.27 (BLADE) ×2 IMPLANT
BLADE SAW SGTL 11.0X1.19X90.0M (BLADE) ×2 IMPLANT
BLADE SURG SZ10 CARB STEEL (BLADE) IMPLANT
BNDG CMPR 5X62 HK CLSR LF (GAUZE/BANDAGES/DRESSINGS) ×1
BNDG CMPR MED 10X6 ELC LF (GAUZE/BANDAGES/DRESSINGS) ×1
BNDG ELASTIC 6INX 5YD STR LF (GAUZE/BANDAGES/DRESSINGS) ×2 IMPLANT
BNDG ELASTIC 6X10 VLCR STRL LF (GAUZE/BANDAGES/DRESSINGS) IMPLANT
BONE CEMENT GENTAMICIN (Cement) ×2 IMPLANT
BSPLAT TIB 3 CMNT REV ROT PLAT (Knees) ×1 IMPLANT
CEMENT BONE GENTAMICIN 40 (Cement) ×6 IMPLANT
COMP FEM ATTUNE CRS CEM RT SZ3 (Femur) ×1 IMPLANT
COMPONENT FEM ATN CR CEM RTSZ3 (Femur) IMPLANT
COVER SURGICAL LIGHT HANDLE (MISCELLANEOUS) ×2 IMPLANT
CUFF TOURN SGL QUICK 34 (TOURNIQUET CUFF) ×1
CUFF TRNQT CYL 34X4.125X (TOURNIQUET CUFF) ×2 IMPLANT
DRAPE U-SHAPE 47X51 STRL (DRAPES) ×2 IMPLANT
DRESSING AQUACEL AG SP 3.5X10 (GAUZE/BANDAGES/DRESSINGS) IMPLANT
DRSG AQUACEL AG ADV 3.5X10 (GAUZE/BANDAGES/DRESSINGS) ×2 IMPLANT
DRSG AQUACEL AG SP 3.5X10 (GAUZE/BANDAGES/DRESSINGS) ×1
DSTL FMRL AGMT SZ3 4 (Miscellaneous) ×2 IMPLANT
DURAPREP 26ML APPLICATOR (WOUND CARE) ×2 IMPLANT
ELECT REM PT RETURN 15FT ADLT (MISCELLANEOUS) ×2 IMPLANT
GLOVE BIO SURGEON STRL SZ 6.5 (GLOVE) IMPLANT
GLOVE BIO SURGEON STRL SZ7 (GLOVE) IMPLANT
GLOVE BIO SURGEON STRL SZ8 (GLOVE) ×4 IMPLANT
GLOVE BIOGEL PI IND STRL 7.0 (GLOVE) IMPLANT
GLOVE BIOGEL PI IND STRL 8 (GLOVE) ×2 IMPLANT
GOWN STRL REUS W/ TWL LRG LVL3 (GOWN DISPOSABLE) ×2 IMPLANT
GOWN STRL REUS W/TWL LRG LVL3 (GOWN DISPOSABLE) ×1
HANDPIECE INTERPULSE COAX TIP (DISPOSABLE) ×1
HOLDER FOLEY CATH W/STRAP (MISCELLANEOUS) IMPLANT
IMMOBILIZER KNEE 20 (SOFTGOODS) ×1
IMMOBILIZER KNEE 20 THIGH 36 (SOFTGOODS) ×2 IMPLANT
INSERT TIB CMT ATTUNE RP SZ3 (Knees) IMPLANT
INSERT TIB CRS ATTUNE SZ3 14 (Insert) IMPLANT
KIT TURNOVER KIT A (KITS) IMPLANT
MANIFOLD NEPTUNE II (INSTRUMENTS) ×2 IMPLANT
NS IRRIG 1000ML POUR BTL (IV SOLUTION) ×2 IMPLANT
PACK TOTAL KNEE CUSTOM (KITS) ×2 IMPLANT
PADDING CAST ABS COTTON 6X4 NS (CAST SUPPLIES) IMPLANT
PADDING CAST COTTON 6X4 STRL (CAST SUPPLIES) ×4 IMPLANT
PATELLA MEDIAL ATTUN 35MM KNEE (Knees) IMPLANT
PIN STEINMAN FIXATION KNEE (PIN) IMPLANT
PROTECTOR NERVE ULNAR (MISCELLANEOUS) ×2 IMPLANT
SET HNDPC FAN SPRY TIP SCT (DISPOSABLE) ×2 IMPLANT
SLEEVE KNEE ATTUNE 29MM (Knees) IMPLANT
SPIKE FLUID TRANSFER (MISCELLANEOUS) IMPLANT
STEM STR ATTUNE PF 14X110 (Knees) IMPLANT
STEM STR ATTUNE PF 14X60 (Knees) IMPLANT
STRIP CLOSURE SKIN 1/2X4 (GAUZE/BANDAGES/DRESSINGS) ×4 IMPLANT
SUT MNCRL AB 4-0 PS2 18 (SUTURE) ×2 IMPLANT
SUT STRATAFIX 0 PDS 27 VIOLET (SUTURE) ×1
SUT VIC AB 2-0 CT1 27 (SUTURE) ×3
SUT VIC AB 2-0 CT1 TAPERPNT 27 (SUTURE) ×6 IMPLANT
SUTURE STRATFX 0 PDS 27 VIOLET (SUTURE) ×2 IMPLANT
SWAB COLLECTION DEVICE MRSA (MISCELLANEOUS) IMPLANT
SWAB CULTURE ESWAB REG 1ML (MISCELLANEOUS) IMPLANT
TOWER CARTRIDGE SMART MIX (DISPOSABLE) ×2 IMPLANT
TRAY FOLEY MTR SLVR 16FR STAT (SET/KITS/TRAYS/PACK) ×2 IMPLANT
TUBE KAMVAC SUCTION (TUBING) IMPLANT
TUBE SUCTION HIGH CAP CLEAR NV (SUCTIONS) ×2 IMPLANT
WATER STERILE IRR 1000ML POUR (IV SOLUTION) ×2 IMPLANT
WRAP KNEE MAXI GEL POST OP (GAUZE/BANDAGES/DRESSINGS) IMPLANT

## 2022-06-25 NOTE — Anesthesia Procedure Notes (Signed)
Spinal  Patient location during procedure: OR Start time: 06/25/2022 11:09 AM End time: 06/25/2022 11:13 AM Staffing Performed: resident/CRNA  Anesthesiologist: Pervis Hocking, DO Resident/CRNA: Gwyndolyn Saxon, CRNA Performed by: Gwyndolyn Saxon, CRNA Authorized by: Pervis Hocking, DO   Preanesthetic Checklist Completed: patient identified, IV checked, site marked, risks and benefits discussed, surgical consent, monitors and equipment checked, pre-op evaluation and timeout performed Spinal Block Patient position: sitting Prep: DuraPrep Patient monitoring: heart rate, continuous pulse ox and blood pressure Approach: midline Location: L3-4 Injection technique: single-shot Needle Needle type: Pencan  Needle gauge: 24 G Assessment Sensory level: T6 Events: CSF return

## 2022-06-25 NOTE — Anesthesia Procedure Notes (Signed)
Anesthesia Regional Block: Adductor canal block   Pre-Anesthetic Checklist: , timeout performed,  Correct Patient, Correct Site, Correct Laterality,  Correct Procedure, Correct Position, site marked,  Risks and benefits discussed,  Surgical consent,  Pre-op evaluation,  At surgeon's request and post-op pain management  Laterality: Right  Prep: Maximum Sterile Barrier Precautions used, chloraprep       Needles:  Injection technique: Single-shot  Needle Type: Echogenic Stimulator Needle     Needle Length: 9cm  Needle Gauge: 22     Additional Needles:   Procedures:,,,, ultrasound used (permanent image in chart),,    Narrative:  Start time: 06/25/2022 10:25 AM End time: 06/25/2022 10:30 AM Injection made incrementally with aspirations every 5 mL.  Performed by: Personally  Anesthesiologist: Pervis Hocking, DO  Additional Notes: Monitors applied. No increased pain on injection. No increased resistance to injection. Injection made in 5cc increments. Good needle visualization. Patient tolerated procedure well.

## 2022-06-25 NOTE — Discharge Instructions (Signed)
[name] Arabian, MD [facility] [address], [address] [place], [place] [address] [phone]  KNEE REVISION POSTOPERATIVE DIRECTIONS  Knee Rehabilitation, Guidelines Following Surgery  Results after knee surgery are often greatly improved when you follow the exercise, range of motion and muscle strengthening exercises prescribed by your doctor. Safety measures are also important to protect the knee from further injury. If any of these exercises cause you to have increased pain or swelling in your knee joint, decrease the amount until you are comfortable again and slowly increase them. If you have problems or questions, call your caregiver or physical therapist for advice.   BLOOD CLOT PREVENTION Take a 10 mg Xarelto once a day for three weeks following surgery. Then take an 81 mg Aspirin once a day for three weeks. Then discontinue Aspirin. You may resume your vitamins/supplements once you have discontinued the Xarelto. Do not take any NSAIDs (Advil, Aleve, Ibuprofen, Meloxicam, etc.) until you have discontinued the Xarelto.   GABAPENTIN INSTRUCTIONS Take a 300 mg capsule three times a day for two weeks following surgery.Then take a 300 mg capsule two times a day for two weeks. Then take a 300 mg capsule once a day for two weeks. Then discontinue.  HOME CARE INSTRUCTIONS  Remove items at home which could result in a fall. This includes throw rugs or furniture in walking pathways.  ICE to the affected knee as much as tolerated. Icing helps control swelling. If the swelling is well controlled you will be more comfortable and rehab easier. Continue to use ice on the knee for pain and swelling from surgery. You may notice swelling that will progress down to the foot and ankle. This is normal after surgery. Elevate the leg when you are not up walking on it.    Continue to use the breathing machine which will help keep your temperature down. It is common  for your temperature to cycle up and down following surgery, especially at night when you are not up moving around and exerting yourself. The breathing machine keeps your lungs expanded and your temperature down. Do not place pillow under the operative knee, focus on keeping the knee straight while resting  DIET You may resume your previous home diet once you are discharged from the hospital.  DRESSING / WOUND CARE / SHOWERING Keep your bulky bandage on for 2 days. On the third post-operative day you may remove the Ace bandage and gauze. There is a waterproof adhesive bandage on your skin which will stay in place until your first follow-up appointment. Once you remove this you will not need to place another bandage You may begin showering 3 days following surgery, but do not submerge the incision under water.  ACTIVITY For the first 5 days, the key is rest and control of pain and swelling Do your home exercises twice a day starting on post-operative day 3. On the days you go to physical therapy, just do the home exercises once that day. You should rest, ice and elevate the leg for 50 minutes out of every hour. Get up and walk/stretch for 10 minutes per hour. After 5 days you can increase your activity slowly as tolerated. Walk with your walker as instructed. Use the walker until you are comfortable transitioning to a cane. Walk with the cane in the opposite hand of the operative leg. You may discontinue the cane once you are comfortable and walking steadily. Avoid periods of inactivity such as sitting longer than  an hour when not asleep. This helps prevent blood clots.  You may discontinue the knee immobilizer once you are able to perform a straight leg raise while lying down. You may resume a sexual relationship in one month or when given the OK by your doctor.  You may return to work once you are cleared by your doctor.  Do not drive a car for 6 weeks or until released by your surgeon.  Do not  drive while taking narcotics.  TED HOSE STOCKINGS Wear the elastic stockings on both legs for three weeks following surgery during the day. You may remove them at night for sleeping.  WEIGHT BEARING Weight bearing as tolerated with assist device (walker, cane, etc) as directed, use it as long as suggested by your surgeon or therapist, typically at least 4-6 weeks.  POSTOPERATIVE CONSTIPATION PROTOCOL Constipation - defined medically as fewer than three stools per week and severe constipation as less than one stool per week.  One of the most common issues patients have following surgery is constipation.  Even if you have a regular bowel pattern at home, your normal regimen is likely to be disrupted due to multiple reasons following surgery.  Combination of anesthesia, postoperative narcotics, change in appetite and fluid intake all can affect your bowels.  In order to avoid complications following surgery, here are some recommendations in order to help you during your recovery period.  Colace (docusate) - Pick up an over-the-counter form of Colace or another stool softener and take twice a day as long as you are requiring postoperative pain medications.  Take with a full glass of water daily.  If you experience loose stools or diarrhea, hold the colace until you stool forms back up. If your symptoms do not get better within 1 week or if they get worse, check with your doctor. Dulcolax (bisacodyl) - Pick up over-the-counter and take as directed by the product packaging as needed to assist with the movement of your bowels.  Take with a full glass of water.  Use this product as needed if not relieved by Colace only.  MiraLax (polyethylene glycol) - Pick up over-the-counter to have on hand. MiraLax is a solution that will increase the amount of water in your bowels to assist with bowel movements.  Take as directed and can mix with a glass of water, juice, soda, coffee, or tea. Take if you go more than two  days without a movement. Do not use MiraLax more than once per day. Call your doctor if you are still constipated or irregular after using this medication for 7 days in a row.  If you continue to have problems with postoperative constipation, please contact the office for further assistance and recommendations.  If you experience "the worst abdominal pain ever" or develop nausea or vomiting, please contact the office immediatly for further recommendations for treatment.  ITCHING If you experience itching with your medications, try taking only a single pain pill, or even half a pain pill at a time.  You can also use Benadryl over the counter for itching or also to help with sleep.   MEDICATIONS See your medication summary on the "After Visit Summary" that the nursing staff will review with you prior to discharge.  You may have some home medications which will be placed on hold until you complete the course of blood thinner medication.  It is important for you to complete the blood thinner medication as prescribed by your surgeon.  Continue your approved  medications as instructed at time of discharge.  PRECAUTIONS If you experience chest pain or shortness of breath - call 911 immediately for transfer to the hospital emergency department.  If you develop a fever greater that 101 F, purulent drainage from wound, increased redness or drainage from wound, foul odor from the wound/dressing, or calf pain - CONTACT YOUR SURGEON.                                                   FOLLOW-UP APPOINTMENTS Make sure you keep all of your appointments after your operation with your surgeon and caregivers. You should call the office at the above phone number and make an appointment for approximately two weeks after the date of your surgery or on the date instructed by your surgeon outlined in the "After Visit Summary".  RANGE OF MOTION AND STRENGTHENING EXERCISES  Rehabilitation of the knee is important following a  knee injury or an operation. After just a few days of immobilization, the muscles of the thigh which control the knee become weakened and shrink (atrophy). Knee exercises are designed to build up the tone and strength of the thigh muscles and to improve knee motion. Often times heat used for twenty to thirty minutes before working out will loosen up your tissues and help with improving the range of motion but do not use heat for the first two weeks following surgery. These exercises can be done on a training (exercise) mat, on the floor, on a table or on a bed. Use what ever works the best and is most comfortable for you Knee exercises include:  Leg Lifts - While your knee is still immobilized in a splint or cast, you can do straight leg raises. Lift the leg to 60 degrees, hold for 3 sec, and slowly lower the leg. Repeat 10-20 times 2-3 times daily. Perform this exercise against resistance later as your knee gets better.  Quad and Hamstring Sets - Tighten up the muscle on the front of the thigh (Quad) and hold for 5-10 sec. Repeat this 10-20 times hourly. Hamstring sets are done by pushing the foot backward against an object and holding for 5-10 sec. Repeat as with quad sets.  Leg Slides: Lying on your back, slowly slide your foot toward your buttocks, bending your knee up off the floor (only go as far as is comfortable). Then slowly slide your foot back down until your leg is flat on the floor again. Angel Wings: Lying on your back spread your legs to the side as far apart as you can without causing discomfort.  A rehabilitation program following serious knee injuries can speed recovery and prevent re-injury in the future due to weakened muscles. Contact your doctor or a physical therapist for more information on knee rehabilitation.   POST-OPERATIVE OPIOID TAPER INSTRUCTIONS: It is important to wean off of your opioid medication as soon as possible. If you do not need pain medication after your surgery it  is ok to stop day one. Opioids include: Codeine, Hydrocodone(Norco, Vicodin), Oxycodone(Percocet, oxycontin) and hydromorphone amongst others.  Long term and even short term use of opiods can cause: Increased pain response Dependence Constipation Depression Respiratory depression And more.  Withdrawal symptoms can include Flu like symptoms Nausea, vomiting And more Techniques to manage these symptoms Hydrate well Eat regular healthy meals Stay active Use  relaxation techniques(deep breathing, meditating, yoga) Do Not substitute Alcohol to help with tapering If you have been on opioids for less than two weeks and do not have pain than it is ok to stop all together.  Plan to wean off of opioids This plan should start within one week post op of your joint replacement. Maintain the same interval or time between taking each dose and first decrease the dose.  Cut the total daily intake of opioids by one tablet each day Next start to increase the time between doses. The last dose that should be eliminated is the evening dose.   IF YOU ARE TRANSFERRED TO A SKILLED REHAB FACILITY If the patient is transferred to a skilled rehab facility following release from the hospital, a list of the current medications will be sent to the facility for the patient to continue.  When discharged from the skilled rehab facility, please have the facility set up the patient's [place] prior to being released. Also, the skilled facility will be responsible for providing the patient with their medications at time of release from the facility to include their pain medication, the muscle relaxants, and their blood thinner medication. If the patient is still at the rehab facility at time of the two week follow up appointment, the skilled rehab facility will also need to assist the patient in arranging follow up appointment in our office and any transportation needs.  MAKE SURE YOU:  Understand  these instructions.  Get help right away if you are not doing well or get worse.   DENTAL ANTIBIOTICS:  In most cases prophylactic antibiotics for Dental procdeures after total joint surgery are not necessary.  Exceptions are as follows:  1. History of prior total joint infection  2. Severely immunocompromised (Organ Transplant, cancer chemotherapy, Rheumatoid biologic medications such as [place])  3. Poorly controlled diabetes (A1C &gt; 8.0, blood glucose over 200)  If you have one of these conditions, contact your surgeon for an antibiotic prescription, prior to your dental procedure.    Pick up stool softner and laxative for home use following surgery while on pain medications. Do not submerge incision under water. Please use good hand washing techniques while changing dressing each day. May shower starting three days after surgery. Please use a clean towel to pat the incision dry following showers. Continue to use ice for pain and swelling after surgery. Do not use any lotions or creams on the incision until instructed by your surgeon.   Information on my medicine - XARELTO (Rivaroxaban)  This medication education was reviewed with me or my healthcare representative as part of my discharge preparation.  The pharmacist that spoke with me during my hospital stay was:    Why was Xarelto prescribed for you? Xarelto was prescribed for you to reduce the risk of blood clots forming after orthopedic surgery. The medical term for these abnormal blood clots is venous thromboembolism (VTE).  What do you need to know about xarelto ? Take your Xarelto ONCE DAILY at the same time every day. You may take it either with or without food.  If you have difficulty swallowing the tablet whole, you may crush it and mix in applesauce just prior to taking your dose.  Take Xarelto exactly as prescribed by your doctor and DO NOT stop taking Xarelto without talking to the doctor who prescribed  the medication.  Stopping without other VTE prevention medication to take the place of Xarelto may increase your risk of developing  a clot.  After discharge, you should have regular check-up appointments with your healthcare provider that is prescribing your Xarelto.    What do you do if you miss a dose? If you miss a dose, take it as soon as you remember on the same day then continue your regularly scheduled once daily regimen the next day. Do not take two doses of Xarelto on the same day.   Important Safety Information A possible side effect of Xarelto is bleeding. You should call your healthcare provider right away if you experience any of the following: Bleeding from an injury or your nose that does not stop. Unusual colored urine (red or dark brown) or unusual colored stools (red or black). Unusual bruising for unknown reasons. A serious fall or if you hit your head (even if there is no bleeding).  Some medicines may interact with Xarelto and might increase your risk of bleeding while on Xarelto. To help avoid this, consult your healthcare provider or pharmacist prior to using any new prescription or non-prescription medications, including herbals, vitamins, non-steroidal anti-inflammatory drugs (NSAIDs) and supplements.  This website has more information on Xarelto: [email or web address]

## 2022-06-25 NOTE — Progress Notes (Signed)
Orthopedic Tech Progress Note Patient Details:  [name] [name] 08-Sep-1960 [id number]  Patient ID: [name] [name], female   DOB: 1960-09-01, 63 y.o.   MRN: [id number]  Kennis Carina 06/25/2022, 1:53 PM Cpm applied in pacu

## 2022-06-25 NOTE — Transfer of Care (Signed)
Immediate Anesthesia Transfer of Care Note  Patient: [name] [name]  Procedure(s) Performed: TOTAL KNEE REVISION (Right: Knee)  Patient Location: PACU  Anesthesia Type:Spinal and MAC combined with regional for post-op pain  Level of Consciousness: awake and patient cooperative  Airway & Oxygen Therapy: Patient Spontanous Breathing and Patient connected to face mask oxygen  Post-op Assessment: Report given to RN and Post -op Vital signs reviewed and stable  Post vital signs: Reviewed and stable  Last Vitals:  Vitals Value Taken Time  BP 94/55 06/25/22 1326  Temp    Pulse 85 06/25/22 1330  Resp 21 06/25/22 1330  SpO2 96 % 06/25/22 1330  Vitals shown include unvalidated device data.  Last Pain:  Vitals:   06/25/22 1030  TempSrc:   PainSc: 0-No pain         Complications: No notable events documented.

## 2022-06-25 NOTE — Evaluation (Signed)
Physical Therapy Evaluation Patient Details Name: [name] [name] MRN: [id number] DOB: [birth year] Today's Date: 06/25/2022  History of Present Illness  Pt is 62 yo female s/p R TKA revision on 06/25/22.  Pt with hx including but not limited to RA, R TKA 1996, L TKA 1989 with revision 11/2021, C spine fusion, and prior foot surgeries  Clinical Impression  Pt is s/p TKA revision resulting in the deficits listed below (see PT Problem List). At baseline pt is independent but was requiring use of cane due to knee pain and instability.  She lives alone but son will be staying with her at d/c.  Today, pt was limited by pain.  She was agreeable to try OOB activity, walked 8' with RW and min A, positioned in chair but then pain increased.  Attempted repositioning but pt unable to tolerate and was returned to bed.  RN in to give pain meds.   Pt will benefit from skilled PT to increase their independence and safety with mobility to allow discharge to the venue listed below.         Recommendations for follow up therapy are one component of a multi-disciplinary discharge planning process, led by the attending physician.  Recommendations may be updated based on patient status, additional functional criteria and insurance authorization.  Follow Up Recommendations Follow physician's recommendations for discharge plan and follow up therapies      Assistance Recommended at Discharge Frequent or constant Supervision/Assistance  Patient can return home with the following  A lot of help with walking and/or transfers;A lot of help with bathing/dressing/bathroom;Assistance with cooking/housework;Help with stairs or ramp for entrance    Equipment Recommendations BSC/3in1 (pt requesting due to difficulty standing without UE support)  Recommendations for Other Services       Functional Status Assessment Patient has had a recent decline in their functional status and demonstrates the ability to make significant  improvements in function in a reasonable and predictable amount of time.     Precautions / Restrictions Precautions Precautions: Fall Required Braces or Orthoses: Knee Immobilizer - Right Knee Immobilizer - Right: Discontinue once straight leg raise with < 10 degree lag Restrictions Weight Bearing Restrictions: Yes RLE Weight Bearing: Weight bearing as tolerated      Mobility  Bed Mobility Overal bed mobility: Needs Assistance Bed Mobility: Supine to Sit, Sit to Supine     Supine to sit: Min assist Sit to supine: Min assist   General bed mobility comments: Increased time with cues and min A for R LE    Transfers Overall transfer level: Needs assistance Equipment used: Rolling walker (2 wheels) Transfers: Sit to/from Stand Sit to Stand: Min assist, From elevated surface           General transfer comment: Stood from elevated bed with cues for hand placement and R LE managment and min A.  Pt ambulated and positioned in chair but pain increasing and pt tearful and wanting to return to bed.  Cues and min A to rise from chair and for steps back to bed.    Ambulation/Gait Ambulation/Gait assistance: Min assist Gait Distance (Feet): 8 Feet Assistive device: Rolling walker (2 wheels) Gait Pattern/deviations: Step-to pattern, Decreased stride length, Decreased weight shift to right, Antalgic Gait velocity: decreased     General Gait Details: Cues for RW use and sequencing; limited distance due to pain  Stairs            Wheelchair Mobility    Modified Rankin (Stroke Patients Only)  Balance Overall balance assessment: Needs assistance Sitting-balance support: No upper extremity supported Sitting balance-Leahy Scale: Good     Standing balance support: Bilateral upper extremity supported, Reliant on assistive device for balance Standing balance-Leahy Scale: Poor Standing balance comment: RW and min guard/A                              Pertinent Vitals/Pain Pain Assessment Pain Assessment: 0-10 Pain Score: 9  (7/10 pre and 9/10 post) Pain Location: R knee Pain Descriptors / Indicators: Aching Pain Intervention(s): Limited activity within patient's tolerance, Monitored during session, Premedicated before session, Patient requesting pain meds-RN notified, RN gave pain meds during session, Repositioned, Ice applied (limited ambulation to tolerance, positioned in chair but pain worsened and pt tearful and restless , RN gave meds, PT assisted in returning pt to bed)    Home Living Family/patient expects to be discharged to:: Private residence Living Arrangements: Alone Available Help at Discharge: Family;Available 24 hours/day (son coming to stay with her) Type of Home: House Home Access: Stairs to enter Entrance Stairs-Rails: Left Entrance Stairs-Number of Steps: Curb then 2 steps Alternate Level Stairs-Number of Steps: has stair lift Home Layout: Two level;1/2 bath on main level Home Equipment: Advice worker (2 wheels);Cane - single point      Prior Function               Mobility Comments: Could ambulate in community but was difficult; was having to use cane in the house and RW for longer distances ADLs Comments: Independent with ADLs and IADLs     Hand Dominance        Extremity/Trunk Assessment   Upper Extremity Assessment Upper Extremity Assessment: Overall WFL for tasks assessed (Pt with deficits from RA in hands but functional)    Lower Extremity Assessment Lower Extremity Assessment: LLE deficits/detail;RLE deficits/detail RLE Deficits / Details: Expected post op changes; ROM : knee ~5 to 80 degrees; MMT 5/5 ankle and 3/5 hip and knee not further tested LLE Deficits / Details: ROM WFL; MMT 5/5    Cervical / Trunk Assessment Cervical / Trunk Assessment: Normal  Communication   Communication: No difficulties  Cognition Arousal/Alertness: Awake/alert Behavior During Therapy:  WFL for tasks assessed/performed Overall Cognitive Status: Within Functional Limits for tasks assessed                                          General Comments      Exercises     Assessment/Plan    PT Assessment Patient needs continued PT services  PT Problem List Decreased strength;Pain;Decreased range of motion;Decreased activity tolerance;Decreased balance;Decreased mobility;Decreased knowledge of precautions;Decreased safety awareness       PT Treatment Interventions DME instruction;Therapeutic exercise;Gait training;Stair training;Functional mobility training;Therapeutic activities;Patient/family education;Modalities;Balance training    PT Goals (Current goals can be found in the Care Plan section)  Acute Rehab PT Goals Patient Stated Goal: decrease pain PT Goal Formulation: With patient Time For Goal Achievement: 07/09/22 Potential to Achieve Goals: Good    Frequency 7X/week     Co-evaluation               AM-PAC PT "6 Clicks" Mobility  Outcome Measure Help needed turning from your back to your side while in a flat bed without using bedrails?: A Little Help needed moving from lying on your back to  sitting on the side of a flat bed without using bedrails?: A Little Help needed moving to and from a bed to a chair (including a wheelchair)?: A Little Help needed standing up from a chair using your arms (e.g., wheelchair or bedside chair)?: A Little Help needed to walk in hospital room?: Total Help needed climbing 3-5 steps with a railing? : Total 6 Click Score: 14    End of Session Equipment Utilized During Treatment: Gait belt Activity Tolerance: Patient limited by pain Patient left: in bed;with call bell/phone within reach;with bed alarm set;with SCD's reapplied Nurse Communication: Mobility status;Patient requests pain meds PT Visit Diagnosis: Other abnormalities of gait and mobility (R26.89);Muscle weakness (generalized) (M62.81)     Time: [id number] PT Time Calculation (min) (ACUTE ONLY): 43 min   Charges:   PT Evaluation $PT Eval Low Complexity: 1 Low PT Treatments $Gait Training: 8-22 mins $Therapeutic Activity: 8-22 mins        Abran Richard, PT Acute Rehab [facility] [phone]   Karlton Lemon 06/25/2022, 5:21 PM

## 2022-06-25 NOTE — Anesthesia Postprocedure Evaluation (Signed)
Anesthesia Post Note  Patient: Soil scientist  Procedure(s) Performed: TOTAL KNEE REVISION (Right: Knee)     Patient location during evaluation: PACU Anesthesia Type: Regional, MAC and Spinal Level of consciousness: awake and alert and oriented Pain management: pain level controlled Vital Signs Assessment: post-procedure vital signs reviewed and stable Respiratory status: spontaneous breathing, nonlabored ventilation and respiratory function stable Cardiovascular status: blood pressure returned to baseline and stable Postop Assessment: no headache, no backache, spinal receding and patient able to bend at knees Anesthetic complications: no   No notable events documented.  Last Vitals:  Vitals:   06/25/22 1500 06/25/22 1517  BP: (!) 112/91 132/74  Pulse: 62 (!) 58  Resp: 20 16  Temp:    SpO2: 100% 100%    Last Pain:  Vitals:   06/25/22 1517  TempSrc:   PainSc: [place]

## 2022-06-25 NOTE — Op Note (Signed)
NAME: [name] [name], [name] [name]. MEDICAL RECORD NO: [id number] ACCOUNT NO: [id number] DATE OF BIRTH: 1960-12-30 FACILITY: [facility] LOCATION: [facility] PHYSICIAN: Dione Plover. Jerian Morais, MD  Operative Report   DATE OF PROCEDURE: 06/25/2022  PREOPERATIVE DIAGNOSIS:  Failed right total knee arthroplasty.  POSTOPERATIVE DIAGNOSIS:  Failed right total knee arthroplasty.  PROCEDURE:  Right total knee arthroplasty revision.  SURGEON:  Dione Plover. Callyn Severtson, MD  ASSISTANT:  Theresa Duty, PA-C.  ANESTHESIA:  Adductor canal block and spinal.  ESTIMATED BLOOD LOSS:  75 mL.  DRAINS:  None.  TOURNIQUET TIME:  Up 36 minutes at 300 mmHg, down 8 minutes, up additional 24 minutes at 300 mmHg.  COMPLICATIONS:  None.  CONDITION:  Stable to recovery.  BRIEF CLINICAL NOTE:  The patient is a 62 year old female who had a total knee arthroplasty performed on the right approximately 20 years ago in [place].  She recently has developed significant pain and instability in the knee.  Her radiographs showed  a subluxation of the tibial component on the femur.  She had gross instability on exam.  She presents now for total knee arthroplasty revision.  DESCRIPTION OF PROCEDURE:  After successful administration of adductor canal block and spinal anesthetic, a tourniquet was placed on her right thigh and her right lower extremity was prepped and draped in the usual sterile fashion.  Extremity was wrapped  in Esmarch, tourniquet inflated to 300 mmHg.  Midline incision was made with a 10 blade through subcutaneous tissue to the level of the extensor mechanism.  A fresh blade was used to make a medial parapatellar arthrotomy.  We did not encounter any fluid  in the joint.  Soft tissue on the proximal medial tibia subperiosteally elevated the joint line with a knife and into the semimembranosus bursa with a Cobb elevator.  Soft tissue laterally was elevated with attention being paid to avoid the patellar  tendon on tibial tubercle.   The patella was everted, knee flexed 90 degrees.  There was a fair amount of metal staining of the tissue and dead tissue was removed.  Following flexed knee 90 degrees the tibia subluxed forward and made it much easier to  remove the polyethylene from the tibial tray.  Circumferential retraction was then placed around the tibia with the tibia subluxed forward for excellent exposure.  I used the saw to disrupt the interface between the tibial component and bone and then I  used an osteotome to further disrupt the interface around the circumference of the tibial component.  I then used a bone tamp to try and extract the component from the bone and with the bone tamp the component came out easily with no bone loss.  We then  reamed the tibial canal up to a 14 for 14 press-fit stem, which was a 14 x 60.  The extramedullary tibial alignment guide was placed, referencing proximally at the medial aspect of the tibial tubercle and distally along the second metatarsal axis and  tibial crest.  Blocks pinned to remove about 2 mm from the previous resection level.  Resection was made with an oscillating saw.  Size 3 was the most appropriate tibial component for the Attune system.  We did a proximal tibial reaming and then did a  preparation for the 29 sleeve.  I did a 29 broaching for the 29 sleeve.  We then addressed the femur.  We disrupted the interface between the femoral component and bone with osteotomes and easily removed the femoral component with no bone loss.  There  was some membrane present over the bone and that is removed.  Drill was  used to create a starting hole in the distal femur and the canal was thoroughly irrigated.  Reamed up to 14 mm for an excellent press-fit in the femur also.  Reamer was left in as our intramedullary alignment guide and distal femoral cutting block is  placed.  Resection was made with an oscillating saw.  The sized it 4 mm augments medial and lateral distally to get the joint  line back to its more normal position.  Size 3 was most appropriate for the femoral component.  The size 3 cutting blocks placed  on a 14 x 110 stem extension.  Rotation of the cutting block is marked off the epicondylar axis confirmed by creating a rectangular flexion gap at 90 degrees of flexion.  Block is pinned in this position.  Essentially had a minimal bone resected  posteriorly.  No bone resection anteriorly.  I decided to go with 4 mm augments posteriorly.  There was no bony cut for the chamfers.  Intercondylar block was placed and intercondylar cut made.  We then placed the trials on the femoral side, the trial  size 3 Attune revision femur with 4 mm distal augments medial and lateral 4 mm posterior augments medial and lateral.  On the tibial sides a size 3 Attune revision tray with 10 mm augments medial and lateral, a 29 sleeve and a 14 x 60 stem.  Trials were  placed and with a 14 mm insert, full extension was achieved with excellent varus, valgus, and anterior, posterior balance throughout full range of motion.  Her patella had never been resurfaced in the past.  There was essentially no cartilage left on the  patella.  We measured the thickness to be 22, did freehand resection at 12 mm, 35 template is placed, lug holes were drilled.  Trial patella was placed and tracks normally.  The tourniquet was then released for initial tourniquet time of 36 minutes.  It  was held down for 8 minutes while the revision components were assembled on the back table.  After 8 minutes, we rewrapped the leg in Esmarch and inflated tourniquet 300 mmHg.  We removed the trial implants and then the cut bone surfaces were thoroughly  prepared with pulsatile lavage.  Two batches of gentamicin impregnated cement were mixed and once ready for implantation on the tibial side, it was a press-fit stem and porous coated sleeve, so we just cemented at the resection level.  The tibial  component impacted and extruded cement  removed.  On the femoral side, we cemented distally and had a press-fit stem.  We impacted and all extruded cement was removed.  Sizers were the same as they were for the trial.  A 14 mm trial insert was placed.   Knee held in full extension and all extruded cement removed.  35 patella cemented and held with a clamp.  When the cement had fully hardened, then the permanent 14 mm Attune revision posterior stabilized rotating platform components were placed.  The  knee was reduced with outstanding stability throughout full range of motion.  Wound was copiously irrigated with saline solution.  20 mL of Exparel mixed with 60 mL of saline was injected into the extensor mechanism posterior capsule and periosteum of  the femur.  Further irrigation was performed and the knee arthrotomy was closed with a running 0 Stratafix suture.  Tourniquet was released for the second time of 24  minutes.  Total tourniquet time between the two was of 60 minutes.  The subcutaneous was  then closed with interrupted 2-0 Vicryl, subcuticular running 4-0 Monocryl.  Incisions cleaned and dried.  Steri-Strips, sterile dressing applied.  She was then placed into a knee immobilizer, awakened, and transported to recovery in stable condition.  Please note that a surgical assistant was of medical necessity for this procedure to do it in a safe and expeditious manner.  Surgical assistant was necessary for retraction of vital ligaments and neurovascular structures and for proper positioning of  the limb for safe and easy removal of the old implant and safe and accurate placement of the new implant.   PUS D: 06/25/2022 1:02:55 pm T: 06/25/2022 1:28:00 pm  JOB: [id number]

## 2022-06-25 NOTE — Progress Notes (Signed)
Unable to reach patient about time change for surgery.  Left Voice mail for patient to return call   Jasmine Pang BSN, RN Engineer, site - Perioperative Services [facility] [phone]

## 2022-06-25 NOTE — Interval H&P Note (Signed)
History and Physical Interval Note:  06/25/2022 11:04 AM  [place]  has presented today for surgery, with the diagnosis of Failed Right total knee arthroplasty.  The various methods of treatment have been discussed with the patient and family. After consideration of risks, benefits and other options for treatment, the patient has consented to  Procedure(s): TOTAL KNEE REVISION (Right) as a surgical intervention.  The patient's history has been reviewed, patient examined, no change in status, stable for surgery.  I have reviewed the patient's chart and labs.  Questions were answered to the patient's satisfaction.     Pilar Plate Marylou Wages

## 2022-06-25 NOTE — Brief Op Note (Signed)
06/25/2022  12:53 PM  PATIENT:  [name] [name]  62 y.o. female  PRE-OPERATIVE DIAGNOSIS:  Failed Right total knee arthroplasty  POST-OPERATIVE DIAGNOSIS:  Failed Right total knee arthroplasty  PROCEDURE:  Procedure(s): TOTAL KNEE REVISION (Right)  SURGEON:  Surgeon(s) and Role:    Gaynelle Arabian, MD - Primary  PHYSICIAN ASSISTANT:   ASSISTANTS: Theresa Duty, PA-C   ANESTHESIA:   regional and spinal  EBL:  75 mL   BLOOD ADMINISTERED:none  DRAINS: none   LOCAL MEDICATIONS USED:  OTHER Exparel  COUNTS:  YES  TOURNIQUET:   Total Tourniquet Time Documented: Thigh (Right) - 37 minutes Thigh (Right) - 22 minutes Total: Thigh (Right) - 59 minutes   DICTATION: .Other Dictation: Dictation Number [id number]  PLAN OF CARE: Admit to inpatient   PATIENT DISPOSITION:  PACU - hemodynamically stable.

## 2022-06-26 ENCOUNTER — Encounter (HOSPITAL_COMMUNITY): Payer: Self-pay | Admitting: Orthopedic Surgery

## 2022-06-26 LAB — CBC
HCT: 35.8 % — ABNORMAL LOW (ref 36.0–46.0)
Hemoglobin: 11.3 g/dL — ABNORMAL LOW (ref 12.0–15.0)
MCH: 28.2 pg (ref 26.0–34.0)
MCHC: 31.6 g/dL (ref 30.0–36.0)
MCV: 89.3 fL (ref 80.0–100.0)
Platelets: 266 10*3/uL (ref 150–400)
RBC: 4.01 MIL/uL (ref 3.87–5.11)
RDW: 15 % (ref 11.5–15.5)
WBC: 12.4 10*3/uL — ABNORMAL HIGH (ref 4.0–10.5)
nRBC: 0 % (ref 0.0–0.2)

## 2022-06-26 LAB — BASIC METABOLIC PANEL
Anion gap: 6 (ref 5–15)
BUN: 14 mg/dL (ref 6–20)
CO2: 25 mmol/L (ref 22–32)
Calcium: 8.6 mg/dL — ABNORMAL LOW (ref 8.9–10.3)
Chloride: 105 mmol/L (ref 98–111)
Creatinine, Ser: 0.77 mg/dL (ref 0.44–1.00)
GFR, Estimated: >60 mL/min (ref >60)
Glucose, Bld: 164 mg/dL — ABNORMAL HIGH (ref 70–99)
Potassium: 4.1 mmol/L (ref 3.5–5.1)
Sodium: 136 mmol/L (ref 135–145)

## 2022-06-26 NOTE — Plan of Care (Signed)
Plan of care reviewed and discussed. Problem: Education: Goal: Knowledge of the prescribed therapeutic regimen will improve Outcome: Progressing   Problem: Activity: Goal: Ability to avoid complications of mobility impairment will improve Outcome: Progressing Goal: Range of joint motion will improve Outcome: Adequate for Discharge   Problem: Clinical Measurements: Goal: Postoperative complications will be avoided or minimized Outcome: Progressing   Problem: Pain Management: Goal: Pain level will decrease with appropriate interventions Outcome: Progressing   Problem: Skin Integrity: Goal: Will show signs of wound healing Outcome: Progressing

## 2022-06-26 NOTE — Progress Notes (Signed)
   Subjective: 1 Day Post-Op Procedure(s) (LRB): TOTAL KNEE REVISION (Right) Patient reports pain as moderate.   Patient seen in rounds by Dr. Wynelle Link. Patient is well, and has had no acute complaints or problems other than pain in the right knee. Denies chest pain or SOB. Foley catheter removed this AM. We will continue therapy today, ambulated 8' yesterday.  Objective: Vital signs in last 24 hours: Temp:  [97.5 F (36.4 C)-98.3 F (36.8 C)] 98.3 F (36.8 C) (03/21 0612) Pulse Rate:  [56-91] 69 (03/21 0612) Resp:  [12-26] 16 (03/21 0612) BP: (95-167)/(57-95) 115/64 (03/21 0612) SpO2:  [92 %-100 %] 92 % (03/21 0612) Weight:  [73.5 kg] 73.5 kg (03/20 1001)  Intake/Output from previous day:  Intake/Output Summary (Last 24 hours) at 06/26/2022 0751 Last data filed at 06/26/2022 0600 Gross per 24 hour  Intake 3908.07 ml  Output 2400 ml  Net 1508.07 ml     Intake/Output this shift: No intake/output data recorded.  Labs: Recent Labs    06/26/22 0336  HGB 11.3*   Recent Labs    06/26/22 0336  WBC 12.4*  RBC 4.01  HCT 35.8*  PLT 266   Recent Labs    06/26/22 0336  NA 136  K 4.1  CL 105  CO2 25  BUN 14  CREATININE 0.77  GLUCOSE 164*  CALCIUM 8.6*   No results for input(s): "LABPT", "INR" in the last 72 hours.  Exam: General - Patient is Alert and Oriented Extremity - Neurologically intact Neurovascular intact Sensation intact distally Dorsiflexion/Plantar flexion intact Dressing - dressing C/D/I Motor Function - intact, moving foot and toes well on exam.   Past Medical History:  Diagnosis Date   Complication of anesthesia    "irregular heartbeat" during neck surgery   Depression    Fibroid    Rheumatoid arthritis (HCC)    Urinary incontinence     Assessment/Plan: 1 Day Post-Op Procedure(s) (LRB): TOTAL KNEE REVISION (Right) Principal Problem:   Failed total knee arthroplasty (HCC)  Estimated body mass index is 29.63 kg/m as calculated from  the following:   Height as of this encounter: 5\' 2"  (1.575 m).   Weight as of this encounter: 73.5 kg. Advance diet Up with therapy  DVT Prophylaxis - Xarelto Weight bearing as tolerated. Continue therapy.  Plan is to go Home after hospital stay. Patient has limited help at home, so will need to stay until tomorrow to maximize mobility prior to discharge. Will plan to go home tomorrow pending PT clearance.  Theresa Duty, PA-C Orthopedic Surgery [phone] 06/26/2022, 7:51 AM

## 2022-06-26 NOTE — TOC Transition Note (Addendum)
Transition of Care [facility]) - CM/SW Discharge Note  Patient Details  Name: [name] [name] MRN: [id number] Date of Birth: 03-Dec-1960  Transition of [facility]) CM/SW Contact:  Sherie Don, LCSW Phone Number: 06/26/2022, 2:33 PM  Clinical Narrative: Patient is expected to discharge home after working with PT.  CSW met with patient to confirm discharge plan and needs. Patient will need HHPT set up and requested [facility]. CSW made HHPT referral to Cindie with [facility], which was accepted. Patient has a rolling walker, but will need a 3N1 which she private paid for through [facility]. Patient will need HHPT orders prior to discharge. Patient agreeable to food pantry resources being added to AVS. CSW added resources to AVA.  Final next level of care: Home w Home Health Services Barriers to Discharge: No Barriers Identified  Patient Goals and CMS Choice CMS Medicare.gov Compare Post Acute Care list provided to:: Patient Choice offered to / list presented to : Patient  Discharge Plan and Services Additional resources added to the After Visit Summary for          DME Arranged: 3-N-1 DME Agency: [employer] Date DME Agency Contacted: 06/26/22 Representative spoke with at DME Agency: Verline Lema HH Arranged: PT [place]: [place] Date [place]: 06/26/22 Representative spoke with at [place]: Cindie  Social Determinants of Health ([place]) Interventions SDOH Screenings   Food Insecurity: Food Insecurity Present (06/25/2022)  Housing: Low Risk  (06/25/2022)  Transportation Needs: No Transportation Needs (06/25/2022)  Utilities: Not At Risk (06/25/2022)  Tobacco Use: Low Risk  (06/25/2022)   Readmission Risk Interventions     No data to display

## 2022-06-26 NOTE — Progress Notes (Cosign Needed)
  HPI: Patient is a 62 year old female who is POD-1 from a s/p right total knee revision  Patient seen by Selinda Michaels PA-S with Dr. Wynelle Link  Patient is currently lying comfortably in bed and states that she slept well overnight. She reports that her pain is currently well controled with pain medication. Patient ambulated 8 feet with PT yesterday with a RW and minimal assistance.Ice packs are in place. Foley catheter was removed this AM. Patient denies chest pain, abdominal pain and SHOB. No acute events overnight   Vitals reviewed I/O reviewed  Labs reviewed Medications Reviewed PMH Reviewed Images reviewed  Physical Exam:  General: well appearing female who is alert, cooperative, pleasant and in NAD Skin: warm, dry and intact Resp: Normal effort of respiration, no signs of respiratory distress MSK: right lower extremity is warm but not erythematous, there is minimal swelling of the right lower extremity. There is no ecchymosis. Compartments are soft. Dressing is C/D/I. Patient is able to wiggle toes. Plantarflexion and dorsiflexion are intact.  Neurovascular: sensation and distal pulses are intact in bilateral lower extremities     Assessment: Right failed total knee arthroplasty  S/p right total knee revision   Plan: Continue to monitor vitals  Advance Diet as tolerated D/C IV fluids Continue DVT Prophylaxis: Xarelto WBAT Continue to work with PT  Follow-up in the clinic is scheduled for 2 weeks post-op with Dr. Wynelle Link  Patient to be discharged home tomorrow if goals are met with PT and pain is well controlled.  PDMP was reviewed before opioids were prescribed to patient.

## 2022-06-26 NOTE — Progress Notes (Signed)
Physical Therapy Treatment Patient Details Name: [name] [name] MRN: [id number] DOB: [birth year] Today's Date: 06/26/2022   History of Present Illness Pt is 62 yo female s/p R TKA revision on 06/25/22.  Pt with hx including but not limited to RA, R TKA 1996, L TKA 1989 with revision 11/2021, C spine fusion, and prior foot surgeries    PT Comments    Pt performed LE exercises in supine.  Pt ambulated in hallway and assisted to bathroom prior to return to bed.  Pt anticipates d/c home tomorrow.    Recommendations for follow up therapy are one component of a multi-disciplinary discharge planning process, led by the attending physician.  Recommendations may be updated based on patient status, additional functional criteria and insurance authorization.  Follow Up Recommendations  Follow physician's recommendations for discharge plan and follow up therapies     Assistance Recommended at Discharge Frequent or constant Supervision/Assistance  Patient can return home with the following Assistance with cooking/housework;Help with stairs or ramp for entrance;A little help with bathing/dressing/bathroom;A little help with walking and/or transfers   Equipment Recommendations  BSC/3in1    Recommendations for Other Services       Precautions / Restrictions Precautions Precautions: Fall;Knee Restrictions RLE Weight Bearing: Weight bearing as tolerated     Mobility  Bed Mobility Overal bed mobility: Needs Assistance Bed Mobility: Supine to Sit, Sit to Supine     Supine to sit: Min guard Sit to supine: Min guard   General bed mobility comments: Increased time and effort    Transfers Overall transfer level: Needs assistance Equipment used: Rolling walker (2 wheels) Transfers: Sit to/from Stand Sit to Stand: Min guard, Min assist           General transfer comment: verbal cues for UE and LE positioning for pain control, assist to rise    Ambulation/Gait Ambulation/Gait  assistance: Min assist, Min guard Gait Distance (Feet): 80 Feet Assistive device: Rolling walker (2 wheels) Gait Pattern/deviations: Step-to pattern, Antalgic, Decreased stance time - right Gait velocity: decreased     General Gait Details: Cues for RW positioning, step length and sequencing; occasional min assist due to sudden onset of pain   Stairs             Wheelchair Mobility    Modified Rankin (Stroke Patients Only)       Balance                                            Cognition Arousal/Alertness: Awake/alert Behavior During Therapy: WFL for tasks assessed/performed Overall Cognitive Status: Within Functional Limits for tasks assessed                                          Exercises Total Joint Exercises Ankle Circles/Pumps: AROM, Both, 10 reps Quad Sets: 10 reps, Left, AROM Heel Slides: AAROM, Left, 10 reps Hip ABduction/ADduction: AROM, Left, 10 reps Straight Leg Raises: AAROM, Left, 10 reps    General Comments        Pertinent Vitals/Pain Pain Assessment Pain Assessment: 0-10 Pain Score: 5  Pain Location: R knee Pain Descriptors / Indicators: Aching, Sore Pain Intervention(s): Repositioned, Monitored during session    Home Living  Prior Function            PT Goals (current goals can now be found in the care plan section) Progress towards PT goals: Progressing toward goals    Frequency    7X/week      PT Plan Current plan remains appropriate    Co-evaluation              AM-PAC PT "6 Clicks" Mobility   Outcome Measure  Help needed turning from your back to your side while in a flat bed without using bedrails?: A Little Help needed moving from lying on your back to sitting on the side of a flat bed without using bedrails?: A Little Help needed moving to and from a bed to a chair (including a wheelchair)?: A Little Help needed standing up from a  chair using your arms (e.g., wheelchair or bedside chair)?: A Little Help needed to walk in hospital room?: A Little Help needed climbing 3-5 steps with a railing? : A Lot 6 Click Score: 17    End of Session Equipment Utilized During Treatment: Gait belt Activity Tolerance: Patient tolerated treatment well Patient left: in bed;with call bell/phone within reach;with SCD's reapplied;with bed alarm set Nurse Communication: Mobility status PT Visit Diagnosis: Other abnormalities of gait and mobility (R26.89);Muscle weakness (generalized) (M62.81)     Time: 1342-1410 PT Time Calculation (min) (ACUTE ONLY): 28 min  Charges:  $Gait Training: 8-22 mins $Therapeutic Exercise: 8-22 mins                    Jannette Spanner PT, DPT Physical Therapist Acute Rehabilitation Services Preferred contact method: Secure Chat Weekend Pager Only: [phone] Office: [place] 06/26/2022, 4:13 PM

## 2022-06-26 NOTE — Progress Notes (Signed)
Orthopedic Tech Progress Note Patient Details:  [name] January 07, 1961 [id number]  Patient ID: [name] [name], female   DOB: May 08, 1960, 62 y.o.   MRN: [id number]  Kennis Carina 06/26/2022, 3:34 PM Cpm applied

## 2022-06-26 NOTE — Progress Notes (Signed)
Physical Therapy Treatment Patient Details Name: [name] [name] MRN: [id number] DOB: 1961/01/09 Today's Date: 06/26/2022   History of Present Illness Pt is 62 yo female s/p R TKA revision on 06/25/22.  Pt with hx including but not limited to RA, R TKA 1996, L TKA 1989 with revision 11/2021, C spine fusion, and prior foot surgeries    PT Comments    Pt in recliner on arrival.  Pt ambulated in hallway and then assisted back to bed per request.     Recommendations for follow up therapy are one component of a multi-disciplinary discharge planning process, led by the attending physician.  Recommendations may be updated based on patient status, additional functional criteria and insurance authorization.  Follow Up Recommendations  Follow physician's recommendations for discharge plan and follow up therapies     Assistance Recommended at Discharge Frequent or constant Supervision/Assistance  Patient can return home with the following Assistance with cooking/housework;Help with stairs or ramp for entrance;A little help with bathing/dressing/bathroom;A little help with walking and/or transfers   Equipment Recommendations  BSC/3in1    Recommendations for Other Services       Precautions / Restrictions Precautions Precautions: Fall;Knee Restrictions RLE Weight Bearing: Weight bearing as tolerated     Mobility  Bed Mobility Overal bed mobility: Needs Assistance Bed Mobility: Sit to Supine       Sit to supine: Supervision, HOB elevated        Transfers Overall transfer level: Needs assistance Equipment used: Rolling walker (2 wheels) Transfers: Sit to/from Stand Sit to Stand: Min guard           General transfer comment: cues for hand placement    Ambulation/Gait Ambulation/Gait assistance: Min guard Gait Distance (Feet): 140 Feet Assistive device: Rolling walker (2 wheels) Gait Pattern/deviations: Step-to pattern, Decreased stride length, Decreased weight  shift to right, Antalgic Gait velocity: decreased     General Gait Details: Cues for RW use and sequencing; distance per pt tolerance   Stairs             Wheelchair Mobility    Modified Rankin (Stroke Patients Only)       Balance                                            Cognition Arousal/Alertness: Awake/alert Behavior During Therapy: WFL for tasks assessed/performed Overall Cognitive Status: Within Functional Limits for tasks assessed                                          Exercises      General Comments        Pertinent Vitals/Pain Pain Assessment Pain Assessment: 0-10 Pain Score: 4  Pain Location: R knee Pain Descriptors / Indicators: Aching, Sore Pain Intervention(s): Monitored during session, Premedicated before session, Repositioned    Home Living                          Prior Function            PT Goals (current goals can now be found in the care plan section) Progress towards PT goals: Progressing toward goals    Frequency    7X/week      PT Plan Current plan remains appropriate  Co-evaluation              AM-PAC PT "6 Clicks" Mobility   Outcome Measure  Help needed turning from your back to your side while in a flat bed without using bedrails?: A Little Help needed moving from lying on your back to sitting on the side of a flat bed without using bedrails?: A Little Help needed moving to and from a bed to a chair (including a wheelchair)?: A Little Help needed standing up from a chair using your arms (e.g., wheelchair or bedside chair)?: A Little Help needed to walk in hospital room?: A Little Help needed climbing 3-5 steps with a railing? : A Lot 6 Click Score: 17    End of Session Equipment Utilized During Treatment: Gait belt Activity Tolerance: Patient tolerated treatment well Patient left: in bed;with call bell/phone within reach Nurse Communication: Mobility  status;Patient requests pain meds PT Visit Diagnosis: Other abnormalities of gait and mobility (R26.89);Muscle weakness (generalized) (M62.81)     Time: [id number] PT Time Calculation (min) (ACUTE ONLY): 18 min  Charges:  $Gait Training: 8-22 mins                     Jannette Spanner PT, DPT Physical Therapist Acute Rehabilitation Services Preferred contact method: Secure Chat Weekend Pager Only: [phone] Office: [place] 06/26/2022, 1:08 PM

## 2022-06-27 ENCOUNTER — Other Ambulatory Visit (HOSPITAL_COMMUNITY): Payer: Self-pay

## 2022-06-27 LAB — CBC
HCT: 33.7 % — ABNORMAL LOW (ref 36.0–46.0)
Hemoglobin: 10.6 g/dL — ABNORMAL LOW (ref 12.0–15.0)
MCH: 28.3 pg (ref 26.0–34.0)
MCHC: 31.5 g/dL (ref 30.0–36.0)
MCV: 90.1 fL (ref 80.0–100.0)
Platelets: 272 10*3/uL (ref 150–400)
RBC: 3.74 MIL/uL — ABNORMAL LOW (ref 3.87–5.11)
RDW: 15.1 % (ref 11.5–15.5)
WBC: 11.5 10*3/uL — ABNORMAL HIGH (ref 4.0–10.5)
nRBC: 0 % (ref 0.0–0.2)

## 2022-06-27 MED ORDER — RIVAROXABAN 10 MG PO TABS
10.0000 mg | ORAL_TABLET | Freq: Every day | ORAL | 0 refills | Status: DC
  Filled 2022-06-27: qty 19, 19d supply, fill #0

## 2022-06-27 MED ORDER — GABAPENTIN 300 MG PO CAPS
300.0000 mg | ORAL_CAPSULE | ORAL | 0 refills | Status: DC
  Filled 2022-06-27: qty 81, 42d supply, fill #0

## 2022-06-27 MED ORDER — OXYCODONE HCL 5 MG PO TABS
5.0000 mg | ORAL_TABLET | Freq: Four times a day (QID) | ORAL | 0 refills | Status: DC | PRN
  Filled 2022-06-27: qty 42, 6d supply, fill #0

## 2022-06-27 MED ORDER — METHOCARBAMOL 500 MG PO TABS
500.0000 mg | ORAL_TABLET | Freq: Four times a day (QID) | ORAL | 0 refills | Status: DC | PRN
  Filled 2022-06-27: qty 40, 10d supply, fill #0

## 2022-06-27 NOTE — Plan of Care (Signed)

## 2022-06-27 NOTE — Progress Notes (Signed)
Physical Therapy Treatment Patient Details Name: [name] [name] MRN: [id number] DOB: 1960/07/02 Today's Date: 06/27/2022   History of Present Illness Pt is 62 yo female s/p R TKA revision on 06/25/22.  Pt with hx including but not limited to RA, R TKA 1996, L TKA 1989 with revision 11/2021, C spine fusion, and prior foot surgeries    PT Comments    Pt reports feeling more sore today from being in CPM too long last night.  Pt required min assist to stand and also for performing stairs.  Will return to practice stairs again as pt anticipates d/c home today around lunch (for her ride).    Recommendations for follow up therapy are one component of a multi-disciplinary discharge planning process, led by the attending physician.  Recommendations may be updated based on patient status, additional functional criteria and insurance authorization.  Follow Up Recommendations  Follow physician's recommendations for discharge plan and follow up therapies     Assistance Recommended at Discharge Frequent or constant Supervision/Assistance  Patient can return home with the following Assistance with cooking/housework;Help with stairs or ramp for entrance;A little help with bathing/dressing/bathroom;A little help with walking and/or transfers   Equipment Recommendations  BSC/3in1    Recommendations for Other Services       Precautions / Restrictions Precautions Precautions: Fall;Knee Restrictions RLE Weight Bearing: Weight bearing as tolerated     Mobility  Bed Mobility               General bed mobility comments: pt in recliner    Transfers Overall transfer level: Needs assistance Equipment used: Rolling walker (2 wheels) Transfers: Sit to/from Stand Sit to Stand: Min assist           General transfer comment: verbal cues for UE and LE positioning for pain control, assist to rise    Ambulation/Gait Ambulation/Gait assistance: Min guard Gait Distance (Feet): 80  Feet Assistive device: Rolling walker (2 wheels) Gait Pattern/deviations: Step-to pattern, Antalgic, Decreased stance time - right Gait velocity: decreased     General Gait Details: Cues for RW positioning and posture   Stairs Stairs: Yes Stairs assistance: Min assist Stair Management: Step to pattern, Forwards, One rail Right, One rail Left Number of Stairs: 2 General stair comments: pt only has left rail at home and unable to pull herself up to ascend without holding other rail, min assist for weakness and stability   Wheelchair Mobility    Modified Rankin (Stroke Patients Only)       Balance                                            Cognition Arousal/Alertness: Awake/alert Behavior During Therapy: WFL for tasks assessed/performed Overall Cognitive Status: Within Functional Limits for tasks assessed                                          Exercises      General Comments        Pertinent Vitals/Pain Pain Assessment Pain Assessment: 0-10 Pain Score: 4  Pain Location: R knee Pain Descriptors / Indicators: Aching, Sore Pain Intervention(s): Repositioned, Monitored during session, Premedicated before session, Ice applied    Home Living  Prior Function            PT Goals (current goals can now be found in the care plan section) Progress towards PT goals: Progressing toward goals    Frequency    7X/week      PT Plan Current plan remains appropriate    Co-evaluation              AM-PAC PT "6 Clicks" Mobility   Outcome Measure  Help needed turning from your back to your side while in a flat bed without using bedrails?: A Little Help needed moving from lying on your back to sitting on the side of a flat bed without using bedrails?: A Little Help needed moving to and from a bed to a chair (including a wheelchair)?: A Little Help needed standing up from a chair using your  arms (e.g., wheelchair or bedside chair)?: A Little Help needed to walk in hospital room?: A Little Help needed climbing 3-5 steps with a railing? : A Lot 6 Click Score: 17    End of Session Equipment Utilized During Treatment: Gait belt Activity Tolerance: Patient tolerated treatment well Patient left: with call bell/phone within reach;in chair Nurse Communication: Mobility status PT Visit Diagnosis: Other abnormalities of gait and mobility (R26.89);Muscle weakness (generalized) (M62.81)     Time: [id number] PT Time Calculation (min) (ACUTE ONLY): 20 min  Charges:  $Gait Training: 8-22 mins                    Arlyce Dice, DPT Physical Therapist Acute Rehabilitation Services Preferred contact method: Secure Chat Weekend Pager Only: [phone] Office: [place] 06/27/2022, 1:36 PM

## 2022-06-27 NOTE — TOC Progression Note (Signed)
Transition of Care [facility]) - Progression Note   Patient Details  Name: [name] [name] MRN: [id number] Date of Birth: [birth year]  [facility]) CM/SW [place], LCSW Phone Number: 06/27/2022, 8:34 AM  Clinical Narrative: [place] orders have been placed. CSW notified Cindie with [place].    Barriers to Discharge: No Barriers Identified  Expected Discharge Plan and Services Expected Discharge Date: 06/27/22               DME Arranged: 3-N-1 DME Agency: [employer] Date DME Agency Contacted: 06/26/22 Representative spoke with at DME Agency: Verline Lema HH Arranged: PT [place]: [place] Date [place]: 06/26/22 Representative spoke with at [place]: Cindie  Social Determinants of Health ([place]) Interventions SDOH Screenings   Food Insecurity: Food Insecurity Present (06/25/2022)  Housing: Low Risk  (06/25/2022)  Transportation Needs: No Transportation Needs (06/25/2022)  Utilities: Not At Risk (06/25/2022)  Tobacco Use: Low Risk  (06/26/2022)   Readmission Risk Interventions     No data to display

## 2022-06-27 NOTE — Progress Notes (Cosign Needed)
    HPI: Patient is a 62 year old female who is POD- 2 from a s/p right total knee revision  Patient seen by Selinda Michaels PA-S with Dr. Wynelle Link   Patient is currently lying comfortably in bed and states that she slept well last night.   Patient reports that pain is currently well controled with pain medication. Patient ambulated with PT twice and was successful with ambulation in the hallway.    Teds, SCD's and ice packs are in place. ACE Bandage removed by Dr. Wynelle Link this AM.  Patient denies chest pain, abdominal pain and SHOB.      Vitals reviewed I/O reviewed Labs reviewed Medications reviewed PMH reviewed Images reviewed    Physical Exam:   General: well appearing female who is alert, cooperative, pleasant and in NAD Skin: warm, dry and intact Resp: Normal effort of respiration, no signs of respiratory distress   MSK: left lower extremity is warm but not erythematous, there is minimal swelling of the left lower extremity localized around the knee joint. There is no ecchymosis. Compartments are soft. Dressing is C/D/I. Patient is able to wiggle toes. Plantarflexion and dorsiflexion are intact.  Neurovascular: sensation and distal pulses are intact in bilateral lower extremities        Assessment: Failed Total Knee Arthroplasty S/p total knee revision ( right)   Plan: Continue to monitor vitals  Continue PT WBAT Continue DVT Prophylaxis: Xarelto HHPT scheduled  Follow-up in the clinic is scheduled for 2 weeks post-op with Dr. Wynelle Link   Patient to be discharged home today if goals are met with PT and pain is well controlled.  PDMP was reviewed before opioids were prescribed to patient.    Signed Selinda Michaels PA-S

## 2022-06-27 NOTE — Progress Notes (Signed)
Physical Therapy Treatment Patient Details Name: [name] [name] MRN: [id number] DOB: 11/08/1960 Today's Date: 06/27/2022   History of Present Illness Pt is 62 yo female s/p R TKA revision on 06/25/22.  Pt with hx including but not limited to RA, R TKA 1996, L TKA 1989 with revision 11/2021, C spine fusion, and prior foot surgeries    PT Comments    Pt assisted with donning shoes and then into bathroom.  Pt requiring assist for shoes and sit to stands.  Pt attempted to perform stairs however became anxious, had pain, and required +2 assist to safely return to recliner (RN and NT also came to assist).  Pt reports railing felt too slick but she still wanted to go home today.  Pt taken to another set of steps to practice with RW instead of rails and required step by step cues and min assist.  Pt not safely able to perform stairs at his time.  RN present and aware.    Recommendations for follow up therapy are one component of a multi-disciplinary discharge planning process, led by the attending physician.  Recommendations may be updated based on patient status, additional functional criteria and insurance authorization.  Follow Up Recommendations  Follow physician's recommendations for discharge plan and follow up therapies     Assistance Recommended at Discharge Frequent or constant Supervision/Assistance  Patient can return home with the following Assistance with cooking/housework;Help with stairs or ramp for entrance;A little help with bathing/dressing/bathroom;A little help with walking and/or transfers   Equipment Recommendations  BSC/3in1    Recommendations for Other Services       Precautions / Restrictions Precautions Precautions: Fall;Knee Restrictions RLE Weight Bearing: Weight bearing as tolerated     Mobility  Bed Mobility               General bed mobility comments: pt in recliner    Transfers Overall transfer level: Needs assistance Equipment used:  Rolling walker (2 wheels) Transfers: Sit to/from Stand Sit to Stand: Min assist           General transfer comment: verbal cues for UE and LE positioning for pain control, assist to rise and control descent (fatigue)    Ambulation/Gait Ambulation/Gait assistance: Min guard Gait Distance (Feet): 40 Feet Assistive device: Rolling walker (2 wheels) Gait Pattern/deviations: Step-to pattern, Antalgic, Decreased stance time - right Gait velocity: decreased     General Gait Details: Cues for RW positioning and posture   Stairs Stairs: Yes Stairs assistance: Max assist, +2 physical assistance Stair Management: Step to pattern, Forwards, One rail Right, One rail Left Number of Stairs: 2 General stair comments: suggested pt try backwards with RW however pt wanting to perform forwards and use rails, pt reporting rails feel "slippery" and had difficulty weight shifting (tough to take weight on her arms to self assist) and pt became anxious and required significant assist (+2) to calmly descend step and sit in recliner; pt wanting to d/c home so brought pt to another set of stairs which she stated the rails felt better and performed 3 steps with RW backwards with step by step cueing for sequence and safety but still required min assist   Wheelchair Mobility    Modified Rankin (Stroke Patients Only)       Balance  Cognition Arousal/Alertness: Awake/alert Behavior During Therapy: WFL for tasks assessed/performed, Anxious Overall Cognitive Status: Within Functional Limits for tasks assessed                                          Exercises      General Comments        Pertinent Vitals/Pain Pain Assessment Pain Assessment: 0-10 Pain Score: 6  Pain Location: R knee Pain Descriptors / Indicators: Aching, Sore Pain Intervention(s): Repositioned, Monitored during session, Patient requesting pain  meds-RN notified, Ice applied    Home Living                          Prior Function            PT Goals (current goals can now be found in the care plan section) Progress towards PT goals: Progressing toward goals    Frequency    7X/week      PT Plan Current plan remains appropriate    Co-evaluation              AM-PAC PT "6 Clicks" Mobility   Outcome Measure  Help needed turning from your back to your side while in a flat bed without using bedrails?: A Little Help needed moving from lying on your back to sitting on the side of a flat bed without using bedrails?: A Little Help needed moving to and from a bed to a chair (including a wheelchair)?: A Little Help needed standing up from a chair using your arms (e.g., wheelchair or bedside chair)?: A Little Help needed to walk in hospital room?: A Little Help needed climbing 3-5 steps with a railing? : Total 6 Click Score: 16    End of Session Equipment Utilized During Treatment: Gait belt Activity Tolerance: Patient limited by pain;Patient limited by fatigue Patient left: with call bell/phone within reach;in chair;with nursing/sitter in room Nurse Communication: Mobility status PT Visit Diagnosis: Other abnormalities of gait and mobility (R26.89);Muscle weakness (generalized) (M62.81)     Time: [id number] PT Time Calculation (min) (ACUTE ONLY): 41 min  Charges:  $Gait Training: 23-37 mins $Therapeutic Activity: 8-22 mins                     Jannette Spanner PT, DPT Physical Therapist Acute Rehabilitation Services Preferred contact method: Secure Chat Weekend Pager Only: [phone] Office: [place] 06/27/2022, 1:47 PM

## 2022-06-27 NOTE — Progress Notes (Signed)
   Subjective: 2 Days Post-Op Procedure(s) (LRB): TOTAL KNEE REVISION (Right) Patient seen in rounds by Dr. Wynelle Link. Patient is well, and has had no acute complaints or problems. Denies SOB or chest pain. Denies calf pain. Patient reports pain as moderate. Worked with physical therapy yesterday and ambulated 80'. She also reports she was in the CPM machine for multiple hours.  Objective: Vital signs in last 24 hours: Temp:  [97.8 F (36.6 C)-98.7 F (37.1 C)] 98.2 F (36.8 C) (03/22 [id number]) Pulse Rate:  [52-82] 82 (03/22 0613) Resp:  [16-20] 18 (03/22 0613) BP: (102-136)/(53-64) 108/55 (03/22 0613) SpO2:  [94 %-96 %] 96 % (03/22 0613)  Intake/Output from previous day:  Intake/Output Summary (Last 24 hours) at 06/27/2022 0720 Last data filed at 06/27/2022 [id number] Gross per 24 hour  Intake 888.14 ml  Output 1400 ml  Net -511.86 ml    Intake/Output this shift: No intake/output data recorded.  Labs: Recent Labs    06/26/22 0336 06/27/22 0402  HGB 11.3* 10.6*   Recent Labs    06/26/22 0336 06/27/22 0402  WBC 12.4* 11.5*  RBC 4.01 3.74*  HCT 35.8* 33.7*  PLT 266 272   Recent Labs    06/26/22 0336  NA 136  K 4.1  CL 105  CO2 25  BUN 14  CREATININE 0.77  GLUCOSE 164*  CALCIUM 8.6*   No results for input(s): "LABPT", "INR" in the last 72 hours.  Exam: General - Patient is Alert and Oriented Extremity - Neurologically intact Neurovascular intact Sensation intact distally Dorsiflexion/Plantar flexion intact Dressing/Incision - clean, dry, no drainage Motor Function - intact, moving foot and toes well on exam.  Past Medical History:  Diagnosis Date   Complication of anesthesia    "irregular heartbeat" during neck surgery   Depression    Fibroid    Rheumatoid arthritis (HCC)    Urinary incontinence     Assessment/Plan: 2 Days Post-Op Procedure(s) (LRB): TOTAL KNEE REVISION (Right) Principal Problem:   Failed total knee arthroplasty (HCC)  Estimated  body mass index is 29.63 kg/m as calculated from the following:   Height as of this encounter: 5\' 2"  (1.575 m).   Weight as of this encounter: 73.5 kg.  DVT Prophylaxis - Xarelto Weight-bearing as tolerated.  Continue physical therapy. Expected discharge home today pending progress and if meeting patient goals. Will do HHPT once discharged. Order placed this morning. Follow-up in clinic in 2 weeks.  The PDMP database was reviewed today prior to any opioid medications being prescribed to this patient.  R. Jaynie Bream, PA-C Orthopedic Surgery [phone] 06/27/2022, 7:20 AM

## 2022-06-28 NOTE — Progress Notes (Signed)
Physical Therapy Treatment Patient Details Name: [name] [name] MRN: [id number] DOB: 1960-09-01 Today's Date: 06/28/2022   History of Present Illness Pt is 62 yo female s/p R TKA revision on 06/25/22.  Pt with hx including but not limited to RA, R TKA 1996, L TKA 1989 with revision 11/2021, C spine fusion, and prior foot surgeries    PT Comments    Pt requiring increased assist to prevent falls with short distance ambulation and on stairs today.  Son present and also assisted pt.  Pt did not listen to cues provided to self correct and instead appeared panicked and anxious requiring increased assist to safely have pt return to recliner.  RN notified.    Recommendations for follow up therapy are one component of a multi-disciplinary discharge planning process, led by the attending physician.  Recommendations may be updated based on patient status, additional functional criteria and insurance authorization.  Follow Up Recommendations  Follow physician's recommendations for discharge plan and follow up therapies     Assistance Recommended at Discharge Frequent or constant Supervision/Assistance  Patient can return home with the following Assistance with cooking/housework;Help with stairs or ramp for entrance;A little help with bathing/dressing/bathroom;A little help with walking and/or transfers   Equipment Recommendations  BSC/3in1    Recommendations for Other Services       Precautions / Restrictions Precautions Precautions: Fall;Knee Restrictions RLE Weight Bearing: Weight bearing as tolerated     Mobility  Bed Mobility               General bed mobility comments: pt in recliner    Transfers Overall transfer level: Needs assistance Equipment used: Rolling walker (2 wheels) Transfers: Sit to/from Stand Sit to Stand: Min guard           General transfer comment: verbal cues for UE and LE positioning for pain control, min/guard to rise, total assist for  safely getting into recliner    Ambulation/Gait Ambulation/Gait assistance: Total assist, +2 physical assistance Gait Distance (Feet): 8 Feet Assistive device: Rolling walker (2 wheels) Gait Pattern/deviations: Step-to pattern, Antalgic, Decreased stance time - right       General Gait Details: pt only ambulating short distance to/from stairs from recliner; upon return to recliner pt screamed out in pain and for help and required total assist to safely return to recliner from therapist and her son; pt not listening to calm but firm cues for self assisting and instead went into a flexion pattern, pt not pushing down on RW or attempting to stand tall but crumpling requiring total assist to prevent fall   Stairs Stairs: Yes Stairs assistance: Total assist, +2 physical assistance Stair Management: Step to pattern, Backwards, With walker Number of Stairs: 2 General stair comments: demonstrated technique for pt and son; pt then provided step by step cues and min assist until last descending step; pt became anxious and having pain and performed into flexion/crumpled despite max cues provided to self prevent fall, so son and therapist able to assist pt to down last step with increased assist (pt appeared panicked and screamed) and pt able to stand upright   Wheelchair Mobility    Modified Rankin (Stroke Patients Only)       Balance                                            Cognition Arousal/Alertness: Awake/alert Behavior  During Therapy: WFL for tasks assessed/performed, Anxious Overall Cognitive Status: Within Functional Limits for tasks assessed                                          Exercises      General Comments        Pertinent Vitals/Pain Pain Assessment Pain Assessment: 0-10 Pain Score: 8  Pain Location: R knee with stairs Pain Descriptors / Indicators: Aching, Sore, Guarding, Other (Comment) (screaming) Pain Intervention(s):  Monitored during session, Repositioned, Premedicated before session    Home Living                          Prior Function            PT Goals (current goals can now be found in the care plan section) Progress towards PT goals: Not progressing toward goals - comment (pain and ?anxiety limiting mobility)    Frequency    7X/week      PT Plan Current plan remains appropriate    Co-evaluation              AM-PAC PT "6 Clicks" Mobility   Outcome Measure  Help needed turning from your back to your side while in a flat bed without using bedrails?: A Little Help needed moving from lying on your back to sitting on the side of a flat bed without using bedrails?: A Little Help needed moving to and from a bed to a chair (including a wheelchair)?: A Little Help needed standing up from a chair using your arms (e.g., wheelchair or bedside chair)?: A Little Help needed to walk in hospital room?: A Lot Help needed climbing 3-5 steps with a railing? : Total 6 Click Score: 15    End of Session Equipment Utilized During Treatment: Gait belt Activity Tolerance: Patient limited by pain Patient left: with call bell/phone within reach;in chair;with family/visitor present Nurse Communication: Mobility status PT Visit Diagnosis: Other abnormalities of gait and mobility (R26.89);Muscle weakness (generalized) (M62.81)     Time: [id number] PT Time Calculation (min) (ACUTE ONLY): 27 min  Charges:  $Gait Training: 23-37 mins                    Jannette Spanner PT, DPT Physical Therapist Acute Rehabilitation Services Preferred contact method: Secure Chat Weekend Pager Only: [phone] Office: [place] 06/28/2022, 12:17 PM

## 2022-06-28 NOTE — Plan of Care (Signed)

## 2022-06-28 NOTE — Progress Notes (Signed)
Patient not ready to discharge from a PT standpoint. Patient has needed cues/prompting on safety during PT sessions today. Notified Elizabeth Sauer, PA to inform that patient is not ready to discharge from a PT standpoint. Received verbal order to cancel today's discharge order.

## 2022-06-28 NOTE — Progress Notes (Signed)
Subjective: 3 Days Post-Op Procedure(s) (LRB): TOTAL KNEE REVISION (Right) Patient seen in rounds this morning for Dr. Wynelle Link Patient reports pain as 4 on 0-10 scale.   Patient doing okay, no events overnight Had some difficulty with physical therapy yesterday  Objective: Vital signs in last 24 hours: Temp:  [98.3 F (36.8 C)-99.6 F (37.6 C)] 99.6 F (37.6 C) (03/23 0927) Pulse Rate:  [76-97] 97 (03/23 0927) Resp:  [17-20] 17 (03/23 0927) BP: (107-137)/(52-72) 124/69 (03/23 0927) SpO2:  [92 %-100 %] 100 % (03/23 0927)  Intake/Output from previous day: 03/22 0701 - 03/23 0700 In: 1010 [P.O.:1010] Out: 200 [Urine:200] Intake/Output this shift: No intake/output data recorded.  Recent Labs    06/26/22 0336 06/27/22 0402  HGB 11.3* 10.6*   Recent Labs    06/26/22 0336 06/27/22 0402  WBC 12.4* 11.5*  RBC 4.01 3.74*  HCT 35.8* 33.7*  PLT 266 272   Recent Labs    06/26/22 0336  NA 136  K 4.1  CL 105  CO2 25  BUN 14  CREATININE 0.77  GLUCOSE 164*  CALCIUM 8.6*   No results for input(s): "LABPT", "INR" in the last 72 hours.  Neurologically intact Neurovascular intact Sensation intact distally Intact pulses distally Dorsiflexion/Plantar flexion intact Incision: dressing C/D/I Compartment soft   Assessment/Plan: 3 Days Post-Op Procedure(s) (LRB): TOTAL KNEE REVISION (Right) Up with therapy if patient does well with PT today then okay to be discharged home DVT prophylaxis is Xarelto Will do home health PT once discharged Follow-up in the clinic in 2 weeks Hopeful for discharge today   Derrick Ravel [id number] 06/28/2022, 9:38 AM

## 2022-06-28 NOTE — Progress Notes (Signed)
Physical Therapy Treatment Patient Details Name: [name] [name] MRN: [id number] DOB: 08-28-1960 Today's Date: 06/28/2022   History of Present Illness Pt is 62 yo female s/p R TKA revision on 06/25/22.  Pt with hx including but not limited to RA, R TKA 1996, L TKA 1989 with revision 11/2021, C spine fusion, and prior foot surgeries    PT Comments    Pt's mobility continues to require increased amount of time to perform. Pt assisted to bathroom and then ambulated short distance in hallway.  Pt declined performing stairs again this afternoon (anxious, pain, doesn't feel ready to try again) however aware further stair training is needed for safe d/c home.     Recommendations for follow up therapy are one component of a multi-disciplinary discharge planning process, led by the attending physician.  Recommendations may be updated based on patient status, additional functional criteria and insurance authorization.  Follow Up Recommendations  Follow physician's recommendations for discharge plan and follow up therapies     Assistance Recommended at Discharge Frequent or constant Supervision/Assistance  Patient can return home with the following Assistance with cooking/housework;Help with stairs or ramp for entrance;A little help with bathing/dressing/bathroom;A little help with walking and/or transfers   Equipment Recommendations  BSC/3in1    Recommendations for Other Services       Precautions / Restrictions Precautions Precautions: Fall;Knee Restrictions RLE Weight Bearing: Weight bearing as tolerated     Mobility  Bed Mobility Overal bed mobility: Needs Assistance Bed Mobility: Supine to Sit, Sit to Supine     Supine to sit: Min guard Sit to supine: Min guard   General bed mobility comments: increased time and effort    Transfers Overall transfer level: Needs assistance Equipment used: Rolling walker (2 wheels) Transfers: Sit to/from Stand Sit to Stand: Min  assist           General transfer comment: verbal cues for UE and LE positioning for pain control, assist to rise and steady    Ambulation/Gait Ambulation/Gait assistance: Min assist, Min guard Gait Distance (Feet): 40 Feet Assistive device: Rolling walker (2 wheels) Gait Pattern/deviations: Step-to pattern, Antalgic, Decreased stance time - right Gait velocity: decreased     General Gait Details: pt ambulated to bathroom and then 40 feet in hallway, increased time and effort due to UE fatigue, veering to right more this afternoon however pt reports no changes in strength, assist for weakness and stability upon returning to room likely due to fatigue   Stairs Stairs: Yes Stairs assistance: Total assist, +2 physical assistance Stair Management: Step to pattern, Backwards, With walker Number of Stairs: 2 General stair comments: demonstrated technique for pt and son; pt then provided step by step cues and min assist until last descending step; pt became anxious and having pain and performed into flexion/crumpled despite max cues provided to self prevent fall, so son and therapist able to assist pt to down last step with increased assist (pt appeared panicked and screamed) and pt able to stand upright   Wheelchair Mobility    Modified Rankin (Stroke Patients Only)       Balance                                            Cognition Arousal/Alertness: Awake/alert Behavior During Therapy: WFL for tasks assessed/performed Overall Cognitive Status: Within Functional Limits for tasks assessed  Exercises      General Comments        Pertinent Vitals/Pain Pain Assessment Pain Assessment: 0-10 Pain Score: 7  Pain Location: R knee Pain Descriptors / Indicators: Sore, Aching Pain Intervention(s): Monitored during session, Repositioned, RN gave pain meds during session    Home Living                           Prior Function            PT Goals (current goals can now be found in the care plan section) Progress towards PT goals: Progressing toward goals    Frequency    7X/week      PT Plan Current plan remains appropriate    Co-evaluation              AM-PAC PT "6 Clicks" Mobility   Outcome Measure  Help needed turning from your back to your side while in a flat bed without using bedrails?: A Little Help needed moving from lying on your back to sitting on the side of a flat bed without using bedrails?: A Little Help needed moving to and from a bed to a chair (including a wheelchair)?: A Little Help needed standing up from a chair using your arms (e.g., wheelchair or bedside chair)?: A Lot Help needed to walk in hospital room?: A Lot Help needed climbing 3-5 steps with a railing? : A Lot 6 Click Score: 15    End of Session Equipment Utilized During Treatment: Gait belt Activity Tolerance: Patient limited by fatigue;Patient limited by pain Patient left: in bed;with call bell/phone within reach Nurse Communication: Mobility status PT Visit Diagnosis: Other abnormalities of gait and mobility (R26.89);Muscle weakness (generalized) (M62.81)     Time: [id number] PT Time Calculation (min) (ACUTE ONLY): 28 min  Charges:  $Gait Training: 23-37 mins                    Jannette Spanner PT, DPT Physical Therapist Acute Rehabilitation Services Preferred contact method: Secure Chat Weekend Pager Only: [phone] Office: [place] 06/28/2022, 3:55 PM

## 2022-06-29 NOTE — Progress Notes (Signed)
    Subjective: Patient seen in rounds for Dr. Wynelle Link.  Patient reports pain as mild to moderate.  Denies N/V/CP/SOB/Abd pain.  She reports that she is doing well today.  She states she has been doing well with PT but has been anxious about performing stairs. We discussed that she has to complete stair training prior to discharge for safety. All concerns solicited and answered.   Objective:   VITALS:   Vitals:   06/28/22 0927 06/28/22 1422 06/28/22 2116 06/29/22 0558  BP: 124/69 (!) 111/56 (!) 96/55 (!) 111/52  Pulse: 97 89 88 81  Resp: 17 17 20 16   Temp: 99.6 F (37.6 C) 99.7 F (37.6 C) 99.3 F (37.4 C) 99.2 F (37.3 C)  TempSrc: Oral  Oral Oral  SpO2: 100% 97% 92% 97%  Weight:      Height:        Patient sitting in recliner this morning. NAD.  Neurologically intact ABD soft Neurovascular intact Sensation intact distally Intact pulses distally Dorsiflexion/Plantar flexion intact Incision: dressing C/D/I No cellulitis present Compartment soft   Lab Results  Component Value Date   WBC 11.5 (H) 06/27/2022   HGB 10.6 (L) 06/27/2022   HCT 33.7 (L) 06/27/2022   MCV 90.1 06/27/2022   PLT 272 06/27/2022   BMET    Component Value Date/Time   NA 136 06/26/2022 0336   K 4.1 06/26/2022 0336   CL 105 06/26/2022 0336   CO2 25 06/26/2022 0336   GLUCOSE 164 (H) 06/26/2022 0336   BUN 14 06/26/2022 0336   CREATININE 0.77 06/26/2022 0336   CREATININE 0.80 10/30/2020 0903   CALCIUM 8.6 (L) 06/26/2022 0336   EGFR 86 10/30/2020 0903   GFRNONAA >60 06/26/2022 0336   GFRNONAA 81 07/25/2020 1552     Assessment/Plan: 4 Days Post-Op   Principal Problem:   Failed total knee arthroplasty ([place])   WBAT with walker DVT ppx: Xarelto, SCDs, TEDS PO pain control PT/OT: Patient ambulated 40 feet with PT yesterday. She was anxious about attempting stairs. Continue PT today with stair training.  Dispo:  - D/c home with HHPT once cleared with PT.    Charlott Rakes,  PA-C 06/29/2022, 11:48 AM   [facility]  [place] [address], [address], [place], [place] [address] Phone: [phone] [email or web address] Facebook  [employer]

## 2022-06-29 NOTE — Progress Notes (Signed)
The patient is alert and oriented and has been seen by her physician. The orders for discharge were written. IV has been removed. Went over discharge instructions with patient and family. She is being discharged via wheelchair with all of her belongings.  

## 2022-06-29 NOTE — Plan of Care (Signed)
  Problem: Education: Goal: Knowledge of the prescribed therapeutic regimen will improve Outcome: Adequate for Discharge   Problem: Activity: Goal: Ability to avoid complications of mobility impairment will improve Outcome: Adequate for Discharge Goal: Range of joint motion will improve Outcome: Adequate for Discharge   Problem: Clinical Measurements: Goal: Postoperative complications will be avoided or minimized Outcome: Adequate for Discharge   Problem: Pain Management: Goal: Pain level will decrease with appropriate interventions Outcome: Adequate for Discharge   Problem: Skin Integrity: Goal: Will show signs of wound healing Outcome: Adequate for Discharge   Problem: Health Behavior/Discharge Planning: Goal: Ability to manage health-related needs will improve Outcome: Adequate for Discharge   Problem: Clinical Measurements: Goal: Ability to maintain clinical measurements within normal limits will improve Outcome: Adequate for Discharge Goal: Will remain free from infection Outcome: Adequate for Discharge Goal: Diagnostic test results will improve Outcome: Adequate for Discharge Goal: Respiratory complications will improve Outcome: Adequate for Discharge Goal: Cardiovascular complication will be avoided Outcome: Adequate for Discharge   Problem: Activity: Goal: Risk for activity intolerance will decrease Outcome: Adequate for Discharge   Problem: Coping: Goal: Level of anxiety will decrease Outcome: Adequate for Discharge   Problem: Elimination: Goal: Will not experience complications related to bowel motility Outcome: Adequate for Discharge Goal: Will not experience complications related to urinary retention Outcome: Adequate for Discharge   Problem: Pain Managment: Goal: General experience of comfort will improve Outcome: Adequate for Discharge   Problem: Safety: Goal: Ability to remain free from injury will improve Outcome: Adequate for Discharge    Problem: Skin Integrity: Goal: Risk for impaired skin integrity will decrease Outcome: Adequate for Discharge   

## 2022-06-29 NOTE — Progress Notes (Signed)
Physical Therapy Treatment Patient Details Name: [name] [name] MRN: [id number] DOB: 09-May-1960 Today's Date: 06/29/2022   History of Present Illness Pt is 62 yo female s/p R TKA revision on 06/25/22.  Pt with hx including but not limited to RA, R TKA 1996, L TKA 1989 with revision 11/2021, C spine fusion, and prior foot surgeries    PT Comments    Pt educated on different stair technique today since she prefers forward ascending and wanted to try a crutch (only has one rail at home).  Pt provided with step by step cues and min/guard for safety however able to physically perform today without assist.  Will return for afternoon session to practice again and anticipate d/c home later today.    Recommendations for follow up therapy are one component of a multi-disciplinary discharge planning process, led by the attending physician.  Recommendations may be updated based on patient status, additional functional criteria and insurance authorization.  Follow Up Recommendations  Follow physician's recommendations for discharge plan and follow up therapies     Assistance Recommended at Discharge Frequent or constant Supervision/Assistance  Patient can return home with the following Assistance with cooking/housework;Help with stairs or ramp for entrance;A little help with bathing/dressing/bathroom;A little help with walking and/or transfers   Equipment Recommendations  BSC/3in1    Recommendations for Other Services       Precautions / Restrictions Precautions Precautions: Fall;Knee Restrictions RLE Weight Bearing: Weight bearing as tolerated     Mobility  Bed Mobility Overal bed mobility: Needs Assistance Bed Mobility: Supine to Sit     Supine to sit: Min guard     General bed mobility comments: increased time and effort    Transfers Overall transfer level: Needs assistance Equipment used: Rolling walker (2 wheels) Transfers: Sit to/from Stand Sit to Stand: Min guard            General transfer comment: verbal cues for UE and LE positioning, increased time and effort    Ambulation/Gait Ambulation/Gait assistance: Min guard Gait Distance (Feet): 20 Feet Assistive device: Rolling walker (2 wheels) Gait Pattern/deviations: Step-to pattern, Antalgic, Decreased stance time - right Gait velocity: decreased     General Gait Details: pt ambulated a few feet to/from stairs and then ambulated to bathroom in room, increased time required   Stairs Stairs: Yes Stairs assistance: Min guard Stair Management: Step to pattern, Forwards, One rail Left, With crutches Number of Stairs: 2 General stair comments: verbal cues for safety and technique, step by step cues provided as well as min/guard and recliner close for safety   Wheelchair Mobility    Modified Rankin (Stroke Patients Only)       Balance                                            Cognition Arousal/Alertness: Awake/alert Behavior During Therapy: WFL for tasks assessed/performed Overall Cognitive Status: Within Functional Limits for tasks assessed                                          Exercises      General Comments        Pertinent Vitals/Pain Pain Assessment Pain Assessment: 0-10 Pain Score: 5  Pain Location: R knee Pain Descriptors / Indicators: Sore, Aching Pain  Intervention(s): Monitored during session, Premedicated before session, Repositioned    Home Living                          Prior Function            PT Goals (current goals can now be found in the care plan section) Progress towards PT goals: Progressing toward goals    Frequency    7X/week      PT Plan Current plan remains appropriate    Co-evaluation              AM-PAC PT "6 Clicks" Mobility   Outcome Measure  Help needed turning from your back to your side while in a flat bed without using bedrails?: A Little Help needed moving from  lying on your back to sitting on the side of a flat bed without using bedrails?: A Little Help needed moving to and from a bed to a chair (including a wheelchair)?: A Little Help needed standing up from a chair using your arms (e.g., wheelchair or bedside chair)?: A Little Help needed to walk in hospital room?: A Little Help needed climbing 3-5 steps with a railing? : A Little 6 Click Score: 18    End of Session Equipment Utilized During Treatment: Gait belt Activity Tolerance: Patient limited by fatigue Patient left: in chair;with call bell/phone within reach;with chair alarm set Nurse Communication: Mobility status PT Visit Diagnosis: Other abnormalities of gait and mobility (R26.89);Muscle weakness (generalized) (M62.81)     Time: [id number] PT Time Calculation (min) (ACUTE ONLY): 23 min  Charges:  $Gait Training: 23-37 mins                    Jannette Spanner PT, DPT Physical Therapist Acute Rehabilitation Services Preferred contact method: Secure Chat Weekend Pager Only: [phone] Office: [place] 06/29/2022, 4:46 PM

## 2022-06-29 NOTE — Progress Notes (Signed)
Physical Therapy Treatment Patient Details Name: [name] [name] MRN: [id number] DOB: 1960/11/01 Today's Date: 06/29/2022   History of Present Illness Pt is 62 yo female s/p R TKA revision on 06/25/22.  Pt with hx including but not limited to RA, R TKA 1996, L TKA 1989 with revision 11/2021, C spine fusion, and prior foot surgeries    PT Comments    Pt practiced safe stair technique again with crutch and rail.  Pt able to perform with increased time and min/guard (same as with all other mobility today).  Pt does not have crutches at home so provided crutches as well as handout on stair technique.  Pt is ready for d/c home today.     Recommendations for follow up therapy are one component of a multi-disciplinary discharge planning process, led by the attending physician.  Recommendations may be updated based on patient status, additional functional criteria and insurance authorization.  Follow Up Recommendations  Follow physician's recommendations for discharge plan and follow up therapies     Assistance Recommended at Discharge Frequent or constant Supervision/Assistance  Patient can return home with the following Assistance with cooking/housework;Help with stairs or ramp for entrance;A little help with bathing/dressing/bathroom;A little help with walking and/or transfers   Equipment Recommendations  BSC/3in1;Crutches    Recommendations for Other Services       Precautions / Restrictions Precautions Precautions: Fall;Knee Restrictions RLE Weight Bearing: Weight bearing as tolerated     Mobility  Bed Mobility Overal bed mobility: Needs Assistance Bed Mobility: Supine to Sit     Supine to sit: Min guard     General bed mobility comments: increased time and effort    Transfers Overall transfer level: Needs assistance Equipment used: Rolling walker (2 wheels) Transfers: Sit to/from Stand Sit to Stand: Min guard           General transfer comment: verbal cues  for UE and LE positioning, increased time and effort    Ambulation/Gait Ambulation/Gait assistance: Min guard Gait Distance (Feet): 20 Feet Assistive device: Rolling walker (2 wheels) Gait Pattern/deviations: Step-to pattern, Antalgic, Decreased stance time - right Gait velocity: decreased     General Gait Details: pt ambulated a few feet to/from stairs and then ambulated to bathroom in room, increased time required   Stairs Stairs: Yes Stairs assistance: Min guard Stair Management: Step to pattern, Forwards, One rail Left, With crutches Number of Stairs: 2 General stair comments: verbal cues for safety and technique, close guard for safety   Wheelchair Mobility    Modified Rankin (Stroke Patients Only)       Balance                                            Cognition Arousal/Alertness: Awake/alert Behavior During Therapy: WFL for tasks assessed/performed Overall Cognitive Status: Within Functional Limits for tasks assessed                                          Exercises      General Comments        Pertinent Vitals/Pain Pain Assessment Pain Assessment: 0-10 Pain Score: 5  Pain Location: R knee Pain Descriptors / Indicators: Sore, Aching Pain Intervention(s): Repositioned, Monitored during session, Premedicated before session    Home Living  Prior Function            PT Goals (current goals can now be found in the care plan section) Progress towards PT goals: Progressing toward goals    Frequency    7X/week      PT Plan Current plan remains appropriate    Co-evaluation              AM-PAC PT "6 Clicks" Mobility   Outcome Measure  Help needed turning from your back to your side while in a flat bed without using bedrails?: A Little Help needed moving from lying on your back to sitting on the side of a flat bed without using bedrails?: A Little Help needed  moving to and from a bed to a chair (including a wheelchair)?: A Little Help needed standing up from a chair using your arms (e.g., wheelchair or bedside chair)?: A Little Help needed to walk in hospital room?: A Little Help needed climbing 3-5 steps with a railing? : A Little 6 Click Score: 18    End of Session Equipment Utilized During Treatment: Gait belt Activity Tolerance: Patient limited by fatigue Patient left: in chair;with call bell/phone within reach;with chair alarm set Nurse Communication: Mobility status PT Visit Diagnosis: Other abnormalities of gait and mobility (R26.89);Muscle weakness (generalized) (M62.81)     Time: [id number] PT Time Calculation (min) (ACUTE ONLY): 42 min  Charges:  $Gait Training: 38-52 mins                    Jannette Spanner PT, DPT Physical Therapist Acute Rehabilitation Services Preferred contact method: Secure Chat Weekend Pager Only: [phone] Office: [place] 06/29/2022, 4:52 PM

## 2022-07-01 NOTE — Discharge Summary (Signed)
Physician Discharge Summary  Patient ID: [name] [name] MRN: [id number] DOB/AGE: 1960/07/19 63 y.o.  Admit date: 06/25/2022 Discharge date: 06/29/2022  Admission Diagnoses:  Failed total knee arthroplasty [facility])  Discharge Diagnoses:  Principal Problem:   Failed total knee arthroplasty [facility])   Past Medical History:  Diagnosis Date   Complication of anesthesia    "irregular heartbeat" during neck surgery   Depression    Fibroid    Rheumatoid arthritis ([place])    Urinary incontinence     Surgeries: Procedure(s): TOTAL KNEE REVISION on 06/25/2022   Consultants (if any):   Discharged Condition: Improved  Hospital Course: [name] [name] is an 62 y.o. female who was admitted 06/25/2022 with a diagnosis of Failed total knee arthroplasty ([place]) and went to the operating room on 06/25/2022 and underwent the above named procedures.    She was given perioperative antibiotics:  Anti-infectives (From admission, onward)    Start     Dose/Rate Route Frequency Ordered Stop   06/25/22 1700  ceFAZolin (ANCEF) IVPB 2g/100 mL premix        2 g 200 mL/hr over 30 Minutes Intravenous Every 6 hours 06/25/22 1514 06/26/22 0025   06/25/22 0945  ceFAZolin (ANCEF) IVPB 2g/100 mL premix        2 g 200 mL/hr over 30 Minutes Intravenous On call to O.R. 06/25/22 0940 06/25/22 1136       She was given sequential compression devices, early ambulation, and xarelto for DVT prophylaxis.  POD#1 She ambulated 140 and 80 feet with PT. Process started for set up for HHPT.  POD#2 Increased pain. She ambulated 80 and 40 feet with PT. She used CMP machine.  POD#3 She ambulated 8 feet and 40 with PT, and had reservation with stair training. She did not clear PT.  POD#4 She was able to clear PT and discharged home with HHPT.   She benefited maximally from the hospital stay and there were no complications.    Recent vital signs:  Vitals:   06/29/22 0558 06/29/22 1503  BP: (!) 111/52 111/68   Pulse: 81 86  Resp: 16 (!) 22  Temp: 99.2 F (37.3 C) 98.8 F (37.1 C)  SpO2: 97% 96%    Recent laboratory studies:  Lab Results  Component Value Date   HGB 10.6 (L) 06/27/2022   HGB 11.3 (L) 06/26/2022   HGB 13.1 06/12/2022   Lab Results  Component Value Date   WBC 11.5 (H) 06/27/2022   PLT 272 06/27/2022   No results found for: "INR" Lab Results  Component Value Date   NA 136 06/26/2022   K 4.1 06/26/2022   CL 105 06/26/2022   CO2 25 06/26/2022   BUN 14 06/26/2022   CREATININE 0.77 06/26/2022   GLUCOSE 164 (H) 06/26/2022     Allergies as of 06/29/2022       Reactions   Sulfa Antibiotics Shortness Of Breath   Morphine And Related Rash   Penicillins Rash   Tolerated Cephalosporin Date: 12/05/21.        Medication List     STOP taking these medications    NONFORMULARY OR COMPOUNDED ITEM       TAKE these medications    acetaminophen 500 MG tablet Commonly known as: TYLENOL Take 1,000 mg by mouth every 6 (six) hours as needed for pain.   citalopram 20 MG tablet Commonly known as: CeleXA Take 1 tablet (20 mg total) by mouth daily. What changed:  when to take this reasons to take  this Notes to patient: Resume home regimen   folic acid 1 MG tablet Commonly known as: FOLVITE Take 2 mg by mouth daily. Notes to patient: Resume home regimen   gabapentin 300 MG capsule Commonly known as: NEURONTIN Take 1 capsule 3 times a day for 2 weeks following surgery.Then take 1 capsule 2 times a day for 2 weeks. Then take 1 capsule daily for 2 weeks. Then discontinue.   methocarbamol 500 MG tablet Commonly known as: ROBAXIN Take 1 tablet (500 mg total) by mouth every 6 (six) hours as needed for muscle spasms.   methotrexate 2.5 MG tablet Commonly known as: RHEUMATREX TAKE 7 TABLETS(17.5 MG TOTAL) BY MOUTH ONCE A WEEK. PROTECT FROM LIGHT Notes to patient: Resume home regimen   Orencia ClickJect 0000000 MG/ML Soaj Generic drug: Abatacept INJECT ONE  CLICKJECT PEN UNDER THE SKIN EVERY WEEK. REFRIGERATE. ALLOW TO WARM TO ROOM TEMPERATURE PRIOR TO ADMINISTRATION. Notes to patient: Resume home regimen   oxyCODONE 5 MG immediate release tablet Commonly known as: Oxy IR/ROXICODONE Take 1-2 tablets (5-10 mg total) by mouth every 6 (six) hours as needed for severe pain.   Xarelto 10 MG Tabs tablet Generic drug: rivaroxaban Take 1 tablet (10 mg total) by mouth daily with breakfast for 19 days. Then take an 81 mg aspirin once a day for 3 weeks. Then discontinue.               Discharge Care Instructions  (From admission, onward)           Start     Ordered   06/28/22 0000  Weight bearing as tolerated        06/28/22 0942   06/28/22 0000  Change dressing       Comments: You may remove the bulky bandage (ACE wrap and gauze) two days after surgery. You will have an adhesive waterproof bandage underneath. Leave this in place until your first follow-up appointment.   06/28/22 0942   06/27/22 0000  Weight bearing as tolerated        06/27/22 0726   06/27/22 0000  Change dressing       Comments: You may remove the bulky bandage (ACE wrap and gauze) two days after surgery. You will have an adhesive waterproof bandage underneath. Leave this in place until your first follow-up appointment.   06/27/22 0726              WEIGHT BEARING   Weight bearing as tolerated with assist device (walker, cane, etc) as directed, use it as long as suggested by your surgeon or therapist, typically at least 4-6 weeks.   EXERCISES  Results after joint replacement surgery are often greatly improved when you follow the exercise, range of motion and muscle strengthening exercises prescribed by your doctor. Safety measures are also important to protect the joint from further injury. Any time any of these exercises cause you to have increased pain or swelling, decrease what you are doing until you are comfortable again and then slowly increase them. If  you have problems or questions, call your caregiver or physical therapist for advice.   Rehabilitation is important following a joint replacement. After just a few days of immobilization, the muscles of the leg can become weakened and shrink (atrophy).  These exercises are designed to build up the tone and strength of the thigh and leg muscles and to improve motion. Often times heat used for twenty to thirty minutes before working out will loosen up your tissues and  help with improving the range of motion but do not use heat for the first two weeks following surgery (sometimes heat can increase post-operative swelling).   These exercises can be done on a training (exercise) mat, on the floor, on a table or on a bed. Use whatever works the best and is most comfortable for you.    Use music or television while you are exercising so that the exercises are a pleasant break in your day. This will make your life better with the exercises acting as a break in your routine that you can look forward to.   Perform all exercises about fifteen times, three times per day or as directed.  You should exercise both the operative leg and the other leg as well.  Exercises include:   Quad Sets - Tighten up the muscle on the front of the thigh (Quad) and hold for 5-10 seconds.   Straight Leg Raises - With your knee straight (if you were given a brace, keep it on), lift the leg to 60 degrees, hold for 3 seconds, and slowly lower the leg.  Perform this exercise against resistance later as your leg gets stronger.  Leg Slides: Lying on your back, slowly slide your foot toward your buttocks, bending your knee up off the floor (only go as far as is comfortable). Then slowly slide your foot back down until your leg is flat on the floor again.  Angel Wings: Lying on your back spread your legs to the side as far apart as you can without causing discomfort.  Hamstring Strength:  Lying on your back, push your heel against the floor  with your leg straight by tightening up the muscles of your buttocks.  Repeat, but this time bend your knee to a comfortable angle, and push your heel against the floor.  You may put a pillow under the heel to make it more comfortable if necessary.   A rehabilitation program following joint replacement surgery can speed recovery and prevent re-injury in the future due to weakened muscles. Contact your doctor or a physical therapist for more information on knee rehabilitation.    CONSTIPATION  Constipation is defined medically as fewer than three stools per week and severe constipation as less than one stool per week.  Even if you have a regular bowel pattern at home, your normal regimen is likely to be disrupted due to multiple reasons following surgery.  Combination of anesthesia, postoperative narcotics, change in appetite and fluid intake all can affect your bowels.   YOU MUST use at least one of the following options; they are listed in order of increasing strength to get the job done.  They are all available over the counter, and you may need to use some, POSSIBLY even all of these options:    Drink plenty of fluids (prune juice may be helpful) and high fiber foods Colace 100 mg by mouth twice a day  Senokot for constipation as directed and as needed Dulcolax (bisacodyl), take with full glass of water  Miralax (polyethylene glycol) once or twice a day as needed.  If you have tried all these things and are unable to have a bowel movement in the first 3-4 days after surgery call either your surgeon or your primary doctor.    If you experience loose stools or diarrhea, hold the medications until you stool forms back up.  If your symptoms do not get better within 1 week or if they get worse, check with your doctor.  If you experience "the worst abdominal pain ever" or develop nausea or vomiting, please contact the office immediately for further recommendations for treatment.   ITCHING:  If you  experience itching with your medications, try taking only a single pain pill, or even half a pain pill at a time.  You can also use Benadryl over the counter for itching or also to help with sleep.   TED HOSE STOCKINGS:  Use stockings on both legs until for at least 2 weeks or as directed by physician office. They may be removed at night for sleeping.  MEDICATIONS:  See your medication summary on the "After Visit Summary" that nursing will review with you.  You may have some home medications which will be placed on hold until you complete the course of blood thinner medication.  It is important for you to complete the blood thinner medication as prescribed.  PRECAUTIONS:  If you experience chest pain or shortness of breath - call 911 immediately for transfer to the hospital emergency department.   If you develop a fever greater that 101 F, purulent drainage from wound, increased redness or drainage from wound, foul odor from the wound/dressing, or calf pain - CONTACT YOUR SURGEON.                                                   FOLLOW-UP APPOINTMENTS:  If you do not already have a post-op appointment, please call the office for an appointment to be seen by your surgeon.  Guidelines for how soon to be seen are listed in your "After Visit Summary", but are typically between 1-4 weeks after surgery.  OTHER INSTRUCTIONS:   Knee Replacement:  Do not place pillow under knee, focus on keeping the knee straight while resting. CPM instructions: 0-90 degrees, 2 hours in the morning, 2 hours in the afternoon, and 2 hours in the evening. Place foam block, curve side up under heel at all times except when in CPM or when walking.  DO NOT modify, tear, cut, or change the foam block in any way.   MAKE SURE YOU:  Understand these instructions.  Get help right away if you are not doing well or get worse.    Thank you for letting us be a part of your medical care team.  It is a privilege we respect greatly.  We  hope these instructions will help you stay on track for a fast and full recovery!   Diagnostic Studies: No results found.  Disposition: Discharge disposition: 01-Home or Self Care       Discharge Instructions     Call MD / Call 911   Complete by: As directed    If you experience chest pain or shortness of breath, CALL 911 and be transported to the hospital emergency room.  If you develope a fever above 101 F, pus (white drainage) or increased drainage or redness at the wound, or calf pain, call your surgeon's office.   Call MD / Call 911   Complete by: As directed    If you experience chest pain or shortness of breath, CALL 911 and be transported to the hospital emergency room.  If you develope a fever above 101 F, pus (white drainage) or increased drainage or redness at the wound, or calf pain, call your surgeon's office.   Change dressing  Complete by: As directed    You may remove the bulky bandage (ACE wrap and gauze) two days after surgery. You will have an adhesive waterproof bandage underneath. Leave this in place until your first follow-up appointment.   Change dressing   Complete by: As directed    You may remove the bulky bandage (ACE wrap and gauze) two days after surgery. You will have an adhesive waterproof bandage underneath. Leave this in place until your first follow-up appointment.   Constipation Prevention   Complete by: As directed    Drink plenty of fluids.  Prune juice may be helpful.  You may use a stool softener, such as Colace (over the counter) 100 mg twice a day.  Use MiraLax (over the counter) for constipation as needed.   Constipation Prevention   Complete by: As directed    Drink plenty of fluids.  Prune juice may be helpful.  You may use a stool softener, such as Colace (over the counter) 100 mg twice a day.  Use MiraLax (over the counter) for constipation as needed.   Diet - low sodium heart healthy   Complete by: As directed    Diet - low sodium heart  healthy   Complete by: As directed    Discharge patient   Complete by: As directed    Discharge disposition: 01-Home or Self Care   Discharge patient date: 06/29/2022   Do not put a pillow under the knee. Place it under the heel.   Complete by: As directed    Do not put a pillow under the knee. Place it under the heel.   Complete by: As directed    Driving restrictions   Complete by: As directed    No driving for two weeks   Driving restrictions   Complete by: As directed    No driving for two weeks   Post-operative opioid taper instructions:   Complete by: As directed    POST-OPERATIVE OPIOID TAPER INSTRUCTIONS: It is important to wean off of your opioid medication as soon as possible. If you do not need pain medication after your surgery it is ok to stop day one. Opioids include: Codeine, Hydrocodone(Norco, Vicodin), Oxycodone(Percocet, oxycontin) and hydromorphone amongst others.  Long term and even short term use of opiods can cause: Increased pain response Dependence Constipation Depression Respiratory depression And more.  Withdrawal symptoms can include Flu like symptoms Nausea, vomiting And more Techniques to manage these symptoms Hydrate well Eat regular healthy meals Stay active Use relaxation techniques(deep breathing, meditating, yoga) Do Not substitute Alcohol to help with tapering If you have been on opioids for less than two weeks and do not have pain than it is ok to stop all together.  Plan to wean off of opioids This plan should start within one week post op of your joint replacement. Maintain the same interval or time between taking each dose and first decrease the dose.  Cut the total daily intake of opioids by one tablet each day Next start to increase the time between doses. The last dose that should be eliminated is the evening dose.      Post-operative opioid taper instructions:   Complete by: As directed    POST-OPERATIVE OPIOID TAPER  INSTRUCTIONS: It is important to wean off of your opioid medication as soon as possible. If you do not need pain medication after your surgery it is ok to stop day one. Opioids include: Codeine, Hydrocodone(Norco, Vicodin), Oxycodone(Percocet, oxycontin) and hydromorphone amongst others.  Long  term and even short term use of opiods can cause: Increased pain response Dependence Constipation Depression Respiratory depression And more.  Withdrawal symptoms can include Flu like symptoms Nausea, vomiting And more Techniques to manage these symptoms Hydrate well Eat regular healthy meals Stay active Use relaxation techniques(deep breathing, meditating, yoga) Do Not substitute Alcohol to help with tapering If you have been on opioids for less than two weeks and do not have pain than it is ok to stop all together.  Plan to wean off of opioids This plan should start within one week post op of your joint replacement. Maintain the same interval or time between taking each dose and first decrease the dose.  Cut the total daily intake of opioids by one tablet each day Next start to increase the time between doses. The last dose that should be eliminated is the evening dose.      TED hose   Complete by: As directed    Use stockings (TED hose) for three weeks on both leg(s).  You may remove them at night for sleeping.   TED hose   Complete by: As directed    Use stockings (TED hose) for three weeks on both leg(s).  You may remove them at night for sleeping.   Weight bearing as tolerated   Complete by: As directed    Weight bearing as tolerated   Complete by: As directed         Follow-up Information     Aluisio, Pilar Plate, MD. Schedule an appointment as soon as possible for a visit in 2 week(s).   Specialty: Orthopedic Surgery Contact information: [address] [place] [address] [id number]         Care, [facility] Follow up.   Specialty: Home Health  Services Why: Alvis Lemmings will provide PT in the home after discharge. Contact information: [place] [address] [place]. Call.   Why: For food Personnel officer information: [place], [place] [address] Phone: [phone]        [place]. Call.   Why: For food pantry assistance Contact information: [place], [place] [address] [phone]                 Signed: Charlott Rakes 07/01/2022, 8:52 PM

## 2022-07-08 ENCOUNTER — Inpatient Hospital Stay (HOSPITAL_COMMUNITY)
Admission: EM | Admit: 2022-07-08 | Discharge: 2022-07-14 | DRG: 489 | Disposition: A | Discharge status: Skilled Nursing Facility | Payer: Medicare Other | Attending: Orthopedic Surgery | Admitting: Orthopedic Surgery

## 2022-07-08 ENCOUNTER — Emergency Department (HOSPITAL_COMMUNITY): Payer: Medicare Other

## 2022-07-08 DIAGNOSIS — S76111A Strain of right quadriceps muscle, fascia and tendon, initial encounter: Secondary | ICD-10-CM | POA: Diagnosis present

## 2022-07-08 DIAGNOSIS — Z79899 Other long term (current) drug therapy: Secondary | ICD-10-CM | POA: Diagnosis not present

## 2022-07-08 DIAGNOSIS — T84022A Instability of internal right knee prosthesis, initial encounter: Secondary | ICD-10-CM | POA: Diagnosis present

## 2022-07-08 DIAGNOSIS — Z823 Family history of stroke: Secondary | ICD-10-CM | POA: Diagnosis not present

## 2022-07-08 DIAGNOSIS — Z800 Family history of malignant neoplasm of digestive organs: Secondary | ICD-10-CM

## 2022-07-08 DIAGNOSIS — T8484XA Pain due to internal orthopedic prosthetic devices, implants and grafts, initial encounter: Secondary | ICD-10-CM | POA: Diagnosis present

## 2022-07-08 DIAGNOSIS — F32A Depression, unspecified: Secondary | ICD-10-CM | POA: Diagnosis present

## 2022-07-08 DIAGNOSIS — E669 Obesity, unspecified: Secondary | ICD-10-CM | POA: Diagnosis present

## 2022-07-08 DIAGNOSIS — Z96651 Presence of right artificial knee joint: Secondary | ICD-10-CM | POA: Diagnosis present

## 2022-07-08 DIAGNOSIS — Y831 Surgical operation with implant of artificial internal device as the cause of abnormal reaction of the patient, or of later complication, without mention of misadventure at the time of the procedure: Secondary | ICD-10-CM | POA: Diagnosis present

## 2022-07-08 DIAGNOSIS — Z801 Family history of malignant neoplasm of trachea, bronchus and lung: Secondary | ICD-10-CM | POA: Diagnosis not present

## 2022-07-08 DIAGNOSIS — M069 Rheumatoid arthritis, unspecified: Secondary | ICD-10-CM | POA: Diagnosis not present

## 2022-07-08 DIAGNOSIS — Z885 Allergy status to narcotic agent status: Secondary | ICD-10-CM

## 2022-07-08 DIAGNOSIS — Z9079 Acquired absence of other genital organ(s): Secondary | ICD-10-CM | POA: Diagnosis not present

## 2022-07-08 DIAGNOSIS — Z7901 Long term (current) use of anticoagulants: Secondary | ICD-10-CM

## 2022-07-08 DIAGNOSIS — Z8249 Family history of ischemic heart disease and other diseases of the circulatory system: Secondary | ICD-10-CM | POA: Diagnosis not present

## 2022-07-08 DIAGNOSIS — Z803 Family history of malignant neoplasm of breast: Secondary | ICD-10-CM | POA: Diagnosis not present

## 2022-07-08 DIAGNOSIS — M25561 Pain in right knee: Principal | ICD-10-CM

## 2022-07-08 DIAGNOSIS — T84028A Dislocation of other internal joint prosthesis, initial encounter: Secondary | ICD-10-CM | POA: Diagnosis not present

## 2022-07-08 DIAGNOSIS — Z751 Person awaiting admission to adequate facility elsewhere: Secondary | ICD-10-CM

## 2022-07-08 DIAGNOSIS — Z981 Arthrodesis status: Secondary | ICD-10-CM

## 2022-07-08 DIAGNOSIS — Z880 Allergy status to penicillin: Secondary | ICD-10-CM

## 2022-07-08 DIAGNOSIS — Z90722 Acquired absence of ovaries, bilateral: Secondary | ICD-10-CM

## 2022-07-08 DIAGNOSIS — Y792 Prosthetic and other implants, materials and accessory orthopedic devices associated with adverse incidents: Secondary | ICD-10-CM | POA: Diagnosis present

## 2022-07-08 DIAGNOSIS — Z96659 Presence of unspecified artificial knee joint: Secondary | ICD-10-CM

## 2022-07-08 DIAGNOSIS — Z882 Allergy status to sulfonamides status: Secondary | ICD-10-CM | POA: Diagnosis not present

## 2022-07-08 DIAGNOSIS — Z8041 Family history of malignant neoplasm of ovary: Secondary | ICD-10-CM

## 2022-07-08 DIAGNOSIS — Z6831 Body mass index (BMI) 31.0-31.9, adult: Secondary | ICD-10-CM | POA: Diagnosis not present

## 2022-07-08 DIAGNOSIS — Z90710 Acquired absence of both cervix and uterus: Secondary | ICD-10-CM | POA: Diagnosis not present

## 2022-07-08 DIAGNOSIS — T84018A Broken internal joint prosthesis, other site, initial encounter: Secondary | ICD-10-CM

## 2022-07-08 LAB — BASIC METABOLIC PANEL
Anion gap: 8 (ref 5–15)
BUN: 12 mg/dL (ref 6–20)
CO2: 24 mmol/L (ref 22–32)
Calcium: 9.2 mg/dL (ref 8.9–10.3)
Chloride: 103 mmol/L (ref 98–111)
Creatinine, Ser: 0.72 mg/dL (ref 0.44–1.00)
GFR, Estimated: >60 mL/min (ref >60)
Glucose, Bld: 102 mg/dL — ABNORMAL HIGH (ref 70–99)
Potassium: 4.3 mmol/L (ref 3.5–5.1)
Sodium: 135 mmol/L (ref 135–145)

## 2022-07-08 LAB — CBC
HCT: 38.8 % (ref 36.0–46.0)
Hemoglobin: 12.1 g/dL (ref 12.0–15.0)
MCH: 27.8 pg (ref 26.0–34.0)
MCHC: 31.2 g/dL (ref 30.0–36.0)
MCV: 89 fL (ref 80.0–100.0)
Platelets: 524 10*3/uL — ABNORMAL HIGH (ref 150–400)
RBC: 4.36 MIL/uL (ref 3.87–5.11)
RDW: 15.7 % — ABNORMAL HIGH (ref 11.5–15.5)
WBC: 12.8 10*3/uL — ABNORMAL HIGH (ref 4.0–10.5)
nRBC: 0 % (ref 0.0–0.2)

## 2022-07-08 LAB — HIV ANTIBODY (ROUTINE TESTING W REFLEX): HIV Screen 4th Generation wRfx: NONREACTIVE

## 2022-07-08 MED ORDER — METHOCARBAMOL 500 MG PO TABS
500.0000 mg | ORAL_TABLET | Freq: Four times a day (QID) | ORAL | Status: DC | PRN
  Administered 2022-07-08: 500 mg via ORAL
  Filled 2022-07-08 (×2): qty 1

## 2022-07-08 MED ORDER — DOCUSATE SODIUM 100 MG PO CAPS
100.0000 mg | ORAL_CAPSULE | Freq: Two times a day (BID) | ORAL | Status: DC
  Administered 2022-07-08: 100 mg via ORAL
  Filled 2022-07-08: qty 1

## 2022-07-08 MED ORDER — HYDROMORPHONE HCL 1 MG/ML IJ SOLN
0.5000 mg | INTRAMUSCULAR | Status: DC | PRN
  Administered 2022-07-11: 1 mg via INTRAVENOUS
  Filled 2022-07-08 (×2): qty 1

## 2022-07-08 MED ORDER — ACETAMINOPHEN 500 MG PO TABS
1000.0000 mg | ORAL_TABLET | Freq: Four times a day (QID) | ORAL | Status: DC | PRN
  Administered 2022-07-10 – 2022-07-13 (×6): 1000 mg via ORAL
  Filled 2022-07-08 (×7): qty 2

## 2022-07-08 MED ORDER — SODIUM CHLORIDE 0.9 % IV SOLN: INTRAVENOUS | Status: DC

## 2022-07-08 MED ORDER — OXYCODONE HCL 5 MG PO TABS
10.0000 mg | ORAL_TABLET | ORAL | Status: DC | PRN
  Administered 2022-07-09 – 2022-07-14 (×8): 10 mg via ORAL
  Filled 2022-07-08: qty 2

## 2022-07-08 MED ORDER — OXYCODONE HCL 5 MG PO TABS
5.0000 mg | ORAL_TABLET | ORAL | Status: DC | PRN
  Administered 2022-07-08 – 2022-07-12 (×10): 10 mg via ORAL
  Administered 2022-07-13: 5 mg via ORAL
  Administered 2022-07-13 (×2): 10 mg via ORAL
  Filled 2022-07-08 (×10): qty 2
  Filled 2022-07-08: qty 1
  Filled 2022-07-08 (×11): qty 2

## 2022-07-08 MED ORDER — CITALOPRAM HYDROBROMIDE 20 MG PO TABS
20.0000 mg | ORAL_TABLET | Freq: Every day | ORAL | Status: DC
  Administered 2022-07-08 – 2022-07-14 (×7): 20 mg via ORAL
  Filled 2022-07-08 (×7): qty 1

## 2022-07-08 MED ORDER — OXYCODONE HCL 5 MG PO TABS
10.0000 mg | ORAL_TABLET | Freq: Once | ORAL | Status: AC
  Administered 2022-07-08: 10 mg via ORAL
  Filled 2022-07-08: qty 2

## 2022-07-08 NOTE — ED Provider Notes (Signed)
[place] [facility] Provider Note   CSN: [id number] Arrival date & time: 07/08/22  1242     History  Chief Complaint  Patient presents with   Knee Pain    [name] [name] is a 62 y.o. female here with right knee pain.  The patient had a right knee replacement performed with [facility] approximately 2 weeks ago.  She was started on Xarelto afterwards as DVT prophylaxis.  She says she has been doing well and able to ambulate until yesterday when she was in bed, reports that she was simply shifting her weight and she felt a "pop" in her knee, and began immediately having pain around her knee.  She has not been able to bear weight since then, even with a walker.  She has been taking oxycodone 5 mg with little relief.  HPI     Home Medications Prior to Admission medications   Medication Sig Start Date End Date Taking? Authorizing Provider  acetaminophen (TYLENOL) 500 MG tablet Take 1,000 mg by mouth every 6 (six) hours as needed for pain.    [provider]  citalopram (CELEXA) 20 MG tablet Take 1 tablet (20 mg total) by mouth daily. Patient taking differently: Take 20 mg by mouth daily as needed (depression). 06/03/16   Bo Merino, MD  folic acid (FOLVITE) 1 MG tablet Take 2 mg by mouth daily.    [provider]  gabapentin (NEURONTIN) 300 MG capsule Take 1 capsule 3 times a day for 2 weeks following surgery.Then take 1 capsule 2 times a day for 2 weeks. Then take 1 capsule daily for 2 weeks. Then discontinue. 06/27/22   Jearld Lesch, PA  methocarbamol (ROBAXIN) 500 MG tablet Take 1 tablet (500 mg total) by mouth every 6 (six) hours as needed for muscle spasms. 06/27/22   Jearld Lesch, PA  methotrexate (RHEUMATREX) 2.5 MG tablet TAKE 7 TABLETS(17.5 MG TOTAL) BY MOUTH ONCE A WEEK. PROTECT FROM LIGHT 11/08/20   Ofilia Neas, PA-C  ORENCIA CLICKJECT 0000000 MG/ML SOAJ INJECT ONE CLICKJECT PEN UNDER THE SKIN EVERY WEEK.  REFRIGERATE. ALLOW TO WARM TO ROOM TEMPERATURE PRIOR TO ADMINISTRATION. 11/13/20   Ofilia Neas, PA-C  oxyCODONE (OXY IR/ROXICODONE) 5 MG immediate release tablet Take 1-2 tablets (5-10 mg total) by mouth every 6 (six) hours as needed for severe pain. 06/27/22   Jearld Lesch, PA  rivaroxaban (XARELTO) 10 MG TABS tablet Take 1 tablet (10 mg total) by mouth daily with breakfast for 19 days. Then take an 81 mg aspirin once a day for 3 weeks. Then discontinue. 06/27/22 07/16/22  Jearld Lesch, PA      Allergies    Sulfa antibiotics, Morphine and related, and Penicillins    Review of Systems   Review of Systems  Physical Exam Updated Vital Signs BP 124/71   Pulse 67   Temp 98.5 F (36.9 C) (Oral)   Resp 18   LMP 04/07/2012   SpO2 99%  Physical Exam Constitutional:      General: She is not in acute distress. HENT:     Head: Normocephalic and atraumatic.  Eyes:     Conjunctiva/sclera: Conjunctivae normal.     Pupils: Pupils are equal, round, and reactive to light.  Cardiovascular:     Rate and Rhythm: Normal rate and regular rhythm.  Pulmonary:     Effort: Pulmonary effort is normal. No respiratory distress.  Musculoskeletal:     Comments: Moderate-sized effusion peripatellar in the right side,  incision site appears clean and intact, patient is able to fully flex her knee, but is not able to extend the lower leg.  Skin:    General: Skin is warm and dry.  Neurological:     General: No focal deficit present.     Mental Status: She is alert. Mental status is at baseline.  Psychiatric:        Mood and Affect: Mood normal.        Behavior: Behavior normal.     ED Results / Procedures / Treatments   Labs (all labs ordered are listed, but only abnormal results are displayed) Labs Reviewed  BASIC METABOLIC PANEL - Abnormal; Notable for the following components:      Result Value   Glucose, Bld 102 (*)    All other components within normal limits  CBC - Abnormal; Notable  for the following components:   WBC 12.8 (*)    RDW 15.7 (*)    Platelets 524 (*)    All other components within normal limits    EKG None  Radiology DG Knee Complete 4 Views Right  Result Date: 07/08/2022 CLINICAL DATA:  Right knee pain. History of knee surgery 2 weeks ago EXAM: RIGHT KNEE - COMPLETE 4+ VIEW COMPARISON:  None Available. FINDINGS: Postsurgical changes from constrained right total knee arthroplasty with long stem tibial and femoral components. The tibial component appears anteriorly translated relative to the femoral component. No periprosthetic lucency or fracture identified. Patella Alta alignment. Diffuse soft tissue swelling. IMPRESSION: 1. Postsurgical changes from constrained right total knee arthroplasty. The tibial component is anteriorly translated relative to the femoral component. Orthopedic surgery evaluation is recommended. 2. Patella alta alignment. Appearance is concerning for underlying patellar tendon disruption. Electronically Signed   By: Davina Poke D.O.   On: 07/08/2022 14:52    Procedures Procedures    Medications Ordered in ED Medications  oxyCODONE (Oxy IR/ROXICODONE) immediate release tablet 10 mg (10 mg Oral Given 07/08/22 1600)    ED Course/ Medical Decision Making/ A&P Clinical Course as of 07/08/22 [place] Jul 08, 2022  1551 Page for [facility] [MT]  1604 I spoke to [facility] PA who is reviewed the case with her attending and they will plan to readmit the patient to their service, patient can eat today and will be n.p.o. at midnight for potential procedure tomorrow.  The patient was updated and agreement [MT]    Clinical Course User Index [MT] Wendee Hata, Carola Rhine, MD                             Medical Decision Making Amount and/or Complexity of Data Reviewed Labs: ordered. Radiology: ordered.  Risk Prescription drug management. Decision regarding hospitalization.   Patient is here with pain and swelling in her right  joint 2 weeks after an operation.  X-rays ordered and personally reviewed interpreted, there is question about potential patella alta or patellar ligament injury.  This does correlate with her inability to raise her lower leg on exam.  However she does report that she was unable to do this even after her operation.  I reviewed the patient's labs which showed no emergent findings.  Very mild leukocytosis which is likely reactive.  Have a low suspicion for postoperative infection with this presentation.  I discussed the case with the orthopedics consultant by phone who will be admitting the patient.  The patient is in agreement.  Oral pain medication  was given here in the ED        Final Clinical Impression(s) / ED Diagnoses Final diagnoses:  Acute pain of right knee    Rx / DC Orders ED Discharge Orders     None         Kumar Falwell, Carola Rhine, MD 07/08/22 [phone]

## 2022-07-08 NOTE — ED Triage Notes (Signed)
Pt BIB [facility] with c/o knee pain and swelling. Knee replacement sx done 06/25/2022. Pt unable to bear weight on that knee. Pt stated "feels like my knee is about to give out on me." Normal swelling after sx noted.   108 palp  RR 18 HR 99 98% RA

## 2022-07-09 ENCOUNTER — Inpatient Hospital Stay (HOSPITAL_COMMUNITY): Payer: Medicare Other | Admitting: Anesthesiology

## 2022-07-09 ENCOUNTER — Other Ambulatory Visit: Payer: Self-pay

## 2022-07-09 ENCOUNTER — Encounter (HOSPITAL_COMMUNITY)
Admission: EM | Disposition: A | Discharge status: Skilled Nursing Facility | Payer: Self-pay | Source: Home / Self Care | Attending: Orthopedic Surgery

## 2022-07-09 ENCOUNTER — Encounter (HOSPITAL_COMMUNITY): Payer: Self-pay | Admitting: Orthopedic Surgery

## 2022-07-09 DIAGNOSIS — M069 Rheumatoid arthritis, unspecified: Secondary | ICD-10-CM | POA: Diagnosis not present

## 2022-07-09 DIAGNOSIS — T84028A Dislocation of other internal joint prosthesis, initial encounter: Secondary | ICD-10-CM | POA: Diagnosis not present

## 2022-07-09 HISTORY — PX: TOTAL KNEE REVISION: SHX996

## 2022-07-09 LAB — SURGICAL PCR SCREEN
MRSA, PCR: NEGATIVE
Staphylococcus aureus: NEGATIVE

## 2022-07-09 LAB — GLUCOSE, CAPILLARY: Glucose-Capillary: 136 mg/dL — ABNORMAL HIGH (ref 70–99)

## 2022-07-09 SURGERY — TOTAL KNEE REVISION
Anesthesia: Regional | Site: Knee | Laterality: Right

## 2022-07-09 MED ORDER — CEFAZOLIN SODIUM-DEXTROSE 2-4 GM/100ML-% IV SOLN
2.0000 g | Freq: Four times a day (QID) | INTRAVENOUS | Status: AC
  Administered 2022-07-09 – 2022-07-10 (×2): 2 g via INTRAVENOUS
  Filled 2022-07-09 (×2): qty 100

## 2022-07-09 MED ORDER — METHOCARBAMOL 500 MG PO TABS
500.0000 mg | ORAL_TABLET | Freq: Four times a day (QID) | ORAL | Status: DC | PRN
  Administered 2022-07-09 – 2022-07-14 (×10): 500 mg via ORAL
  Filled 2022-07-09 (×11): qty 1

## 2022-07-09 MED ORDER — MIDAZOLAM HCL 2 MG/2ML IJ SOLN
1.0000 mg | INTRAMUSCULAR | Status: DC
  Administered 2022-07-09: 1 mg via INTRAVENOUS
  Filled 2022-07-09: qty 2

## 2022-07-09 MED ORDER — MENTHOL 3 MG MT LOZG
1.0000 | LOZENGE | OROMUCOSAL | Status: DC | PRN
  Administered 2022-07-09 – 2022-07-12 (×3): 3 mg via ORAL
  Filled 2022-07-09 (×4): qty 9

## 2022-07-09 MED ORDER — METHOCARBAMOL 500 MG IVPB - SIMPLE MED
INTRAVENOUS | Status: AC
  Administered 2022-07-09: 500 mg via INTRAVENOUS
  Filled 2022-07-09: qty 55

## 2022-07-09 MED ORDER — PROPOFOL 10 MG/ML IV BOLUS
INTRAVENOUS | Status: AC
  Filled 2022-07-09: qty 20

## 2022-07-09 MED ORDER — ONDANSETRON HCL 4 MG/2ML IJ SOLN: 4.0000 mg | Freq: Four times a day (QID) | INTRAMUSCULAR | Status: DC | PRN

## 2022-07-09 MED ORDER — PROPOFOL 1000 MG/100ML IV EMUL
INTRAVENOUS | Status: AC
  Filled 2022-07-09: qty 100

## 2022-07-09 MED ORDER — TRANEXAMIC ACID-NACL 1000-0.7 MG/100ML-% IV SOLN
1000.0000 mg | INTRAVENOUS | Status: AC
  Administered 2022-07-09: 1000 mg via INTRAVENOUS
  Filled 2022-07-09: qty 100

## 2022-07-09 MED ORDER — LIDOCAINE HCL (PF) 2 % IJ SOLN
INTRAMUSCULAR | Status: AC
  Filled 2022-07-09: qty 5

## 2022-07-09 MED ORDER — DEXAMETHASONE SODIUM PHOSPHATE 10 MG/ML IJ SOLN
10.0000 mg | Freq: Once | INTRAMUSCULAR | Status: AC
  Administered 2022-07-10: 10 mg via INTRAVENOUS
  Filled 2022-07-09: qty 1

## 2022-07-09 MED ORDER — ACETAMINOPHEN 10 MG/ML IV SOLN: 1000.0000 mg | Freq: Four times a day (QID) | INTRAVENOUS | Status: DC

## 2022-07-09 MED ORDER — METOCLOPRAMIDE HCL 5 MG/ML IJ SOLN: 5.0000 mg | Freq: Three times a day (TID) | INTRAMUSCULAR | Status: DC | PRN

## 2022-07-09 MED ORDER — LACTATED RINGERS IV SOLN: INTRAVENOUS | Status: DC

## 2022-07-09 MED ORDER — KETAMINE HCL 50 MG/5ML IJ SOSY
PREFILLED_SYRINGE | INTRAMUSCULAR | Status: AC
  Filled 2022-07-09: qty 5

## 2022-07-09 MED ORDER — 0.9 % SODIUM CHLORIDE (POUR BTL) OPTIME
TOPICAL | Status: DC | PRN
  Administered 2022-07-09: 1000 mL

## 2022-07-09 MED ORDER — SUGAMMADEX SODIUM 200 MG/2ML IV SOLN
INTRAVENOUS | Status: DC | PRN
  Administered 2022-07-09: 200 mg via INTRAVENOUS

## 2022-07-09 MED ORDER — ONDANSETRON HCL 4 MG PO TABS: 4.0000 mg | ORAL_TABLET | Freq: Four times a day (QID) | ORAL | Status: DC | PRN

## 2022-07-09 MED ORDER — FENTANYL CITRATE (PF) 250 MCG/5ML IJ SOLN
INTRAMUSCULAR | Status: AC
  Filled 2022-07-09: qty 5

## 2022-07-09 MED ORDER — LIP MEDEX EX OINT
TOPICAL_OINTMENT | CUTANEOUS | Status: DC | PRN
  Filled 2022-07-09: qty 7

## 2022-07-09 MED ORDER — DIPHENHYDRAMINE HCL 12.5 MG/5ML PO ELIX: 12.5000 mg | ORAL_SOLUTION | ORAL | Status: DC | PRN

## 2022-07-09 MED ORDER — PHENOL 1.4 % MT LIQD
1.0000 | OROMUCOSAL | Status: DC | PRN
  Administered 2022-07-09: 1 via OROMUCOSAL
  Filled 2022-07-09: qty 177

## 2022-07-09 MED ORDER — OXYCODONE HCL 5 MG/5ML PO SOLN: 5.0000 mg | Freq: Once | ORAL | Status: AC | PRN

## 2022-07-09 MED ORDER — SODIUM CHLORIDE 0.9 % IV SOLN: INTRAVENOUS | Status: DC

## 2022-07-09 MED ORDER — ONDANSETRON HCL 4 MG/2ML IJ SOLN: 4.0000 mg | Freq: Once | INTRAMUSCULAR | Status: DC | PRN

## 2022-07-09 MED ORDER — HYDROMORPHONE HCL 1 MG/ML IJ SOLN
INTRAMUSCULAR | Status: AC
  Administered 2022-07-09: 0.5 mg via INTRAVENOUS
  Filled 2022-07-09: qty 2

## 2022-07-09 MED ORDER — BISACODYL 10 MG RE SUPP
10.0000 mg | Freq: Every day | RECTAL | Status: DC | PRN
  Administered 2022-07-11: 10 mg via RECTAL
  Filled 2022-07-09: qty 1

## 2022-07-09 MED ORDER — METHOCARBAMOL 500 MG IVPB - SIMPLE MED: 500.0000 mg | Freq: Four times a day (QID) | INTRAVENOUS | Status: DC | PRN

## 2022-07-09 MED ORDER — DEXAMETHASONE SODIUM PHOSPHATE 10 MG/ML IJ SOLN
INTRAMUSCULAR | Status: AC
  Filled 2022-07-09: qty 1

## 2022-07-09 MED ORDER — DEXAMETHASONE SODIUM PHOSPHATE 10 MG/ML IJ SOLN
INTRAMUSCULAR | Status: DC | PRN
  Administered 2022-07-09: 10 mg

## 2022-07-09 MED ORDER — ONDANSETRON HCL 4 MG/2ML IJ SOLN
INTRAMUSCULAR | Status: AC
  Filled 2022-07-09: qty 2

## 2022-07-09 MED ORDER — HYDROMORPHONE HCL 1 MG/ML IJ SOLN
0.2500 mg | INTRAMUSCULAR | Status: DC | PRN
  Administered 2022-07-09 (×2): 0.5 mg via INTRAVENOUS

## 2022-07-09 MED ORDER — OXYCODONE HCL 5 MG PO TABS
ORAL_TABLET | ORAL | Status: AC
  Administered 2022-07-09: 5 mg via ORAL
  Filled 2022-07-09: qty 1

## 2022-07-09 MED ORDER — POLYETHYLENE GLYCOL 3350 17 G PO PACK
17.0000 g | PACK | Freq: Every day | ORAL | Status: DC | PRN
  Administered 2022-07-11: 17 g via ORAL
  Filled 2022-07-09: qty 1

## 2022-07-09 MED ORDER — FLEET ENEMA 7-19 GM/118ML RE ENEM: 1.0000 | ENEMA | Freq: Once | RECTAL | Status: DC | PRN

## 2022-07-09 MED ORDER — CEFAZOLIN SODIUM-DEXTROSE 2-4 GM/100ML-% IV SOLN
2.0000 g | INTRAVENOUS | Status: AC
  Administered 2022-07-09: 2 g via INTRAVENOUS
  Filled 2022-07-09: qty 100

## 2022-07-09 MED ORDER — CHLORHEXIDINE GLUCONATE 0.12 % MT SOLN
15.0000 mL | Freq: Once | OROMUCOSAL | Status: AC
  Administered 2022-07-09: 15 mL via OROMUCOSAL

## 2022-07-09 MED ORDER — DEXAMETHASONE SODIUM PHOSPHATE 10 MG/ML IJ SOLN: 8.0000 mg | Freq: Once | INTRAMUSCULAR | Status: DC

## 2022-07-09 MED ORDER — SODIUM CHLORIDE 0.9 % IR SOLN
Status: DC | PRN
  Administered 2022-07-09: 1000 mL

## 2022-07-09 MED ORDER — AMISULPRIDE (ANTIEMETIC) 5 MG/2ML IV SOLN: 10.0000 mg | Freq: Once | INTRAVENOUS | Status: DC | PRN

## 2022-07-09 MED ORDER — ROCURONIUM BROMIDE 10 MG/ML (PF) SYRINGE
PREFILLED_SYRINGE | INTRAVENOUS | Status: DC | PRN
  Administered 2022-07-09: 80 mg via INTRAVENOUS

## 2022-07-09 MED ORDER — RIVAROXABAN 10 MG PO TABS
10.0000 mg | ORAL_TABLET | Freq: Every day | ORAL | Status: DC
  Administered 2022-07-10 – 2022-07-14 (×5): 10 mg via ORAL
  Filled 2022-07-09 (×5): qty 1

## 2022-07-09 MED ORDER — METOCLOPRAMIDE HCL 5 MG PO TABS: 5.0000 mg | ORAL_TABLET | Freq: Three times a day (TID) | ORAL | Status: DC | PRN

## 2022-07-09 MED ORDER — LIDOCAINE 2% (20 MG/ML) 5 ML SYRINGE
INTRAMUSCULAR | Status: DC | PRN
  Administered 2022-07-09: 60 mg via INTRAVENOUS

## 2022-07-09 MED ORDER — ACETAMINOPHEN 500 MG PO TABS
1000.0000 mg | ORAL_TABLET | Freq: Once | ORAL | Status: AC
  Filled 2022-07-09: qty 2

## 2022-07-09 MED ORDER — OXYCODONE HCL 5 MG PO TABS: 5.0000 mg | ORAL_TABLET | Freq: Once | ORAL | Status: AC | PRN

## 2022-07-09 MED ORDER — DEXMEDETOMIDINE HCL IN NACL 80 MCG/20ML IV SOLN
INTRAVENOUS | Status: DC | PRN
  Administered 2022-07-09: 12 ug via BUCCAL

## 2022-07-09 MED ORDER — FENTANYL CITRATE PF 50 MCG/ML IJ SOSY
50.0000 ug | PREFILLED_SYRINGE | INTRAMUSCULAR | Status: DC
  Administered 2022-07-09: 50 ug via INTRAVENOUS
  Filled 2022-07-09: qty 2

## 2022-07-09 MED ORDER — DEXAMETHASONE SODIUM PHOSPHATE 10 MG/ML IJ SOLN
INTRAMUSCULAR | Status: DC | PRN
  Administered 2022-07-09: 10 mg via INTRAVENOUS

## 2022-07-09 MED ORDER — KETAMINE HCL 10 MG/ML IJ SOLN
INTRAMUSCULAR | Status: DC | PRN
  Administered 2022-07-09: 50 mg via INTRAVENOUS

## 2022-07-09 MED ORDER — DOCUSATE SODIUM 100 MG PO CAPS
100.0000 mg | ORAL_CAPSULE | Freq: Two times a day (BID) | ORAL | Status: DC
  Administered 2022-07-09 – 2022-07-14 (×10): 100 mg via ORAL
  Filled 2022-07-09 (×10): qty 1

## 2022-07-09 MED ORDER — FENTANYL CITRATE (PF) 250 MCG/5ML IJ SOLN
INTRAMUSCULAR | Status: DC | PRN
  Administered 2022-07-09 (×5): 50 ug via INTRAVENOUS

## 2022-07-09 MED ORDER — PHENYLEPHRINE HCL-NACL 20-0.9 MG/250ML-% IV SOLN
INTRAVENOUS | Status: DC | PRN
  Administered 2022-07-09: 25 ug/min via INTRAVENOUS

## 2022-07-09 MED ORDER — ACETAMINOPHEN 10 MG/ML IV SOLN
INTRAVENOUS | Status: DC | PRN
  Administered 2022-07-09: 1000 mg via INTRAVENOUS

## 2022-07-09 MED ORDER — PROPOFOL 10 MG/ML IV BOLUS
INTRAVENOUS | Status: DC | PRN
  Administered 2022-07-09: 150 mg via INTRAVENOUS

## 2022-07-09 MED ORDER — ROPIVACAINE HCL 5 MG/ML IJ SOLN
INTRAMUSCULAR | Status: DC | PRN
  Administered 2022-07-09: 30 mL via PERINEURAL

## 2022-07-09 SURGICAL SUPPLY — 55 items
BAG COUNTER SPONGE SURGICOUNT (BAG) IMPLANT
BAG SPEC THK2 15X12 ZIP CLS (MISCELLANEOUS)
BAG SPNG CNTER NS LX DISP (BAG)
BAG ZIPLOCK 12X15 (MISCELLANEOUS) IMPLANT
BLADE SAG 18X100X1.27 (BLADE) ×2 IMPLANT
BLADE SAW SGTL 11.0X1.19X90.0M (BLADE) ×2 IMPLANT
BLADE SURG SZ10 CARB STEEL (BLADE) IMPLANT
BNDG CMPR 5X62 HK CLSR LF (GAUZE/BANDAGES/DRESSINGS) ×1
BNDG CMPR MED 10X6 ELC LF (GAUZE/BANDAGES/DRESSINGS) ×1
BNDG ELASTIC 6INX 5YD STR LF (GAUZE/BANDAGES/DRESSINGS) ×2 IMPLANT
BNDG ELASTIC 6X10 VLCR STRL LF (GAUZE/BANDAGES/DRESSINGS) IMPLANT
COVER SURGICAL LIGHT HANDLE (MISCELLANEOUS) ×2 IMPLANT
CUFF TOURN SGL QUICK 34 (TOURNIQUET CUFF) ×1
CUFF TRNQT CYL 34X4.125X (TOURNIQUET CUFF) ×2 IMPLANT
DRAPE U-SHAPE 47X51 STRL (DRAPES) ×2 IMPLANT
DRSG AQUACEL AG ADV 3.5X10 (GAUZE/BANDAGES/DRESSINGS) ×2 IMPLANT
DURAPREP 26ML APPLICATOR (WOUND CARE) ×2 IMPLANT
ELECT REM PT RETURN 15FT ADLT (MISCELLANEOUS) ×2 IMPLANT
GLOVE BIO SURGEON STRL SZ 6.5 (GLOVE) IMPLANT
GLOVE BIO SURGEON STRL SZ7 (GLOVE) IMPLANT
GLOVE BIO SURGEON STRL SZ8 (GLOVE) ×4 IMPLANT
GLOVE BIOGEL PI IND STRL 7.0 (GLOVE) IMPLANT
GLOVE BIOGEL PI IND STRL 8 (GLOVE) ×2 IMPLANT
GOWN STRL REUS W/ TWL LRG LVL3 (GOWN DISPOSABLE) ×2 IMPLANT
GOWN STRL REUS W/TWL LRG LVL3 (GOWN DISPOSABLE) ×1
HANDPIECE INTERPULSE COAX TIP (DISPOSABLE)
HOLDER FOLEY CATH W/STRAP (MISCELLANEOUS) IMPLANT
IMMOBILIZER KNEE 20 (SOFTGOODS) ×1 IMPLANT
IMMOBILIZER KNEE 20 THIGH 36 (SOFTGOODS) ×2 IMPLANT
INSERT TIB CRS ATTUNE SZ3 18 (Insert) IMPLANT
KIT TURNOVER KIT A (KITS) IMPLANT
MANIFOLD NEPTUNE II (INSTRUMENTS) ×2 IMPLANT
NDL MA TROC 1/2 CIR (NEEDLE) IMPLANT
NEEDLE MA TROC 1/2 CIR (NEEDLE) ×1 IMPLANT
NS IRRIG 1000ML POUR BTL (IV SOLUTION) ×2 IMPLANT
PACK TOTAL KNEE CUSTOM (KITS) ×2 IMPLANT
PADDING CAST ABS COTTON 6X4 NS (CAST SUPPLIES) IMPLANT
PADDING CAST COTTON 6X4 STRL (CAST SUPPLIES) ×4 IMPLANT
PROTECTOR NERVE ULNAR (MISCELLANEOUS) ×2 IMPLANT
SET HNDPC FAN SPRY TIP SCT (DISPOSABLE) ×2 IMPLANT
SPIKE FLUID TRANSFER (MISCELLANEOUS) IMPLANT
STRIP CLOSURE SKIN 1/2X4 (GAUZE/BANDAGES/DRESSINGS) ×4 IMPLANT
SUT MNCRL AB 4-0 PS2 18 (SUTURE) ×2 IMPLANT
SUT STRATAFIX 0 PDS 27 VIOLET (SUTURE) ×1
SUT VIC AB 2-0 CT1 27 (SUTURE) ×3
SUT VIC AB 2-0 CT1 TAPERPNT 27 (SUTURE) ×6 IMPLANT
SUTURE STRATFX 0 PDS 27 VIOLET (SUTURE) ×2 IMPLANT
SWAB COLLECTION DEVICE MRSA (MISCELLANEOUS) IMPLANT
SWAB CULTURE ESWAB REG 1ML (MISCELLANEOUS) IMPLANT
TOWER CARTRIDGE SMART MIX (DISPOSABLE) ×2 IMPLANT
TRAY FOLEY MTR SLVR 16FR STAT (SET/KITS/TRAYS/PACK) ×2 IMPLANT
TUBE KAMVAC SUCTION (TUBING) IMPLANT
TUBE SUCTION HIGH CAP CLEAR NV (SUCTIONS) ×2 IMPLANT
WATER STERILE IRR 1000ML POUR (IV SOLUTION) ×2 IMPLANT
WRAP KNEE MAXI GEL POST OP (GAUZE/BANDAGES/DRESSINGS) IMPLANT

## 2022-07-09 NOTE — Anesthesia Preprocedure Evaluation (Addendum)
Anesthesia Evaluation  Patient identified by MRN, date of birth, ID band Patient awake    Reviewed: Allergy & Precautions, NPO status , Patient's Chart, lab work & pertinent test results  History of Anesthesia Complications (+) history of anesthetic complications (post spinal fusion 2008- irregular HR, marked as difficult airway but was not intubated for that surgery)  Airway Mallampati: III  TM Distance: >3 FB Neck ROM: Full    Dental  (+) Teeth Intact, Dental Advisory Given   Pulmonary neg pulmonary ROS   Pulmonary exam normal breath sounds clear to auscultation       Cardiovascular negative cardio ROS Normal cardiovascular exam Rhythm:Regular Rate:Normal     Neuro/Psych  PSYCHIATRIC DISORDERS  Depression    negative neurological ROS     GI/Hepatic negative GI ROS, Neg liver ROS,,,  Endo/Other  negative endocrine ROS    Renal/GU negative Renal ROS  negative genitourinary   Musculoskeletal  (+) Arthritis , Osteoarthritis and Rheumatoid disorders,    Abdominal   Peds  Hematology negative hematology ROS (+) Hb 13.1, plt 280 Xarelto LD yesterday   Anesthesia Other Findings   Reproductive/Obstetrics negative OB ROS                             Anesthesia Physical Anesthesia Plan  ASA: 3  Anesthesia Plan: Regional and General   Post-op Pain Management: Regional block* and Tylenol PO (pre-op)*   Induction:   PONV Risk Score and Plan: 2 and Midazolam, Ondansetron, Dexamethasone and Treatment may vary due to age or medical condition  Airway Management Planned: Oral ETT  Additional Equipment: None  Intra-op Plan:   Post-operative Plan: Extubation in OR  Informed Consent: I have reviewed the patients History and Physical, chart, labs and discussed the procedure including the risks, benefits and alternatives for the proposed anesthesia with the patient or authorized representative who  has indicated his/her understanding and acceptance.     Dental advisory given  Plan Discussed with: CRNA  Anesthesia Plan Comments: (Labeled difficult airway 12/04/21 but had spinal for that anesthetic, was not intubated. Airway exam nonreassuring, so will go directly to glidescope. Has not been off anticoagulation for appropriate amount of time for spinal)        Anesthesia Quick Evaluation

## 2022-07-09 NOTE — Plan of Care (Signed)

## 2022-07-09 NOTE — Discharge Instructions (Addendum)
Ollen Gross, MD [facility] [place] [address], [address] [place], [place] [address] [phone]  POSTOPERATIVE DIRECTIONS  Maintain knee immobilizer to the right knee at all times. No range of motion to the right knee.  HOME CARE INSTRUCTIONS  Remove items at home which could result in a fall. This includes throw rugs or furniture in walking pathways.  ICE to the affected knee as much as tolerated. Icing helps control swelling. If the swelling is well controlled you will be more comfortable and rehab easier. Continue to use ice on the knee for pain and swelling from surgery. You may notice swelling that will progress down to the foot and ankle. This is normal after surgery. Elevate the leg when you are not up walking on it.    Continue to use the breathing machine which will help keep your temperature down. It is common for your temperature to cycle up and down following surgery, especially at night when you are not up moving around and exerting yourself. The breathing machine keeps your lungs expanded and your temperature down. Do not place pillow under the operative knee, focus on keeping the knee straight while resting  DIET You may resume your previous home diet once you are discharged from the hospital.  DRESSING / WOUND CARE / SHOWERING Keep your bulky bandage on for 2 days. On the third post-operative day you may remove the Ace bandage and gauze. There is a waterproof adhesive bandage on your skin which will stay in place until your first follow-up appointment. Once you remove this you will not need to place another bandage You may begin showering 3 days following surgery, but do not submerge the incision under water.  ACTIVITY For the first 5 days, the key is rest and control of pain and swelling Do your home exercises twice a day starting on post-operative day 3. On the days you go to physical therapy, just do the home exercises once that  day. You should rest, ice and elevate the leg for 50 minutes out of every hour. Get up and walk/stretch for 10 minutes per hour. After 5 days you can increase your activity slowly as tolerated. Walk with your walker as instructed. Use the walker until you are comfortable transitioning to a cane. Walk with the cane in the opposite hand of the operative leg. You may discontinue the cane once you are comfortable and walking steadily. Avoid periods of inactivity such as sitting longer than an hour when not asleep. This helps prevent blood clots.  You may resume a sexual relationship in one month or when given the OK by your doctor.  You may return to work once you are cleared by your doctor.  Do not drive a car for 6 weeks or until released by your surgeon.  Do not drive while taking narcotics.  TED HOSE STOCKINGS Wear the elastic stockings on both legs for three weeks following surgery during the day. You may remove them at night for sleeping.  WEIGHT BEARING Weight bearing as tolerated with assist device (walker, cane, etc) as directed, use it as long as suggested by your surgeon or therapist, typically at least 4-6 weeks.  POSTOPERATIVE CONSTIPATION PROTOCOL Constipation - defined medically as fewer than three stools per week and severe constipation as less than one stool per week.  One of the most common issues patients have following surgery is constipation.  Even if you have a regular bowel pattern at home, your normal regimen  is likely to be disrupted due to multiple reasons following surgery.  Combination of anesthesia, postoperative narcotics, change in appetite and fluid intake all can affect your bowels.  In order to avoid complications following surgery, here are some recommendations in order to help you during your recovery period.  Colace (docusate) - Pick up an over-the-counter form of Colace or another stool softener and take twice a day as long as you are requiring postoperative pain  medications.  Take with a full glass of water daily.  If you experience loose stools or diarrhea, hold the colace until you stool forms back up. If your symptoms do not get better within 1 week or if they get worse, check with your doctor. Dulcolax (bisacodyl) - Pick up over-the-counter and take as directed by the product packaging as needed to assist with the movement of your bowels.  Take with a full glass of water.  Use this product as needed if not relieved by Colace only.  MiraLax (polyethylene glycol) - Pick up over-the-counter to have on hand. MiraLax is a solution that will increase the amount of water in your bowels to assist with bowel movements.  Take as directed and can mix with a glass of water, juice, soda, coffee, or tea. Take if you go more than two days without a movement. Do not use MiraLax more than once per day. Call your doctor if you are still constipated or irregular after using this medication for 7 days in a row.  If you continue to have problems with postoperative constipation, please contact the office for further assistance and recommendations.  If you experience "the worst abdominal pain ever" or develop nausea or vomiting, please contact the office immediatly for further recommendations for treatment.  ITCHING If you experience itching with your medications, try taking only a single pain pill, or even half a pain pill at a time.  You can also use Benadryl over the counter for itching or also to help with sleep.   MEDICATIONS See your medication summary on the "After Visit Summary" that the nursing staff will review with you prior to discharge.  You may have some home medications which will be placed on hold until you complete the course of blood thinner medication.  It is important for you to complete the blood thinner medication as prescribed by your surgeon.  Continue your approved medications as instructed at time of discharge.  PRECAUTIONS If you experience chest pain  or shortness of breath - call 911 immediately for transfer to the hospital emergency department.  If you develop a fever greater that 101 F, purulent drainage from wound, increased redness or drainage from wound, foul odor from the wound/dressing, or calf pain - CONTACT YOUR SURGEON.                                                   FOLLOW-UP APPOINTMENTS Make sure you keep all of your appointments after your operation with your surgeon and caregivers. You should call the office at the above phone number and make an appointment for approximately two weeks after the date of your surgery or on the date instructed by your surgeon outlined in the "After Visit Summary".  POST-OPERATIVE OPIOID TAPER INSTRUCTIONS: It is important to wean off of your opioid medication as soon as possible. If you do not need pain  medication after your surgery it is ok to stop day one. Opioids include: Codeine, Hydrocodone(Norco, Vicodin), Oxycodone(Percocet, oxycontin) and hydromorphone amongst others.  Long term and even short term use of opiods can cause: Increased pain response Dependence Constipation Depression Respiratory depression And more.  Withdrawal symptoms can include Flu like symptoms Nausea, vomiting And more Techniques to manage these symptoms Hydrate well Eat regular healthy meals Stay active Use relaxation techniques(deep breathing, meditating, yoga) Do Not substitute Alcohol to help with tapering If you have been on opioids for less than two weeks and do not have pain than it is ok to stop all together.  Plan to wean off of opioids This plan should start within one week post op of your joint replacement. Maintain the same interval or time between taking each dose and first decrease the dose.  Cut the total daily intake of opioids by one tablet each day Next start to increase the time between doses. The last dose that should be eliminated is the evening dose.   IF YOU ARE TRANSFERRED TO A  SKILLED REHAB FACILITY If the patient is transferred to a skilled rehab facility following release from the hospital, a list of the current medications will be sent to the facility for the patient to continue.  When discharged from the skilled rehab facility, please have the facility set up the patient's Home Health Physical Therapy prior to being released. Also, the skilled facility will be responsible for providing the patient with their medications at time of release from the facility to include their pain medication, the muscle relaxants, and their blood thinner medication. If the patient is still at the rehab facility at time of the two week follow up appointment, the skilled rehab facility will also need to assist the patient in arranging follow up appointment in our office and any transportation needs.  MAKE SURE YOU:  Understand these instructions.  Get help right away if you are not doing well or get worse.   DENTAL ANTIBIOTICS:  In most cases prophylactic antibiotics for Dental procdeures after total joint surgery are not necessary.  Exceptions are as follows:  1. History of prior total joint infection  2. Severely immunocompromised (Organ Transplant, cancer chemotherapy, Rheumatoid biologic meds such as Humera)  3. Poorly controlled diabetes (A1C &gt; 8.0, blood glucose over 200)  If you have one of these conditions, contact your surgeon for an antibiotic prescription, prior to your dental procedure.    Pick up stool softner and laxative for home use following surgery while on pain medications. Do not submerge incision under water. Please use good hand washing techniques while changing dressing each day. May shower starting three days after surgery. Please use a clean towel to pat the incision dry following showers. Continue to use ice for pain and swelling after surgery. Do not use any lotions or creams on the incision until instructed by your surgeon.  Information on my  medicine - XARELTO (Rivaroxaban)  This medication education was reviewed with me or my healthcare representative as part of my discharge preparation.    Why was Xarelto prescribed for you? Xarelto was prescribed for you to reduce the risk of blood clots forming after orthopedic surgery. The medical term for these abnormal blood clots is venous thromboembolism (VTE).  What do you need to know about xarelto ? Take your Xarelto ONCE DAILY at the same time every day. You may take it either with or without food.  If you have difficulty swallowing the tablet whole,  you may crush it and mix in applesauce just prior to taking your dose.  Take Xarelto exactly as prescribed by your doctor and DO NOT stop taking Xarelto without talking to the doctor who prescribed the medication.  Stopping without other VTE prevention medication to take the place of Xarelto may increase your risk of developing a clot.  After discharge, you should have regular check-up appointments with your healthcare provider that is prescribing your Xarelto.    What do you do if you miss a dose? If you miss a dose, take it as soon as you remember on the same day then continue your regularly scheduled once daily regimen the next day. Do not take two doses of Xarelto on the same day.   Important Safety Information A possible side effect of Xarelto is bleeding. You should call your healthcare provider right away if you experience any of the following: Bleeding from an injury or your nose that does not stop. Unusual colored urine (red or dark brown) or unusual colored stools (red or black). Unusual bruising for unknown reasons. A serious fall or if you hit your head (even if there is no bleeding).  Some medicines may interact with Xarelto and might increase your risk of bleeding while on Xarelto. To help avoid this, consult your healthcare provider or pharmacist prior to using any new prescription or non-prescription  medications, including herbals, vitamins, non-steroidal anti-inflammatory drugs (NSAIDs) and supplements.  This website has more information on Xarelto: [email or web address].xarelto.[email or web address].

## 2022-07-09 NOTE — Anesthesia Postprocedure Evaluation (Signed)
Anesthesia Post Note  Patient: [name] [name]  Procedure(s) Performed: RIGHT OPEN REDUCTION OF KNEE DISLOCATION, POSSIBLE POLY REVSION (Right: Knee)     Patient location during evaluation: PACU Anesthesia Type: Regional and General Level of consciousness: awake and alert, oriented and patient cooperative Pain management: pain level controlled Vital Signs Assessment: post-procedure vital signs reviewed and stable Respiratory status: spontaneous breathing, nonlabored ventilation and respiratory function stable Cardiovascular status: blood pressure returned to baseline and stable Postop Assessment: no apparent nausea or vomiting Anesthetic complications: yes   Encounter Notable Events  Notable Event Outcome Phase Comment  Difficult to intubate - expected  Intraprocedure Filed from anesthesia note documentation.    Last Vitals:  Vitals:   07/09/22 1841 07/09/22 1852  BP: 126/83 130/74  Pulse: 62 65  Resp: 11 18  Temp: 36.6 C (!) 36.4 C  SpO2: 100% 99%    Last Pain:  Vitals:   07/09/22 1852  TempSrc: Oral  PainSc:     LLE Motor Response: Purposeful movement (07/09/22 1855) LLE Sensation: Full sensation (07/09/22 1855) RLE Motor Response: Purposeful movement (07/09/22 1855) RLE Sensation: Full sensation (07/09/22 1855) L Sensory Level: S1-Sole of foot, small toes (07/09/22 1855) R Sensory Level: S1-Sole of foot, small toes (07/09/22 1855)  [place]

## 2022-07-09 NOTE — Anesthesia Procedure Notes (Signed)
Anesthesia Regional Block: Adductor canal block   Pre-Anesthetic Checklist: , timeout performed,  Correct Patient, Correct Site, Correct Laterality,  Correct Procedure, Correct Position, site marked,  Risks and benefits discussed,  Surgical consent,  Pre-op evaluation,  At surgeon's request and post-op pain management  Laterality: Right  Prep: Maximum Sterile Barrier Precautions used, chloraprep       Needles:  Injection technique: Single-shot  Needle Type: Echogenic Stimulator Needle     Needle Length: 9cm  Needle Gauge: 22     Additional Needles:   Procedures:,,,, ultrasound used (permanent image in chart),,    Narrative:  Start time: 07/09/2022 2:55 PM End time: 07/09/2022 3:00 PM Injection made incrementally with aspirations every 5 mL.  Performed by: Personally  Anesthesiologist: Pervis Hocking, DO  Additional Notes: Monitors applied. No increased pain on injection. No increased resistance to injection. Injection made in 5cc increments. Good needle visualization. Patient tolerated procedure well.

## 2022-07-09 NOTE — Op Note (Unsigned)
NAME: [name] [name], [name] [name]. MEDICAL RECORD NO: [id number] ACCOUNT NO: [id number] DATE OF BIRTH: 08-29-1960 FACILITY: [facility] PHYSICIAN: Dione Plover. Noell Lorensen, MD  Operative Report   DATE OF PROCEDURE: 07/09/2022  PREOPERATIVE DIAGNOSIS:  Right total knee arthroplasty dislocation.  POSTOPERATIVE DIAGNOSES:  Right total knee arthroplasty dislocation.  Patellar tendon tear.  PROCEDURE:  Right knee polyethylene revision and patellar tendon repair.  SURGEON:  Dione Plover. Milka Windholz, MD  ASSISTANT:  Jaynie Bream, PA-C  ANESTHESIA:  General.  ESTIMATED BLOOD LOSS:  25 mL  DRAINS:  None.  TOURNIQUET TIME:  26 minutes at 300 mmHg.  COMPLICATIONS:  None.  CONDITION:  Stable to recovery.  BRIEF CLINICAL NOTE:  The patient is a 62 year old female who underwent a right total knee arthroplasty revision 2 weeks ago.  She was doing extremely well and then 2 nights ago noted a pop and inability to bear weight since.  She was seen in the  Emergency Room yesterday afternoon and noted to have a dislocation of the prosthesis.  She was neurovascularly intact.  She presents today for the above-mentioned procedure.  DESCRIPTION OF PROCEDURE:  After successful administration of general anesthetic, tourniquet was placed on her right thigh and right lower extremity prepped and draped in the usual sterile fashion.  I was able to reduce the knee and it felt stable.  I  wrapped the leg in Esmarch and inflated the tourniquet to 300 mmHg.  Previous incisions were utilized.  Skin cut with a 10 blade through subcutaneous tissue to the level of the extensor mechanism. The patella appeared to be in a superiorly elevated  position consistent with possible tendon rupture.  In fact, the tendon had ruptured off the tibial tubercle with a soft tissue cuff still present on the tubercle.  The medial retinacular repair was otherwise intact.  She had a significant amount of  varus, valgus laxity and AP laxity due to the  tendon tear.  I used a fresh blade to recut the medial arthrotomy and the patella was then subluxed laterally to gain access to the components.  We subluxed the tibia anteriorly, removed the polyethylene,  which was a 14 mm thick size 3 for the Attune revision tibia.  This was removed and I did a trial up to 18 mm. With 18 full extension was achieved with excellent varus, valgus, and anterior, posterior balance throughout full range of motion.  The  permanent 18 mm revision rotating platform posterior stabilized polyethylene was placed and the knee was reduced with outstanding stability throughout full range of motion.  Knee was then copiously irrigated with saline with a liter with pulsatile  lavage.  We then took two #2 FiberWire sutures and starting at the inferior pole of the patella and one suture coursing on the medial aspect of the tendon, one suture coursing in the lateral aspect of the tension, I did a Bunnell weave from proximal to  distal through the patellar tendon.  We then passed the sutures through bone and through that soft tissue cuff that was present on the tibial tubercle.  All 4 strands of the suture were passed through.  The tendon was then tied down to the bone with a  very stable repair.  I then oversewed this.  We then irrigated further and closed the arthrotomy with a running 0 Stratafix suture.  I was able to flex the knee down to about 75-80 degrees without any excess tension on the repair and the repair remained  intact.  We  irrigated further and then released the tourniquet for a total time of 26 minutes.  Subcutaneous was then closed with interrupted 2-0 Vicryl and skin closed with staples.  Incision was then cleaned and dried and sterile dressing applied.  Note that a surgical assistant was of medical necessity for this procedure.  Assistant was necessary for retraction of vital ligaments and neurovascular structures and for proper positioning of the limb for revision of the  component and also for repair  of the patellar tendon,      PAA D: 07/09/2022 5:04:55 pm T: 07/09/2022 10:17:00 pm  JOB: [id number]

## 2022-07-09 NOTE — Brief Op Note (Signed)
07/08/2022 - 07/09/2022  4:58 PM  PATIENT:  [name] [name]  62 y.o. female  PRE-OPERATIVE DIAGNOSIS:  RIGHT KNEE DISLOCATION  POST-OPERATIVE DIAGNOSIS:  RIGHT KNEE DISLOCATION and patella tendon tear  PROCEDURE:  Right knee polyethylene revision and patella tendon repair   SURGEON:  Surgeon(s) and Role:    Gaynelle Arabian, MD - Primary  PHYSICIAN ASSISTANT:   ASSISTANTS: Jaynie Bream, PA-C   ANESTHESIA:   general  EBL:  25 ml  BLOOD ADMINISTERED:none  DRAINS: none   LOCAL MEDICATIONS USED:  NONE  COUNTS:  YES  TOURNIQUET:   Total Tourniquet Time Documented: Thigh (Right) - 26 minutes Total: Thigh (Right) - 26 minutes   DICTATION: .Other Dictation: Dictation Number [id number]  PLAN OF CARE: Admit to inpatient   PATIENT DISPOSITION:  PACU - hemodynamically stable.

## 2022-07-09 NOTE — Anesthesia Procedure Notes (Addendum)
Procedure Name: Intubation Date/Time: 07/09/2022 4:08 PM  Performed by: Sharlette Dense, CRNAPre-anesthesia Checklist: Patient identified, Emergency Drugs available, Suction available and Patient being monitored Patient Re-evaluated:Patient Re-evaluated prior to induction Oxygen Delivery Method: Circle system utilized Preoxygenation: Pre-oxygenation with 100% oxygen Induction Type: IV induction Ventilation: Mask ventilation without difficulty and Oral airway inserted - appropriate to patient size Laryngoscope Size: 3 and Glidescope Grade View: Grade I Tube type: Parker flex tip Laser Tube: Cuffed inflated with minimal occlusive pressure - saline Tube size: 7.0 mm Number of attempts: 1 Airway Equipment and Method: Stylet and Video-laryngoscopy Placement Confirmation: ETT inserted through vocal cords under direct vision, positive ETCO2 and breath sounds checked- equal and bilateral Secured at: 22 cm Tube secured with: Tape Dental Injury: Teeth and Oropharynx as per pre-operative assessment and Injury to lip  Difficulty Due To: Difficulty was anticipated, Difficult Airway- due to anterior larynx, Difficult Airway- due to large tongue, Difficult Airway- due to reduced neck mobility, Difficult Airway- due to limited oral opening and Difficult Airway- due to dentition Comments: Anticipated difficulty with airway given nonreassuring airway exam in preop. Easy mask ventilation, went directly to glidescope which was inserted carefully through small mouth opening. Grade 1 view and ETT easily passed through vocal cords, at which point the rigid stylet was removed. After removal of stylet, ETT was somewhat difficult to advance further- not because of resistance, but rather due to anterior larynx and inability to flex head and neck. ETT required a lot of twisting in order to advance further.

## 2022-07-09 NOTE — H&P (Signed)
[name] [name] is an 62 y.o. female.   Chief Complaint: Right knee pain HPI: 62 yo female who had a right TKA revision 2 weeks ago and was doing well until 2 days ago when she felt a pop in her right knee in bed and was unable to bear weight without pain since then. She did not have any injury leading to this and states she was doing well until that episode. She has not had any fever, chills or infectious symptoms. She went to the ED yesterday where radiographs showed a right knee dislocation. She was admitted to the hospital and will go to surgery today for open reduction and possible polyethylene revision.   Past Medical History:  Diagnosis Date   Complication of anesthesia    "irregular heartbeat" during neck surgery   Depression    Fibroid    Rheumatoid arthritis ([place])    Urinary incontinence     Past Surgical History:  Procedure Laterality Date   ABDOMINAL HYSTERECTOMY N/A 09/03/2012   Procedure: HYSTERECTOMY ABDOMINAL;  Surgeon: Marvene Staff, MD;  Location: [facility];  Service: Gynecology;  Laterality: N/A;   FOOT SURGERY Bilateral 2004   joint implants, each foot x 2. previous surgeries done on feet 1987   FOOT SURGERY Right 2008   KNEE SURGERY Right 2006   fracture knee after replacement, repaired   POSTERIOR FUSION CERVICAL SPINE  2006   REPLACEMENT TOTAL KNEE Bilateral 1989   left 1989; right 1996   SALPINGOOPHORECTOMY Bilateral 09/03/2012   Procedure: SALPINGO OOPHORECTOMY;  Surgeon: Marvene Staff, MD;  Location: [facility];  Service: Gynecology;  Laterality: Bilateral;   TONSILLECTOMY  1978   TOTAL KNEE REVISION Left 12/04/2021   Procedure: TOTAL KNEE REVISION;  Surgeon: Gaynelle Arabian, MD;  Location: [facility];  Service: Orthopedics;  Laterality: Left;   TOTAL KNEE REVISION Right 06/25/2022   Procedure: TOTAL KNEE REVISION;  Surgeon: Gaynelle Arabian, MD;  Location: [facility];  Service: Orthopedics;  Laterality: Right;    Family History  Problem Relation Age of  Onset   Ovarian cancer Mother 85   Lung cancer Father        asbestos   Cancer Brother 42       appendiceal   Seizures Brother    Heart Problems Brother    Colon cancer Maternal Aunt    Breast cancer Paternal Aunt 42   Colon cancer Maternal Grandmother 80   Stroke Paternal Grandmother    Heart disease Paternal Grandmother    Breast cancer Paternal Aunt 74   Social History:  reports that she has never smoked. She has never used smokeless tobacco. She reports current alcohol use. She reports that she does not use drugs.  Allergies:  Allergies  Allergen Reactions   Sulfa Antibiotics Shortness Of Breath   Morphine And Related Rash   Penicillins Rash    Tolerated Cephalosporin Date: 12/05/21.      Medications Prior to Admission  Medication Sig Dispense Refill   acetaminophen (TYLENOL) 500 MG tablet Take 1,000 mg by mouth every 6 (six) hours as needed for pain.     citalopram (CELEXA) 20 MG tablet Take 1 tablet (20 mg total) by mouth daily. (Patient taking differently: Take 20 mg by mouth daily as needed (depression).) 30 tablet 5   folic acid (FOLVITE) 1 MG tablet Take 2 mg by mouth daily.     gabapentin (NEURONTIN) 300 MG capsule Take 1 capsule 3 times a day for 2 weeks following surgery.Then take  1 capsule 2 times a day for 2 weeks. Then take 1 capsule daily for 2 weeks. Then discontinue. (Patient taking differently: Take 300 mg by mouth 2 (two) times daily.) 81 capsule 0   methocarbamol (ROBAXIN) 500 MG tablet Take 1 tablet (500 mg total) by mouth every 6 (six) hours as needed for muscle spasms. 40 tablet 0   methotrexate (RHEUMATREX) 2.5 MG tablet TAKE 7 TABLETS(17.5 MG TOTAL) BY MOUTH ONCE A WEEK. PROTECT FROM LIGHT (Patient taking differently: Take 17.5 mg by mouth once a week.) 84 tablet 0   ORENCIA CLICKJECT 0000000 MG/ML SOAJ INJECT ONE CLICKJECT PEN UNDER THE SKIN EVERY WEEK. REFRIGERATE. ALLOW TO WARM TO ROOM TEMPERATURE PRIOR TO ADMINISTRATION. (Patient taking differently:  Inject 1 Pen into the skin once a week. INJECT ONE CLICKJECT PEN UNDER THE SKIN EVERY WEEK. REFRIGERATE. ALLOW TO WARM TO ROOM TEMPERATURE PRIOR TO ADMINISTRATION.) 12 mL 0   oxyCODONE (OXY IR/ROXICODONE) 5 MG immediate release tablet Take 1-2 tablets (5-10 mg total) by mouth every 6 (six) hours as needed for severe pain. 42 tablet 0   rivaroxaban (XARELTO) 10 MG TABS tablet Take 1 tablet (10 mg total) by mouth daily with breakfast for 19 days. Then take an 81 mg aspirin once a day for 3 weeks. Then discontinue. (Patient taking differently: Take 10 mg by mouth daily with breakfast.) 19 tablet 0    Results for orders placed or performed during the hospital encounter of 07/08/22 (from the past 48 hour(s))  Basic metabolic panel     Status: Abnormal   Collection Time: 07/08/22  4:04 PM  Result Value Ref Range   Sodium 135 135 - 145 mmol/L   Potassium 4.3 3.5 - 5.1 mmol/L   Chloride 103 98 - 111 mmol/L   CO2 24 22 - 32 mmol/L   Glucose, Bld 102 (H) 70 - 99 mg/dL    Comment: Glucose reference range applies only to samples taken after fasting for at least 8 hours.   BUN 12 6 - 20 mg/dL   Creatinine, Ser 0.72 0.44 - 1.00 mg/dL   Calcium 9.2 8.9 - 10.3 mg/dL   GFR, Estimated >60 >60 mL/min    Comment: (NOTE) Calculated using the CKD-EPI Creatinine Equation (2021)    Anion gap 8 5 - 15    Comment: Performed at [facility], [place] [address] [place], [place] [address]  CBC     Status: Abnormal   Collection Time: 07/08/22  4:04 PM  Result Value Ref Range   WBC 12.8 (H) 4.0 - 10.5 K/uL   RBC 4.36 3.87 - 5.11 MIL/uL   Hemoglobin 12.1 12.0 - 15.0 g/dL   HCT 38.8 36.0 - 46.0 %   MCV 89.0 80.0 - 100.0 fL   MCH 27.8 26.0 - 34.0 pg   MCHC 31.2 30.0 - 36.0 g/dL   RDW 15.7 (H) 11.5 - 15.5 %   Platelets 524 (H) 150 - 400 K/uL   nRBC 0.0 0.0 - 0.2 %    Comment: Performed at [facility], [place] [address] [place], [place] [address]  HIV Antibody (routine testing w  rflx)     Status: None   Collection Time: 07/08/22  7:26 PM  Result Value Ref Range   HIV Screen 4th Generation wRfx Non Reactive Non Reactive    Comment: Performed at [facility], [place] [address] [place], [place] [address]  Surgical pcr screen     Status: None   Collection Time:  07/09/22  5:18 AM   Specimen: Nasal Mucosa; Nasal Swab  Result Value Ref Range   MRSA, PCR NEGATIVE NEGATIVE   Staphylococcus aureus NEGATIVE NEGATIVE    Comment: (NOTE) The Xpert SA Assay (FDA approved for NASAL specimens in patients 59 years of age and older), is one component of a comprehensive surveillance program. It is not intended to diagnose infection nor to guide or monitor treatment. Performed at [facility], [place] [address], [place], [place] [address]    DG Knee Complete 4 Views Right  Result Date: 07/08/2022 CLINICAL DATA:  Right knee pain. History of knee surgery 2 weeks ago EXAM: RIGHT KNEE - COMPLETE 4+ VIEW COMPARISON:  None Available. FINDINGS: Postsurgical changes from constrained right total knee arthroplasty with long stem tibial and femoral components. The tibial component appears anteriorly translated relative to the femoral component. No periprosthetic lucency or fracture identified. Patella Alta alignment. Diffuse soft tissue swelling. IMPRESSION: 1. Postsurgical changes from constrained right total knee arthroplasty. The tibial component is anteriorly translated relative to the femoral component. Orthopedic surgery evaluation is recommended. 2. Patella alta alignment. Appearance is concerning for underlying patellar tendon disruption. Electronically Signed   By: Davina Poke D.O.   On: 07/08/2022 14:52    Review of Systems  Blood pressure 121/65, pulse 64, temperature 98.1 F (36.7 C), temperature source Oral, resp. rate 18, height 5\' 2"  (1.575 m), weight 78.8 kg, last menstrual period 04/07/2012, SpO2 97 %. Physical Exam  Physical Examination: General  appearance - alert, well appearing, and in no distress Mental status - alert, oriented to person, place, and time Chest - clear to auscultation, no wheezes, rales or rhonchi, symmetric air entry Heart - normal rate, regular rhythm, normal S1, S2, no murmurs, rubs, clicks or gallops Abdomen - soft, nontender, nondistended, no masses or organomegaly Neurological - alert, oriented, normal speech, no focal findings or movement disorder noted Right knee- No effusion or warmth. Post-op dressing intact without drainage on it; no surrounding erythema; pain on any attempted ROM; pulses and sensation intact distally; EHL/FHL/TA and gastroc function intact   Assessment/Plan Right TKA dislocation post-revision TKA- This is an extremely unusual presentation in that it occurred atraumatically. She potentially has a disruption in the extensor mechanism which can predispose to this but had no trauma or episode where she recalls that this could have happened. Plan will be for open reduction and polyethylene revision to a thicker poly with repair of any possible extensor disruption. Discussed in detail with the patient this morning who elects to proceed with surgical treatment  Gaynelle Arabian, MD 07/09/2022, 6:58 AM

## 2022-07-09 NOTE — Transfer of Care (Signed)
Immediate Anesthesia Transfer of Care Note  Patient: [name] [name]  Procedure(s) Performed: Procedure(s): RIGHT OPEN REDUCTION OF KNEE DISLOCATION, POSSIBLE POLY REVSION (Right)  Patient Location: PACU  Anesthesia Type:General  Level of Consciousness: Alert, Awake, Oriented  Airway & Oxygen Therapy: Patient Spontanous Breathing  Post-op Assessment: Report given to RN  Post vital signs: Reviewed and stable  Last Vitals:  Vitals:   07/09/22 1510 07/09/22 1515  BP: 111/61 114/61  Pulse: 64 65  Resp: (!) 21 (!) 25  Temp:    SpO2: [id number]    Complications: No apparent anesthesia complications

## 2022-07-10 ENCOUNTER — Encounter (HOSPITAL_COMMUNITY): Payer: Self-pay | Admitting: Orthopedic Surgery

## 2022-07-10 LAB — BASIC METABOLIC PANEL
Anion gap: 6 (ref 5–15)
BUN: 14 mg/dL (ref 6–20)
CO2: 25 mmol/L (ref 22–32)
Calcium: 8.6 mg/dL — ABNORMAL LOW (ref 8.9–10.3)
Chloride: 104 mmol/L (ref 98–111)
Creatinine, Ser: 0.72 mg/dL (ref 0.44–1.00)
GFR, Estimated: >60 mL/min (ref >60)
Glucose, Bld: 149 mg/dL — ABNORMAL HIGH (ref 70–99)
Potassium: 4.4 mmol/L (ref 3.5–5.1)
Sodium: 135 mmol/L (ref 135–145)

## 2022-07-10 LAB — CBC
HCT: 36 % (ref 36.0–46.0)
Hemoglobin: 11.3 g/dL — ABNORMAL LOW (ref 12.0–15.0)
MCH: 28.4 pg (ref 26.0–34.0)
MCHC: 31.4 g/dL (ref 30.0–36.0)
MCV: 90.5 fL (ref 80.0–100.0)
Platelets: 466 10*3/uL — ABNORMAL HIGH (ref 150–400)
RBC: 3.98 MIL/uL (ref 3.87–5.11)
RDW: 15.4 % (ref 11.5–15.5)
WBC: 9.3 10*3/uL (ref 4.0–10.5)
nRBC: 0 % (ref 0.0–0.2)

## 2022-07-10 NOTE — Evaluation (Signed)
Physical Therapy Evaluation Patient Details Name: [name] [name] MRN: [id number] DOB: 01/13/1961 Today's Date: 07/10/2022  History of Present Illness  Pt is 62 yo female s/p right knee polyethylene revision and patellar tendon repair on 07/09/22 and recently had R TKA revision on 06/25/22.  Pt with hx including but not limited to RA, R TKA 1996, L TKA 1989 with revision 11/2021, C spine fusion, and prior foot surgeries  Clinical Impression  Patient is s/p above surgery resulting in functional limitations due to the deficits listed below (see PT Problem List).  Patient will benefit from acute skilled PT to increase their independence and safety with mobility to facilitate discharge.  Pt educated on no knee flexion and use of KI.  Pt reports fear of falling however agreeable to stand at edge of bed today.  Pt reports her son works 12 hours and was staying with her after recent surgery 3/20 however she excepts to need more assist upon d/c.        Recommendations for follow up therapy are one component of a multi-disciplinary discharge planning process, led by the attending physician.  Recommendations may be updated based on patient status, additional functional criteria and insurance authorization.  Follow Up Recommendations       Assistance Recommended at Discharge Frequent or constant Supervision/Assistance  Patient can return home with the following  Assistance with cooking/housework;A little help with bathing/dressing/bathroom;A lot of help with walking and/or transfers    Equipment Recommendations None recommended by PT  Recommendations for Other Services       Functional Status Assessment Patient has had a recent decline in their functional status and demonstrates the ability to make significant improvements in function in a reasonable and predictable amount of time.     Precautions / Restrictions Precautions Precautions: Fall;Knee Precaution Comments: no right knee  flexion Required Braces or Orthoses: Knee Immobilizer - Right Knee Immobilizer - Right: On at all times Restrictions Weight Bearing Restrictions: No RLE Weight Bearing: Weight bearing as tolerated      Mobility  Bed Mobility Overal bed mobility: Needs Assistance Bed Mobility: Supine to Sit     Supine to sit: Min guard     General bed mobility comments: increased time and effort, utilized gait belt for self assist    Transfers Overall transfer level: Needs assistance Equipment used: Rolling walker (2 wheels) Transfers: Sit to/from Stand Sit to Stand: Min guard, From elevated surface           General transfer comment: verbal cues for UE and LE positioning, increased time and effort, assist to rise and steady    Ambulation/Gait               General Gait Details: pt too fearful to attempt today  Stairs            Wheelchair Mobility    Modified Rankin (Stroke Patients Only)       Balance                                             Pertinent Vitals/Pain Pain Assessment Pain Assessment: 0-10 Pain Score: 7  Pain Location: R knee Pain Descriptors / Indicators: Sore, Aching Pain Intervention(s): Repositioned, Monitored during session, Patient requesting pain meds-RN notified    Home Living Family/patient expects to be discharged to:: Private residence Living Arrangements: Alone Available Help at Discharge: Family;Available  24 hours/day (son coming to stay with her) Type of Home: House Home Access: Stairs to enter Entrance Stairs-Rails: Left Entrance Stairs-Number of Steps: Curb then 2 steps Alternate Level Stairs-Number of Steps: has stair lift Home Layout: Two level;1/2 bath on main level Home Equipment: Advice worker (2 wheels);Cane - single point;Crutches Additional Comments: pt reports plan is for SNF, son is not able to care for her -working 12 hrs    Prior Function Prior Level of Function : Needs  assist             Mobility Comments: ambulating household distance with RW s/p R knee revision 3/20       Hand Dominance        Extremity/Trunk Assessment   Upper Extremity Assessment Upper Extremity Assessment:  (hands with deficits from RA)    Lower Extremity Assessment Lower Extremity Assessment: Generalized weakness;RLE deficits/detail RLE Deficits / Details: maintained KI       Communication   Communication: No difficulties  Cognition Arousal/Alertness: Awake/alert Behavior During Therapy: WFL for tasks assessed/performed Overall Cognitive Status: Within Functional Limits for tasks assessed                                          General Comments      Exercises     Assessment/Plan    PT Assessment Patient needs continued PT services  PT Problem List Decreased strength;Pain;Decreased range of motion;Decreased activity tolerance;Decreased balance;Decreased mobility;Decreased knowledge of precautions;Decreased safety awareness       PT Treatment Interventions DME instruction;Therapeutic exercise;Gait training;Stair training;Functional mobility training;Therapeutic activities;Patient/family education;Balance training    PT Goals (Current goals can be found in the Care Plan section)  Acute Rehab PT Goals PT Goal Formulation: With patient Time For Goal Achievement: 07/17/22 Potential to Achieve Goals: Good    Frequency 7X/week     Co-evaluation               AM-PAC PT "6 Clicks" Mobility  Outcome Measure Help needed turning from your back to your side while in a flat bed without using bedrails?: A Little Help needed moving from lying on your back to sitting on the side of a flat bed without using bedrails?: A Little Help needed moving to and from a bed to a chair (including a wheelchair)?: A Lot Help needed standing up from a chair using your arms (e.g., wheelchair or bedside chair)?: A Lot Help needed to walk in hospital  room?: Total Help needed climbing 3-5 steps with a railing? : Total 6 Click Score: 12    End of Session Equipment Utilized During Treatment: Gait belt Activity Tolerance: Patient limited by pain Patient left: in bed;with call bell/phone within reach;with bed alarm set;with SCD's reapplied Nurse Communication: Mobility status PT Visit Diagnosis: Other abnormalities of gait and mobility (R26.89)    Time: 1036-1100 PT Time Calculation (min) (ACUTE ONLY): 24 min   Charges:   PT Evaluation $PT Eval Low Complexity: 1 Low PT Treatments $Therapeutic Activity: 8-22 mins       Arlyce Dice, DPT Physical Therapist Acute Rehabilitation Services Office: [place] 07/10/2022, 1:19 PM

## 2022-07-10 NOTE — Plan of Care (Signed)
Plan of care reviewed and discussed. °

## 2022-07-10 NOTE — Plan of Care (Signed)
  Problem: Education: Goal: Knowledge of the prescribed therapeutic regimen will improve Outcome: Progressing   Problem: Activity: Goal: Ability to avoid complications of mobility impairment will improve Outcome: Progressing   Problem: Clinical Measurements: Goal: Postoperative complications will be avoided or minimized Outcome: Progressing   Problem: Pain Management: Goal: Pain level will decrease with appropriate interventions Outcome: Progressing   Problem: Skin Integrity: Goal: Will show signs of wound healing Outcome: Progressing   Problem: Education: Goal: Knowledge of General Education information will improve Description: Including pain rating scale, medication(s)/side effects and non-pharmacologic comfort measures Outcome: Progressing   Problem: Health Behavior/Discharge Planning: Goal: Ability to manage health-related needs will improve Outcome: Progressing   Problem: Clinical Measurements: Goal: Ability to maintain clinical measurements within normal limits will improve Outcome: Progressing Goal: Will remain free from infection Outcome: Progressing Goal: Diagnostic test results will improve Outcome: Progressing Goal: Respiratory complications will improve Outcome: Progressing Goal: Cardiovascular complication will be avoided Outcome: Progressing   Problem: Activity: Goal: Risk for activity intolerance will decrease Outcome: Progressing   Problem: Coping: Goal: Level of anxiety will decrease Outcome: Progressing   Problem: Elimination: Goal: Will not experience complications related to bowel motility Outcome: Progressing Goal: Will not experience complications related to urinary retention Outcome: Progressing   Problem: Pain Managment: Goal: General experience of comfort will improve Outcome: Progressing   Problem: Safety: Goal: Ability to remain free from injury will improve Outcome: Progressing   Problem: Skin Integrity: Goal: Risk for  impaired skin integrity will decrease Outcome: Progressing   

## 2022-07-10 NOTE — Progress Notes (Signed)
   Subjective: 1 Day Post-Op Procedure(s) (LRB): RIGHT OPEN REDUCTION OF KNEE DISLOCATION, POSSIBLE POLY REVSION (Right) Patient seen in rounds by Dr. Wynelle Link. Patient is well, and has had no acute complaints or problems. Denies SOB or chest pain. Denies calf pain. Patient reports pain as moderate.  Objective: Vital signs in last 24 hours: Temp:  [97.5 F (36.4 C)-98.4 F (36.9 C)] 97.9 F (36.6 C) (04/04 0507) Pulse Rate:  [62-80] 62 (04/04 0507) Resp:  [11-25] 18 (04/04 0507) BP: (101-154)/(56-85) 122/67 (04/04 0507) SpO2:  [92 %-100 %] 99 % (04/04 0507)  Intake/Output from previous day:  Intake/Output Summary (Last 24 hours) at 07/10/2022 0739 Last data filed at 07/10/2022 0508 Gross per 24 hour  Intake 3936.1 ml  Output 1940 ml  Net 1996.1 ml     Intake/Output this shift: No intake/output data recorded.  Labs: Recent Labs    07/08/22 1604 07/10/22 0336  HGB 12.1 11.3*   Recent Labs    07/08/22 1604 07/10/22 0336  WBC 12.8* 9.3  RBC 4.36 3.98  HCT 38.8 36.0  PLT 524* 466*   Recent Labs    07/08/22 1604 07/10/22 0336  NA 135 135  K 4.3 4.4  CL 103 104  CO2 24 25  BUN 12 14  CREATININE 0.72 0.72  GLUCOSE 102* 149*  CALCIUM 9.2 8.6*   No results for input(s): "LABPT", "INR" in the last 72 hours.  Exam: General - Patient is Alert and Oriented Extremity - Neurologically intact Neurovascular intact Sensation intact distally Dorsiflexion/Plantar flexion intact Dressing - dressing C/D/I Motor Function - intact, moving foot and toes well on exam.  Past Medical History:  Diagnosis Date   Complication of anesthesia    "irregular heartbeat" during neck surgery   Depression    Fibroid    Rheumatoid arthritis    Urinary incontinence     Assessment/Plan: 1 Day Post-Op Procedure(s) (LRB): RIGHT OPEN REDUCTION OF KNEE DISLOCATION, POSSIBLE POLY REVSION (Right) Principal Problem:   Failed total knee arthroplasty Active Problems:   Painful total  knee replacement, right  Estimated body mass index is 31.77 kg/m as calculated from the following:   Height as of this encounter: 5\' 2"  (1.575 m).   Weight as of this encounter: 78.8 kg. Advance diet Up with therapy D/C IV fluids  Anticipated LOS equal to or greater than 2 midnights due to - Age 71 and older with one or more of the following:  - Obesity  - Expected need for hospital services (PT, OT, Nursing) required for safe discharge  - Anticipated need for postoperative skilled nursing care or inpatient rehab  - Active co-morbidities: None OR   - Unanticipated findings during/Post Surgery: Extensor mechanism rupture  - Patient is a high risk of re-admission due to: None   DVT Prophylaxis - Xarelto Weight bearing as tolerated. No ROM to the right knee. Maintain knee immobilizer at all times.  Start physical therapy with aforementioned limitations. Patient expected to discharge to SNF. Consulted social work to assist with placement.  R. Jaynie Bream, PA-C Orthopedic Surgery 07/10/2022, 7:39 AM

## 2022-07-10 NOTE — TOC Initial Note (Signed)
Transition of Care [facility]) - Initial/Assessment Note    Patient Details  Name: [name] [name] MRN: [id number] Date of Birth: 09/17/1960  Transition of Care [facility]) CM/SW Contact:    Lennart Pall, LCSW Phone Number: 07/10/2022, 2:37 PM  Clinical Narrative:                  Met with pt to review dc planning needs. Pt admits much frustration with her situation and is concerned about returning directly home since she is alone 12 hrs per day when son at work.  Pt had first PT session today but feels that SNF rehab is the safest dc plan at this point.  Will begin SNF workup and insurance auth.   Expected Discharge Plan: Skilled Nursing Facility Barriers to Discharge: Continued Medical Work up, Ship broker, SNF Pending bed offer   Patient Goals and CMS Choice Patient states their goals for this hospitalization and ongoing recovery are:: go to rehab prior to return home CMS Medicare.gov Compare Post Acute Care list provided to:: Patient Choice offered to / list presented to : Patient [place] ownership interest in [facility].provided to:: Patient    Expected Discharge Plan and Services In-house Referral: Clinical Social Work   Post Acute Care Choice: [place] Living arrangements for the past 2 months: [address]                                      Prior Living Arrangements/Services Living arrangements for the past 2 months: Single Family Home Lives with:: Adult Children Patient language and need for interpreter reviewed:: Yes Do you feel safe going back to the place where you live?: Yes      Need for Family Participation in Patient Care: Yes (Comment) Care giver support system in place?: No (comment) Current home services: Home PT Criminal Activity/Legal Involvement Pertinent to Current Situation/Hospitalization: No - Comment as needed  Activities of Daily Living Home Assistive Devices/Equipment: Cane (specify quad or  straight), Dentures (specify type), Eyeglasses, Grab bars in shower, Grab bars around toilet, Raised toilet seat with rails ADL Screening (condition at time of admission) Patient's cognitive ability adequate to safely complete daily activities?: Yes Is the patient deaf or have difficulty hearing?: No Does the patient have difficulty seeing, even when wearing glasses/contacts?: No Does the patient have difficulty concentrating, remembering, or making decisions?: No Patient able to express need for assistance with ADLs?: Yes Does the patient have difficulty dressing or bathing?: No Independently performs ADLs?: Yes (appropriate for developmental age) Does the patient have difficulty walking or climbing stairs?: Yes Weakness of Legs: Both Weakness of Arms/Hands: Both  Permission Sought/Granted Permission sought to share information with : Family Supports, Customer service manager Permission granted to share information with : Yes, Verbal Permission Granted  Share Information with NAME: son, Sunniva Croman @ [phone]  Permission granted to share info w AGENCY: SNFs        Emotional Assessment Appearance:: Appears stated age Attitude/Demeanor/Rapport: Gracious Affect (typically observed): Accepting Orientation: : Oriented to Self, Oriented to Place, Oriented to  Time, Oriented to Situation Alcohol / Substance Use: Not Applicable Psych Involvement: No (comment)  Admission diagnosis:  Acute pain of right knee [M25.561] Painful total knee replacement, right [id number], Z96.651] Patient Active Problem List   Diagnosis Date Noted   Painful total knee replacement, right 07/08/2022   Failed total knee arthroplasty 12/04/2021  Failed total left knee replacement 12/04/2021   Status post total left knee replacement 02/28/2019   Chronic pain of left knee 02/28/2019   Status post total knee replacement, bilateral 12/01/2016   Status post bilateral foot surgery 12/01/2016   Contracture  of right elbow 06/24/2016   Contracture of left elbow 06/24/2016   Rheumatoid arthritis involving multiple sites with positive rheumatoid factor 05/27/2016   ANA positive 05/27/2016   Lateral epicondylitis, left elbow 05/27/2016   Trochanteric bursitis of left hip 05/27/2016   High risk medication use 05/27/2016   DJD (degenerative joint disease), cervical 05/27/2016   Osteopenia of neck of left femur 05/27/2016   Primary osteoarthritis of both feet 05/27/2016   S/P TAH-BSO (5/30) 09/04/2012   PCP:  Mckinley Jewel, MD Pharmacy:   [facility], [place] [address] Phone: [phone] Fax: [phone]  [id number] - [place], [place] [address] [place] [address] Phone: [phone] Fax: [phone]     Social Determinants of Health ([place]) Social History: [place]: Food Insecurity Present (07/09/2022)  Housing: Low Risk  (07/09/2022)  Transportation Needs: No Transportation Needs (07/09/2022)  Utilities: Not At Risk (07/09/2022)  Tobacco Use: Low Risk  (07/10/2022)   SDOH Interventions:     Readmission Risk Interventions     No data to display

## 2022-07-10 NOTE — NC FL2 (Signed)
[place] LEVEL OF CARE FORM     IDENTIFICATION  Patient Name: [name] [name] Birthdate: 1960-07-14 Sex: female Admission Date (Current Location): 07/08/2022  [facility] and [place] Number:  Herbalist and Address:  [facility],  [address] [place], [place]      Provider Number: [id number]  Attending Physician Name and Address:  Gaynelle Arabian, MD  Relative Name and Phone Number:  son, Helaine Burfield @ [phone]    Current Level of Care: Hospital Recommended Level of Care: [place] Prior Approval Number:    Date Approved/Denied:   PASRR Number: [id number]  Discharge Plan: SNF    Current Diagnoses: Patient Active Problem List   Diagnosis Date Noted   Painful total knee replacement, right 07/08/2022   Failed total knee arthroplasty 12/04/2021   Failed total left knee replacement 12/04/2021   Status post total left knee replacement 02/28/2019   Chronic pain of left knee 02/28/2019   Status post total knee replacement, bilateral 12/01/2016   Status post bilateral foot surgery 12/01/2016   Contracture of right elbow 06/24/2016   Contracture of left elbow 06/24/2016   Rheumatoid arthritis involving multiple sites with positive rheumatoid factor 05/27/2016   ANA positive 05/27/2016   Lateral epicondylitis, left elbow 05/27/2016   Trochanteric bursitis of left hip 05/27/2016   High risk medication use 05/27/2016   DJD (degenerative joint disease), cervical 05/27/2016   Osteopenia of neck of left femur 05/27/2016   Primary osteoarthritis of both feet 05/27/2016   S/P TAH-BSO (5/30) 09/04/2012    Orientation RESPIRATION BLADDER Height & Weight     Self, Time, Situation, Place  Normal Continent Weight: 173 lb 11.6 oz (78.8 kg) Height:  5\' 2"  (157.5 cm)  BEHAVIORAL SYMPTOMS/MOOD NEUROLOGICAL BOWEL NUTRITION STATUS      Continent Diet (regular)  AMBULATORY STATUS COMMUNICATION OF NEEDS Skin   Extensive  Assist Verbally Other (Comment) (surgical incision only)                       Personal Care Assistance Level of Assistance  Bathing, Dressing Bathing Assistance: Limited assistance   Dressing Assistance: Limited assistance     Functional Limitations Info  Sight, Hearing, Speech Sight Info: Adequate Hearing Info: Adequate Speech Info: Adequate    SPECIAL CARE FACTORS FREQUENCY  PT (By licensed PT), OT (By licensed OT)     PT Frequency: 5x/wk OT Frequency: 5x/wk            Contractures Contractures Info: Not present    Additional Factors Info  Code Status, Allergies, Psychotropic Code Status Info: Full Allergies Info: Sulfa Antibiotics, Morphine And Related, Penicillins Psychotropic Info: see MAR         Current Medications (07/10/2022):  This is the current hospital active medication list Current Facility-Administered Medications  Medication Dose Route Frequency Provider Last Rate Last Admin   0.9 %  sodium chloride infusion   Intravenous Continuous Jearld Lesch, PA   Stopped at 07/09/22 1241   0.9 %  sodium chloride infusion   Intravenous Continuous Edmisten, Kristie L, PA 75 mL/hr at 07/10/22 1222 New Bag at 07/10/22 1222   acetaminophen (TYLENOL) tablet 1,000 mg  1,000 mg Oral Q6H PRN Jearld Lesch, PA   1,000 mg at 07/10/22 1107   bisacodyl (DULCOLAX) suppository 10 mg  10 mg Rectal Daily PRN Edmisten, Kristie L, PA       citalopram (CELEXA) tablet 20 mg  20 mg Oral Daily  Jearld Lesch, PA   20 mg at 07/10/22 0942   diphenhydrAMINE (BENADRYL) 12.5 MG/5ML elixir 12.5-25 mg  12.5-25 mg Oral Q4H PRN Edmisten, Kristie L, PA       docusate sodium (COLACE) capsule 100 mg  100 mg Oral BID Edmisten, Kristie L, PA   100 mg at 07/10/22 [id number]   HYDROmorphone (DILAUDID) injection 0.5-1 mg  0.5-1 mg Intravenous Q4H PRN Jearld Lesch, PA       lip balm (CARMEX) ointment   Topical PRN Jearld Lesch, PA       menthol-cetylpyridinium (CEPACOL) lozenge 3 mg   1 lozenge Oral PRN Edmisten, Kristie L, PA   3 mg at 07/09/22 1901   Or   phenol (CHLORASEPTIC) mouth spray 1 spray  1 spray Mouth/Throat PRN Edmisten, Kristie L, PA   1 spray at 07/09/22 1855   methocarbamol (ROBAXIN) tablet 500 mg  500 mg Oral Q6H PRN Edmisten, Kristie L, PA   500 mg at 07/10/22 1106   Or   methocarbamol (ROBAXIN) 500 mg in dextrose 5 % 50 mL IVPB  500 mg Intravenous Q6H PRN Edmisten, Kristie L, PA   Stopped at 07/09/22 1817   metoCLOPramide (REGLAN) tablet 5-10 mg  5-10 mg Oral Q8H PRN Edmisten, Kristie L, PA       Or   metoCLOPramide (REGLAN) injection 5-10 mg  5-10 mg Intravenous Q8H PRN Edmisten, Kristie L, PA       ondansetron (ZOFRAN) tablet 4 mg  4 mg Oral Q6H PRN Edmisten, Kristie L, PA       Or   ondansetron (ZOFRAN) injection 4 mg  4 mg Intravenous Q6H PRN Edmisten, Kristie L, PA       oxyCODONE (Oxy IR/ROXICODONE) immediate release tablet 10-15 mg  10-15 mg Oral Q4H PRN Jearld Lesch, PA   10 mg at 07/10/22 0340   oxyCODONE (Oxy IR/ROXICODONE) immediate release tablet 5-10 mg  5-10 mg Oral Q4H PRN Jearld Lesch, PA   10 mg at 07/10/22 1430   polyethylene glycol (MIRALAX / GLYCOLAX) packet 17 g  17 g Oral Daily PRN Edmisten, Kristie L, PA       rivaroxaban (XARELTO) tablet 10 mg  10 mg Oral Q breakfast Edmisten, Kristie L, PA   10 mg at 07/10/22 0814   sodium phosphate (FLEET) 7-19 GM/118ML enema 1 enema  1 enema Rectal Once PRN Edmisten, Ok Anis, PA         Discharge Medications: Please see discharge summary for a list of discharge medications.  Relevant Imaging Results:  Relevant Lab Results:   Additional Information SS# [id number]  Lennart Pall, LCSW

## 2022-07-10 NOTE — Progress Notes (Signed)
Physical Therapy Treatment Patient Details Name: [name] [name] MRN: [id number] DOB: 1960/09/18 Today's Date: 07/10/2022   History of Present Illness Pt is 62 yo female s/p right knee polyethylene revision and patellar tendon repair on 07/09/22 and recently had R TKA revision on 06/25/22.  Pt with hx including but not limited to RA, R TKA 1996, L TKA 1989 with revision 11/2021, C spine fusion, and prior foot surgeries    PT Comments    Pt premedicated for session.  Pt able to recall no knee flexion.  Pt assisted with standing and able to ambulate short distance around room.  Pt assisted back to bed.    Recommendations for follow up therapy are one component of a multi-disciplinary discharge planning process, led by the attending physician.  Recommendations may be updated based on patient status, additional functional criteria and insurance authorization.  Follow Up Recommendations       Assistance Recommended at Discharge Frequent or constant Supervision/Assistance  Patient can return home with the following Assistance with cooking/housework;A little help with bathing/dressing/bathroom;A lot of help with walking and/or transfers   Equipment Recommendations  None recommended by PT    Recommendations for Other Services       Precautions / Restrictions Precautions Precautions: Fall;Knee Precaution Comments: no right knee flexion Required Braces or Orthoses: Knee Immobilizer - Right Knee Immobilizer - Right: On at all times Restrictions Weight Bearing Restrictions: No RLE Weight Bearing: Weight bearing as tolerated     Mobility  Bed Mobility Overal bed mobility: Needs Assistance Bed Mobility: Supine to Sit, Sit to Supine     Supine to sit: Min guard Sit to supine: Min guard   General bed mobility comments: increased time and effort, utilized gait belt for self assist    Transfers Overall transfer level: Needs assistance Equipment used: Rolling walker (2  wheels) Transfers: Sit to/from Stand Sit to Stand: Min guard, From elevated surface           General transfer comment: verbal cues for UE and LE positioning, increased time and effort    Ambulation/Gait Ambulation/Gait assistance: Min assist, Min guard Gait Distance (Feet): 10 Feet Assistive device: Rolling walker (2 wheels) Gait Pattern/deviations: Step-to pattern, Antalgic, Decreased stance time - right Gait velocity: decreased     General Gait Details: verbal cues for sequence and use of RW, distance per pt tolerance   Stairs             Wheelchair Mobility    Modified Rankin (Stroke Patients Only)       Balance                                            Cognition Arousal/Alertness: Awake/alert Behavior During Therapy: WFL for tasks assessed/performed Overall Cognitive Status: Within Functional Limits for tasks assessed                                          Exercises      General Comments        Pertinent Vitals/Pain Pain Assessment Pain Assessment: 0-10 Pain Score: 5  Pain Location: R knee Pain Descriptors / Indicators: Sore, Aching Pain Intervention(s): Monitored during session, Premedicated before session, Repositioned, Ice applied    Home Living Family/patient expects to be discharged to:: Private  residence Living Arrangements: Alone Available Help at Discharge: Family;Available 24 hours/day (son coming to stay with her) Type of Home: House Home Access: Stairs to enter Entrance Stairs-Rails: Left Entrance Stairs-Number of Steps: Curb then 2 steps Alternate Level Stairs-Number of Steps: has stair lift Home Layout: Two level;1/2 bath on main level Home Equipment: Advice worker (2 wheels);Cane - single point;Crutches Additional Comments: pt reports plan is for SNF, son is not able to care for her -working 12 hrs    Prior Function            PT Goals (current goals can now be found  in the care plan section) Acute Rehab PT Goals PT Goal Formulation: With patient Time For Goal Achievement: 07/17/22 Potential to Achieve Goals: Good Progress towards PT goals: Progressing toward goals    Frequency    7X/week      PT Plan Current plan remains appropriate    Co-evaluation              AM-PAC PT "6 Clicks" Mobility   Outcome Measure  Help needed turning from your back to your side while in a flat bed without using bedrails?: A Little Help needed moving from lying on your back to sitting on the side of a flat bed without using bedrails?: A Little Help needed moving to and from a bed to a chair (including a wheelchair)?: A Little Help needed standing up from a chair using your arms (e.g., wheelchair or bedside chair)?: A Little Help needed to walk in hospital room?: A Lot Help needed climbing 3-5 steps with a railing? : Total 6 Click Score: 15    End of Session Equipment Utilized During Treatment: Gait belt Activity Tolerance: Patient limited by pain Patient left: in bed;with call bell/phone within reach;with bed alarm set;with SCD's reapplied Nurse Communication: Mobility status PT Visit Diagnosis: Other abnormalities of gait and mobility (R26.89)     Time: [id number] PT Time Calculation (min) (ACUTE ONLY): 17 min  Charges:  $Gait Training: 8-22 mins                     Arlyce Dice, DPT Physical Therapist Acute Rehabilitation Services Office: [place] 07/10/2022, 3:33 PM

## 2022-07-11 LAB — CBC
HCT: 32.3 % — ABNORMAL LOW (ref 36.0–46.0)
Hemoglobin: 10.1 g/dL — ABNORMAL LOW (ref 12.0–15.0)
MCH: 28.1 pg (ref 26.0–34.0)
MCHC: 31.3 g/dL (ref 30.0–36.0)
MCV: 89.7 fL (ref 80.0–100.0)
Platelets: 433 10*3/uL — ABNORMAL HIGH (ref 150–400)
RBC: 3.6 MIL/uL — ABNORMAL LOW (ref 3.87–5.11)
RDW: 15.4 % (ref 11.5–15.5)
WBC: 14.3 10*3/uL — ABNORMAL HIGH (ref 4.0–10.5)
nRBC: 0 % (ref 0.0–0.2)

## 2022-07-11 MED ORDER — METHOCARBAMOL 500 MG PO TABS: 500.0000 mg | ORAL_TABLET | Freq: Four times a day (QID) | ORAL | 0 refills | Status: DC | PRN

## 2022-07-11 MED ORDER — OXYCODONE HCL 5 MG PO TABS: 5.0000 mg | ORAL_TABLET | Freq: Four times a day (QID) | ORAL | 0 refills | Status: DC | PRN

## 2022-07-11 MED ORDER — GABAPENTIN 300 MG PO CAPS
300.0000 mg | ORAL_CAPSULE | Freq: Two times a day (BID) | ORAL | Status: DC
  Administered 2022-07-11 – 2022-07-14 (×7): 300 mg via ORAL
  Filled 2022-07-11 (×7): qty 1

## 2022-07-11 MED ORDER — RIVAROXABAN 10 MG PO TABS: 10.0000 mg | ORAL_TABLET | Freq: Every day | ORAL | 0 refills | Status: AC

## 2022-07-11 NOTE — Progress Notes (Signed)
Physical Therapy Treatment Patient Details Name: [name] [name] MRN: [id number] DOB: February 08, 1961 Today's Date: 07/11/2022   History of Present Illness Pt is 62 yo female s/p right knee polyethylene revision and patellar tendon repair on 07/09/22 and recently had R TKA revision on 06/25/22.  Pt with hx including but not limited to RA, R TKA 1996, L TKA 1989 with revision 11/2021, C spine fusion, and prior foot surgeries    PT Comments    Pt limited this am by c/o pain/fatigue.  Pt requesting back to bed from chair and requiring increased time and assist to stand and pvt on L LE with RW.  Pt able to take 2 small steps only with difficulty offloading R LE with RUEs and bed being pulled up behind to allow safe transition to sitting.  Recommendations for follow up therapy are one component of a multi-disciplinary discharge planning process, led by the attending physician.  Recommendations may be updated based on patient status, additional functional criteria and insurance authorization.  Follow Up Recommendations       Assistance Recommended at Discharge Frequent or constant Supervision/Assistance  Patient can return home with the following Assistance with cooking/housework;A little help with bathing/dressing/bathroom;A lot of help with walking and/or transfers   Equipment Recommendations  None recommended by PT    Recommendations for Other Services       Precautions / Restrictions Precautions Precautions: Fall;Knee Precaution Comments: no right knee flexion Required Braces or Orthoses: Knee Immobilizer - Right Knee Immobilizer - Right: On at all times Restrictions Weight Bearing Restrictions: No RLE Weight Bearing: Weight bearing as tolerated     Mobility  Bed Mobility Overal bed mobility: Needs Assistance Bed Mobility: Sit to Supine     Supine to sit: Min guard Sit to supine: Min assist   General bed mobility comments: assist to manage R LE onto bed     Transfers Overall transfer level: Needs assistance Equipment used: Rolling walker (2 wheels) Transfers: Sit to/from Stand, Bed to chair/wheelchair/BSC Sit to Stand: Min assist Stand pivot transfers: Min assist, +2 physical assistance, +2 safety/equipment         General transfer comment: verbal cues for UE and LE positioning, increased time and effort    Ambulation/Gait Ambulation/Gait assistance: Min assist, +2 physical assistance, +2 safety/equipment Gait Distance (Feet): 2 Feet Assistive device: Rolling walker (2 wheels) Gait Pattern/deviations: Step-to pattern, Antalgic, Decreased stance time - right Gait velocity: decreased     General Gait Details: verbal cues for sequence, posture, position from RW and increased WB on UEs; pt ltd by c/o pain/fatigue and bed pulled in behind for safe move to sitting   Stairs             Wheelchair Mobility    Modified Rankin (Stroke Patients Only)       Balance Overall balance assessment: Needs assistance Sitting-balance support: No upper extremity supported, Feet supported Sitting balance-Leahy Scale: Good     Standing balance support: Bilateral upper extremity supported, Reliant on assistive device for balance Standing balance-Leahy Scale: Poor Standing balance comment: RW and min guard/A                            Cognition Arousal/Alertness: Awake/alert Behavior During Therapy: WFL for tasks assessed/performed Overall Cognitive Status: Within Functional Limits for tasks assessed  Exercises General Exercises - Lower Extremity Ankle Circles/Pumps: AROM, Both, 15 reps, Supine    General Comments        Pertinent Vitals/Pain Pain Assessment Pain Assessment: 0-10 Pain Score: 7  Pain Location: R knee Pain Descriptors / Indicators: Sore, Aching Pain Intervention(s): Limited activity within patient's tolerance, Monitored during session,  Premedicated before session    Home Living                          Prior Function            PT Goals (current goals can now be found in the care plan section) Acute Rehab PT Goals Patient Stated Goal: Regain IND PT Goal Formulation: With patient Time For Goal Achievement: 07/17/22 Potential to Achieve Goals: Good Progress towards PT goals: Not progressing toward goals - comment (ltd by c/o pain and fatigue)    Frequency    7X/week      PT Plan Current plan remains appropriate    Co-evaluation              AM-PAC PT "6 Clicks" Mobility   Outcome Measure  Help needed turning from your back to your side while in a flat bed without using bedrails?: A Little Help needed moving from lying on your back to sitting on the side of a flat bed without using bedrails?: A Little Help needed moving to and from a bed to a chair (including a wheelchair)?: A Lot Help needed standing up from a chair using your arms (e.g., wheelchair or bedside chair)?: A Lot Help needed to walk in hospital room?: Total Help needed climbing 3-5 steps with a railing? : Total 6 Click Score: 12    End of Session Equipment Utilized During Treatment: Gait belt Activity Tolerance: Patient limited by fatigue;Patient limited by pain Patient left: with call bell/phone within reach;in bed;with bed alarm set;with nursing/sitter in room Nurse Communication: Mobility status PT Visit Diagnosis: Other abnormalities of gait and mobility (R26.89)     Time: 6948-5462 PT Time Calculation (min) (ACUTE ONLY): 14 min  Charges:  $Gait Training: 8-22 mins $Therapeutic Activity: 8-22 mins                     [name] PT Acute Rehabilitation Services Pager [phone] Office [phone]    Alyda Megna 07/11/2022, 2:47 PM

## 2022-07-11 NOTE — Progress Notes (Signed)
Physical Therapy Treatment Patient Details Name: [name] [name] MRN: [id number] DOB: [birth year] Today's Date: 07/11/2022   History of Present Illness Pt is 62 yo female s/p right knee polyethylene revision and patellar tendon repair on 07/09/22 and recently had R TKA revision on 06/25/22.  Pt with hx including but not limited to RA, R TKA 1996, L TKA 1989 with revision 11/2021, C spine fusion, and prior foot surgeries    PT Comments    Pt very cooperative and progressing with mobility but continues to fatigue easily and pain limited.   Recommendations for follow up therapy are one component of a multi-disciplinary discharge planning process, led by the attending physician.  Recommendations may be updated based on patient status, additional functional criteria and insurance authorization.  Follow Up Recommendations       Assistance Recommended at Discharge Frequent or constant Supervision/Assistance  Patient can return home with the following Assistance with cooking/housework;A little help with bathing/dressing/bathroom;A lot of help with walking and/or transfers   Equipment Recommendations  None recommended by PT    Recommendations for Other Services       Precautions / Restrictions Precautions Precautions: Fall;Knee Precaution Comments: no right knee flexion Required Braces or Orthoses: Knee Immobilizer - Right Knee Immobilizer - Right: On at all times Restrictions Weight Bearing Restrictions: No RLE Weight Bearing: Weight bearing as tolerated     Mobility  Bed Mobility Overal bed mobility: Needs Assistance Bed Mobility: Supine to Sit     Supine to sit: Min guard     General bed mobility comments: increased time and effort, utilized gait belt for self assist    Transfers Overall transfer level: Needs assistance Equipment used: Rolling walker (2 wheels) Transfers: Sit to/from Stand Sit to Stand: Min guard, From elevated surface           General transfer  comment: verbal cues for UE and LE positioning, increased time and effort    Ambulation/Gait Ambulation/Gait assistance: Min assist, Min guard Gait Distance (Feet): 28 Feet Assistive device: Rolling walker (2 wheels) Gait Pattern/deviations: Step-to pattern, Antalgic, Decreased stance time - right Gait velocity: decreased     General Gait Details: verbal cues for sequence, posture, position from RW and increased WB on UEs; distance per pt tolerance   Stairs             Wheelchair Mobility    Modified Rankin (Stroke Patients Only)       Balance Overall balance assessment: Needs assistance Sitting-balance support: No upper extremity supported, Feet supported Sitting balance-Leahy Scale: Good     Standing balance support: Bilateral upper extremity supported, Reliant on assistive device for balance Standing balance-Leahy Scale: Poor                              Cognition Arousal/Alertness: Awake/alert Behavior During Therapy: WFL for tasks assessed/performed Overall Cognitive Status: Within Functional Limits for tasks assessed                                          Exercises General Exercises - Lower Extremity Ankle Circles/Pumps: AROM, Both, 15 reps, Supine    General Comments        Pertinent Vitals/Pain Pain Assessment Pain Assessment: 0-10 Pain Score: 5  Pain Location: R knee Pain Descriptors / Indicators: Sore, Aching Pain Intervention(s): Limited activity within patient's tolerance,  Monitored during session, Premedicated before session, Ice applied    Home Living                          Prior Function            PT Goals (current goals can now be found in the care plan section) Acute Rehab PT Goals Patient Stated Goal: Regain IND PT Goal Formulation: With patient Time For Goal Achievement: 07/17/22 Potential to Achieve Goals: Good Progress towards PT goals: Progressing toward goals     Frequency    7X/week      PT Plan Current plan remains appropriate    Co-evaluation              AM-PAC PT "6 Clicks" Mobility   Outcome Measure  Help needed turning from your back to your side while in a flat bed without using bedrails?: A Little Help needed moving from lying on your back to sitting on the side of a flat bed without using bedrails?: A Little Help needed moving to and from a bed to a chair (including a wheelchair)?: A Little Help needed standing up from a chair using your arms (e.g., wheelchair or bedside chair)?: A Little Help needed to walk in hospital room?: A Lot Help needed climbing 3-5 steps with a railing? : Total 6 Click Score: 15    End of Session Equipment Utilized During Treatment: Gait belt Activity Tolerance: Patient limited by fatigue;Patient limited by pain Patient left: in chair;with call bell/phone within reach;with chair alarm set Nurse Communication: Mobility status PT Visit Diagnosis: Other abnormalities of gait and mobility (R26.89)     Time: 1010-1035 PT Time Calculation (min) (ACUTE ONLY): 25 min  Charges:  $Gait Training: 8-22 mins $Therapeutic Activity: 8-22 mins                     Mauro KaufmannHunter Obbie Lewallen PT Acute Rehabilitation Services Pager [phone] Office [phone]    [name] 07/11/2022, 12:29 PM

## 2022-07-11 NOTE — Progress Notes (Signed)
   Subjective: 2 Days Post-Op Procedure(s) (LRB): RIGHT OPEN REDUCTION OF KNEE DISLOCATION, POSSIBLE POLY REVSION (Right) Patient seen in rounds for Dr. Lequita Halt. Patient is well, and has had no acute complaints or problems. Denies SOB or chest pain. Denies calf pain. Patient reports pain as moderate.  Improving. Worked with physical therapy yesterday but hesitant to ambulate.  Objective: Vital signs in last 24 hours: Temp:  [98.1 F (36.7 C)-98.8 F (37.1 C)] 98.1 F (36.7 C) (04/05 0512) Pulse Rate:  [67-76] 76 (04/05 0512) Resp:  [16-18] 18 (04/05 0512) BP: (124-135)/(56-67) 135/67 (04/05 0512) SpO2:  [99 %-100 %] 100 % (04/05 0512)  Intake/Output from previous day:  Intake/Output Summary (Last 24 hours) at 07/11/2022 0757 Last data filed at 07/11/2022 0514 Gross per 24 hour  Intake 913.56 ml  Output 1150 ml  Net -236.44 ml    Intake/Output this shift: No intake/output data recorded.  Labs: Recent Labs    07/08/22 1604 07/10/22 0336 07/11/22 0313  HGB 12.1 11.3* 10.1*   Recent Labs    07/10/22 0336 07/11/22 0313  WBC 9.3 14.3*  RBC 3.98 3.60*  HCT 36.0 32.3*  PLT 466* 433*   Recent Labs    07/08/22 1604 07/10/22 0336  NA 135 135  K 4.3 4.4  CL 103 104  CO2 24 25  BUN 12 14  CREATININE 0.72 0.72  GLUCOSE 102* 149*  CALCIUM 9.2 8.6*   No results for input(s): "LABPT", "INR" in the last 72 hours.  Exam: General - Patient is Alert and Oriented Extremity - Neurologically intact Neurovascular intact Sensation intact distally Dorsiflexion/Plantar flexion intact Dressing/Incision - clean, dry, no drainage Motor Function - intact, moving foot and toes well on exam.  Past Medical History:  Diagnosis Date   Complication of anesthesia    "irregular heartbeat" during neck surgery   Depression    Fibroid    Rheumatoid arthritis    Urinary incontinence     Assessment/Plan: 2 Days Post-Op Procedure(s) (LRB): RIGHT OPEN REDUCTION OF KNEE DISLOCATION,  POSSIBLE POLY REVSION (Right) Principal Problem:   Failed total knee arthroplasty Active Problems:   Painful total knee replacement, right  Estimated body mass index is 31.77 kg/m as calculated from the following:   Height as of this encounter: 5\' 2"  (1.575 m).   Weight as of this encounter: 78.8 kg.  DVT Prophylaxis - Xarelto Weight-bearing as tolerated. No ROM to the right knee. Maintain knee immobilizer at all times.   Plan to go to SNF after hospital stay. Social work currently working on finding placement for patient. Scripts printed and in chart for hopeful discharge this weekend. Continue physical therapy until that time. Encouraged to work on ambulation with them today. Follow-up in clinic in 2 weeks.  The PDMP database was reviewed today prior to any opioid medications being prescribed to this patient.  R. Arcola Jansky, PA-C 07/11/2022, 7:57 AM

## 2022-07-12 NOTE — Plan of Care (Signed)
  Problem: Education: Goal: Knowledge of General Education information will improve Description Including pain rating scale, medication(s)/side effects and non-pharmacologic comfort measures Outcome: Progressing   

## 2022-07-12 NOTE — Progress Notes (Signed)
[name] [name]  MRN: [id number] DOB/Age: 60-Jan-1962 60 y.o. [facility] Procedure: Procedure(s) (LRB): RIGHT OPEN REDUCTION OF KNEE DISLOCATION, POSSIBLE POLY REVSION (Right)     Subjective: Alert in bed. No specific complaints  Vital Signs Temp:  [98.6 F (37 C)-99 F (37.2 C)] 99 F (37.2 C) (04/06 0450) Pulse Rate:  [67-79] 79 (04/06 0450) Resp:  [14-18] 18 (04/06 0450) BP: (105-145)/(54-75) 145/75 (04/06 0450) SpO2:  [95 %-99 %] 99 % (04/06 0450)  Lab Results Recent Labs    07/10/22 0336 07/11/22 0313  WBC 9.3 14.3*  HGB 11.3* 10.1*  HCT 36.0 32.3*  PLT 466* 433*   BMET Recent Labs    07/10/22 0336  NA 135  K 4.4  CL 104  CO2 25  GLUCOSE 149*  BUN 14  CREATININE 0.72  CALCIUM 8.6*   No results found for: "INR"   Exam Right knee aqaucel with old bleeding to superior incision, well within the dressings capacity so left in place NVI        Plan Plan is to keep through weekend with goal to DC to [facility] Monday, bed search in progress Continue therapies and attempted mobilization through weekend   [employer] PA-C  07/12/2022, 10:12 AM Contact # [phone]

## 2022-07-12 NOTE — Plan of Care (Signed)
  Problem: Activity: Goal: Ability to avoid complications of mobility impairment will improve Outcome: Progressing   Problem: Clinical Measurements: Goal: Postoperative complications will be avoided or minimized Outcome: Progressing   Problem: Pain Management: Goal: Pain level will decrease with appropriate interventions Outcome: Progressing   

## 2022-07-12 NOTE — Progress Notes (Signed)
Patient had copious amount of sanguineous drainage on Aquacel bandage. New Aquacel dressing applied. Made Ralene Bathe, PA aware.

## 2022-07-12 NOTE — Progress Notes (Signed)
Physical Therapy Treatment Patient Details Name: [name] [name] MRN: [id number] DOB: Mar 17, 1961 Today's Date: 07/12/2022   History of Present Illness Pt is 62 yo female s/p right knee polyethylene revision and patellar tendon repair on 07/09/22 and recently had R TKA revision on 06/25/22.  Pt with hx including but not limited to RA, R TKA 1996, L TKA 1989 with revision 11/2021, C spine fusion, and prior foot surgeries    PT Comments    Pt continues cooperative and progressing steadily with mobility,  Pt continues to struggle with sit to stand but noted improvement in bed mobility and WB tolerance on R LE with ambulation.   Recommendations for follow up therapy are one component of a multi-disciplinary discharge planning process, led by the attending physician.  Recommendations may be updated based on patient status, additional functional criteria and insurance authorization.  Follow Up Recommendations       Assistance Recommended at Discharge Frequent or constant Supervision/Assistance  Patient can return home with the following Assistance with cooking/housework;A little help with bathing/dressing/bathroom;A lot of help with walking and/or transfers   Equipment Recommendations  None recommended by PT    Recommendations for Other Services       Precautions / Restrictions Precautions Precautions: Fall;Knee Precaution Comments: no right knee flexion Required Braces or Orthoses: Knee Immobilizer - Right Knee Immobilizer - Right: On at all times Restrictions Weight Bearing Restrictions: No RLE Weight Bearing: Weight bearing as tolerated     Mobility  Bed Mobility Overal bed mobility: Needs Assistance Bed Mobility: Sit to Supine, Supine to Sit     Supine to sit: Min guard Sit to supine: Min assist   General bed mobility comments: increased time and use of gait belt    Transfers Overall transfer level: Needs assistance Equipment used: Rolling walker (2  wheels) Transfers: Sit to/from Stand Sit to Stand: Min assist, Mod assist   Step pivot transfers: Min assist, Mod assist, From elevated surface       General transfer comment: verbal cues for UE and LE positioning, assist to bring wt up and fwd.  Step pvt bed to [facility] and recliner to bedside    Ambulation/Gait Ambulation/Gait assistance: Min assist, +2 safety/equipment Gait Distance (Feet): 51 Feet Assistive device: Rolling walker (2 wheels) Gait Pattern/deviations: Step-to pattern, Antalgic, Decreased stance time - right Gait velocity: decreased     General Gait Details: verbal cues for sequence, posture, position from RW and increased WB on UEs; pt ltd by c/o pain/fatigue   Stairs             Wheelchair Mobility    Modified Rankin (Stroke Patients Only)       Balance Overall balance assessment: Needs assistance Sitting-balance support: No upper extremity supported, Feet supported Sitting balance-Leahy Scale: Good     Standing balance support: Reliant on assistive device for balance, Single extremity supported Standing balance-Leahy Scale: Poor Standing balance comment: RW and min guard/A                            Cognition Arousal/Alertness: Awake/alert Behavior During Therapy: WFL for tasks assessed/performed Overall Cognitive Status: Within Functional Limits for tasks assessed                                          Exercises General Exercises - Lower Extremity Ankle Circles/Pumps: AROM,  Both, 15 reps, Supine    General Comments        Pertinent Vitals/Pain Pain Assessment Pain Assessment: 0-10 Pain Score: 5  Pain Location: R knee Pain Descriptors / Indicators: Sore, Aching Pain Intervention(s): Limited activity within patient's tolerance    Home Living                          Prior Function            PT Goals (current goals can now be found in the care plan section) Acute Rehab PT  Goals Patient Stated Goal: Regain IND PT Goal Formulation: With patient Time For Goal Achievement: 07/17/22 Potential to Achieve Goals: Good Progress towards PT goals: Progressing toward goals    Frequency    7X/week      PT Plan Current plan remains appropriate    Co-evaluation              AM-PAC PT "6 Clicks" Mobility   Outcome Measure  Help needed turning from your back to your side while in a flat bed without using bedrails?: A Little Help needed moving from lying on your back to sitting on the side of a flat bed without using bedrails?: A Little Help needed moving to and from a bed to a chair (including a wheelchair)?: A Little Help needed standing up from a chair using your arms (e.g., wheelchair or bedside chair)?: A Little Help needed to walk in hospital room?: A Lot Help needed climbing 3-5 steps with a railing? : Total 6 Click Score: 15    End of Session Equipment Utilized During Treatment: Gait belt Activity Tolerance: Patient tolerated treatment well Patient left: in bed;with call bell/phone within reach;with bed alarm set Nurse Communication: Mobility status PT Visit Diagnosis: Other abnormalities of gait and mobility (R26.89)     Time: [id number] PT Time Calculation (min) (ACUTE ONLY): 27 min  Charges:  $Gait Training: 8-22 mins $Therapeutic Activity: 8-22 mins                     Mauro KaufmannHunter Adian Jablonowski PT Acute Rehabilitation Services Pager [phone] Office [phone]    [name] [name] 07/12/2022, 4:42 PM

## 2022-07-12 NOTE — Progress Notes (Signed)
Physical Therapy Treatment Patient Details Name: [name] [name] MRN: [id number] DOB: 09-Aug-1960 Today's Date: 07/12/2022   History of Present Illness Pt is 62 yo female s/p right knee polyethylene revision and patellar tendon repair on 07/09/22 and recently had R TKA revision on 06/25/22.  Pt with hx including but not limited to RA, R TKA 1996, L TKA 1989 with revision 11/2021, C spine fusion, and prior foot surgeries    PT Comments    Pt very cooperative and up to ambulate increased distance in hall demonstrating increased stability and increased WB tolerance on R LE.   Recommendations for follow up therapy are one component of a multi-disciplinary discharge planning process, led by the attending physician.  Recommendations may be updated based on patient status, additional functional criteria and insurance authorization.  Follow Up Recommendations       Assistance Recommended at Discharge Frequent or constant Supervision/Assistance  Patient can return home with the following Assistance with cooking/housework;A little help with bathing/dressing/bathroom;A lot of help with walking and/or transfers   Equipment Recommendations  None recommended by PT    Recommendations for Other Services       Precautions / Restrictions Precautions Precautions: Fall;Knee Precaution Comments: no right knee flexion Required Braces or Orthoses: Knee Immobilizer - Right Knee Immobilizer - Right: On at all times Restrictions Weight Bearing Restrictions: No RLE Weight Bearing: Weight bearing as tolerated     Mobility  Bed Mobility               General bed mobility comments: Pt up in chair and requests back to same    Transfers Overall transfer level: Needs assistance Equipment used: Rolling walker (2 wheels) Transfers: Sit to/from Stand Sit to Stand: Min assist, Mod assist           General transfer comment: verbal cues for UE and LE positioning, increased time and effort     Ambulation/Gait Ambulation/Gait assistance: Min assist, +2 safety/equipment Gait Distance (Feet): 38 Feet Assistive device: Rolling walker (2 wheels) Gait Pattern/deviations: Step-to pattern, Antalgic, Decreased stance time - right Gait velocity: decreased     General Gait Details: verbal cues for sequence, posture, position from RW and increased WB on UEs; pt ltd by c/o pain/fatigue   Stairs             Wheelchair Mobility    Modified Rankin (Stroke Patients Only)       Balance Overall balance assessment: Needs assistance Sitting-balance support: No upper extremity supported, Feet supported Sitting balance-Leahy Scale: Good     Standing balance support: Bilateral upper extremity supported, Reliant on assistive device for balance Standing balance-Leahy Scale: Poor Standing balance comment: RW and min guard/A                            Cognition Arousal/Alertness: Awake/alert Behavior During Therapy: WFL for tasks assessed/performed Overall Cognitive Status: Within Functional Limits for tasks assessed                                          Exercises General Exercises - Lower Extremity Ankle Circles/Pumps: AROM, Both, 15 reps, Supine    General Comments        Pertinent Vitals/Pain Pain Assessment Pain Assessment: 0-10 Pain Score: 5  Pain Location: R knee Pain Descriptors / Indicators: Sore, Aching Pain Intervention(s): Limited activity within patient's  tolerance, Monitored during session, Premedicated before session, Ice applied    Home Living                          Prior Function            PT Goals (current goals can now be found in the care plan section) Acute Rehab PT Goals Patient Stated Goal: Regain IND PT Goal Formulation: With patient Time For Goal Achievement: 07/17/22 Potential to Achieve Goals: Good Progress towards PT goals: Progressing toward goals    Frequency    7X/week       PT Plan Current plan remains appropriate    Co-evaluation              AM-PAC PT "6 Clicks" Mobility   Outcome Measure  Help needed turning from your back to your side while in a flat bed without using bedrails?: A Little Help needed moving from lying on your back to sitting on the side of a flat bed without using bedrails?: A Little Help needed moving to and from a bed to a chair (including a wheelchair)?: A Little Help needed standing up from a chair using your arms (e.g., wheelchair or bedside chair)?: A Little Help needed to walk in hospital room?: A Lot Help needed climbing 3-5 steps with a railing? : Total 6 Click Score: 15    End of Session Equipment Utilized During Treatment: Gait belt Activity Tolerance: Patient tolerated treatment well Patient left: in chair;with call bell/phone within reach;with chair alarm set Nurse Communication: Mobility status PT Visit Diagnosis: Other abnormalities of gait and mobility (R26.89)     Time: 1517-6160 PT Time Calculation (min) (ACUTE ONLY): 20 min  Charges:  $Gait Training: 8-22 mins                     [name] [name] PT Acute Rehabilitation Services Pager [phone] Office [phone]    [name] [name] 07/12/2022, 12:30 PM

## 2022-07-13 NOTE — Progress Notes (Signed)
Physical Therapy Treatment Patient Details Name: [name] [name] MRN: [id number] DOB: 12-Feb-1961 Today's Date: 07/13/2022   History of Present Illness Pt is 62 yo female s/p right knee polyethylene revision and patellar tendon repair on 07/09/22 and recently had R TKA revision on 06/25/22.  Pt with hx including but not limited to RA, R TKA 1996, L TKA 1989 with revision 11/2021, C spine fusion, and prior foot surgeries    PT Comments    Pt continues cooperative but limited this pm by increased fatigue and pain with mobility.  Pt up to ambulate 46' in hall with RW, sit<>stand x 4 but still requiring significant assist, and assisted to bed. Pt with multiple questions asked and answered.  Recommendations for follow up therapy are one component of a multi-disciplinary discharge planning process, led by the attending physician.  Recommendations may be updated based on patient status, additional functional criteria and insurance authorization.  Follow Up Recommendations       Assistance Recommended at Discharge Frequent or constant Supervision/Assistance  Patient can return home with the following Assistance with cooking/housework;A little help with bathing/dressing/bathroom;A lot of help with walking and/or transfers   Equipment Recommendations  None recommended by PT    Recommendations for Other Services       Precautions / Restrictions Precautions Precautions: Fall;Knee Precaution Comments: no right knee flexion Required Braces or Orthoses: Knee Immobilizer - Right Knee Immobilizer - Right: On at all times Restrictions Weight Bearing Restrictions: No RLE Weight Bearing: Weight bearing as tolerated     Mobility  Bed Mobility Overal bed mobility: Needs Assistance Bed Mobility: Sit to Supine       Sit to supine: Min assist   General bed mobility comments: very min assist for R LE only    Transfers Overall transfer level: Needs assistance Equipment used: Rolling walker  (2 wheels) Transfers: Sit to/from Stand Sit to Stand: Min assist, Mod assist   Step pivot transfers: Min assist, Mod assist       General transfer comment: verbal cues for UE and LE positioning, assist to bring wt up and fwd.  Step pvt bed to [facility] and recliner to bedside    Ambulation/Gait Ambulation/Gait assistance: Min assist Gait Distance (Feet): 46 Feet Assistive device: Rolling walker (2 wheels) Gait Pattern/deviations: Step-to pattern, Antalgic, Decreased stance time - right Gait velocity: decreased     General Gait Details: verbal cues for sequence, posture, position from RW;distance ltd by fatigue/pain   Stairs             Wheelchair Mobility    Modified Rankin (Stroke Patients Only)       Balance Overall balance assessment: Needs assistance Sitting-balance support: No upper extremity supported, Feet supported Sitting balance-Leahy Scale: Good     Standing balance support: No upper extremity supported Standing balance-Leahy Scale: Fair                              Cognition Arousal/Alertness: Awake/alert Behavior During Therapy: WFL for tasks assessed/performed Overall Cognitive Status: Within Functional Limits for tasks assessed                                          Exercises      General Comments        Pertinent Vitals/Pain Pain Assessment Pain Assessment: 0-10 Pain Score: 5  Pain Location: R knee Pain Descriptors / Indicators: Sore, Aching Pain Intervention(s): Limited activity within patient's tolerance, Monitored during session, Premedicated before session, Ice applied    Home Living                          Prior Function            PT Goals (current goals can now be found in the care plan section) Acute Rehab PT Goals Patient Stated Goal: Regain IND PT Goal Formulation: With patient Time For Goal Achievement: 07/17/22 Potential to Achieve Goals: Good Progress towards PT goals:  Progressing toward goals    Frequency    7X/week      PT Plan Current plan remains appropriate    Co-evaluation              AM-PAC PT "6 Clicks" Mobility   Outcome Measure  Help needed turning from your back to your side while in a flat bed without using bedrails?: A Little Help needed moving from lying on your back to sitting on the side of a flat bed without using bedrails?: A Little Help needed moving to and from a bed to a chair (including a wheelchair)?: A Little Help needed standing up from a chair using your arms (e.g., wheelchair or bedside chair)?: A Little Help needed to walk in hospital room?: A Little Help needed climbing 3-5 steps with a railing? : Total 6 Click Score: 16    End of Session Equipment Utilized During Treatment: Gait belt Activity Tolerance: Patient tolerated treatment well Patient left: in bed;with call bell/phone within reach;with bed alarm set Nurse Communication: Mobility status PT Visit Diagnosis: Other abnormalities of gait and mobility (R26.89)     Time: [id number] PT Time Calculation (min) (ACUTE ONLY): 27 min  Charges:  $Gait Training: 8-22 mins $Therapeutic Activity: 8-22 mins                     Mauro KaufmannHunter Laylah Riga PT Acute Rehabilitation Services Pager [phone] Office [phone]    Braedin Millhouse 07/13/2022, 4:02 PM

## 2022-07-13 NOTE — Plan of Care (Signed)
  Problem: Activity: Goal: Ability to avoid complications of mobility impairment will improve Outcome: Progressing   Problem: Health Behavior/Discharge Planning: Goal: Ability to manage health-related needs will improve Outcome: Progressing   Problem: Activity: Goal: Risk for activity intolerance will decrease Outcome: Progressing

## 2022-07-13 NOTE — Progress Notes (Signed)
   Subjective: 4 Days Post-Op Procedure(s) (LRB): RIGHT OPEN REDUCTION OF KNEE DISLOCATION, POSSIBLE POLY REVSION (Right) Patient reports pain as mild.   Patient seen in rounds for Dr. Lequita Halt. Patient is well, and has had no acute complaints or problems. No acute events overnight. Awaiting SNF placement.  We will continue therapy today.   Objective: Vital signs in last 24 hours: Temp:  [97.8 F (36.6 C)-99 F (37.2 C)] 99 F (37.2 C) (04/07 0521) Pulse Rate:  [70-90] 90 (04/07 0521) Resp:  [15-18] 15 (04/07 0521) BP: (103-132)/(56-80) 132/80 (04/07 0521) SpO2:  [95 %-99 %] 99 % (04/07 0521)  Intake/Output from previous day:  Intake/Output Summary (Last 24 hours) at 07/13/2022 0805 Last data filed at 07/12/2022 1838 Gross per 24 hour  Intake 1020 ml  Output 300 ml  Net 720 ml     Intake/Output this shift: No intake/output data recorded.  Labs: Recent Labs    07/11/22 0313  HGB 10.1*   Recent Labs    07/11/22 0313  WBC 14.3*  RBC 3.60*  HCT 32.3*  PLT 433*   No results for input(s): "NA", "K", "CL", "CO2", "BUN", "CREATININE", "GLUCOSE", "CALCIUM" in the last 72 hours. No results for input(s): "LABPT", "INR" in the last 72 hours.  Exam: General - Patient is Alert and Oriented Extremity - Neurologically intact Sensation intact distally Intact pulses distally Dorsiflexion/Plantar flexion intact Knee immobilizer in place Dressing - Small amount of bloody drainage at the proximal dressing Motor Function - intact, moving foot and toes well on exam.   Past Medical History:  Diagnosis Date   Complication of anesthesia    "irregular heartbeat" during neck surgery   Depression    Fibroid    Rheumatoid arthritis    Urinary incontinence     Assessment/Plan: 4 Days Post-Op Procedure(s) (LRB): RIGHT OPEN REDUCTION OF KNEE DISLOCATION, POSSIBLE POLY REVSION (Right) Principal Problem:   Failed total knee arthroplasty Active Problems:   Painful total knee  replacement, right  Estimated body mass index is 31.77 kg/m as calculated from the following:   Height as of this encounter: 5\' 2"  (1.575 m).   Weight as of this encounter: 78.8 kg.   DVT Prophylaxis - Xarelto Weight-bearing as tolerated. No ROM to the right knee. Maintain knee immobilizer at all times.    Plan to go to SNF after hospital stay. Social work currently working on finding placement for patient. Scripts printed.  Continue physical therapy until that time. Encouraged to work on ambulation with them today. Follow-up in clinic in 2 weeks.      Dennie Bible, PA-C Orthopedic Surgery [phone] 07/13/2022, 8:05 AM

## 2022-07-13 NOTE — Progress Notes (Signed)
Physical Therapy Treatment Patient Details Name: [name] [name] MRN: [id number] DOB: 1960/08/15 Today's Date: 07/13/2022   History of Present Illness Pt is 62 yo female s/p right knee polyethylene revision and patellar tendon repair on 07/09/22 and recently had R TKA revision on 06/25/22.  Pt with hx including but not limited to RA, R TKA 1996, L TKA 1989 with revision 11/2021, C spine fusion, and prior foot surgeries    PT Comments    Pt continues cooperative and progressing steadily with mobility, Pt continues to struggle with sit to stand but noted improvement in WB tolerance on R LE with ambulation and distance ambulated.  Recommendations for follow up therapy are one component of a multi-disciplinary discharge planning process, led by the attending physician.  Recommendations may be updated based on patient status, additional functional criteria and insurance authorization.  Follow Up Recommendations       Assistance Recommended at Discharge Frequent or constant Supervision/Assistance  Patient can return home with the following Assistance with cooking/housework;A little help with bathing/dressing/bathroom;A lot of help with walking and/or transfers   Equipment Recommendations  None recommended by PT    Recommendations for Other Services       Precautions / Restrictions Precautions Precautions: Fall;Knee Precaution Comments: no right knee flexion Required Braces or Orthoses: Knee Immobilizer - Right Knee Immobilizer - Right: On at all times Restrictions Weight Bearing Restrictions: No RLE Weight Bearing: Weight bearing as tolerated     Mobility  Bed Mobility               General bed mobility comments: Pt up in chair and requests back to same    Transfers Overall transfer level: Needs assistance Equipment used: Rolling walker (2 wheels) Transfers: Sit to/from Stand Sit to Stand: Min assist, Mod assist           General transfer comment: verbal cues  for UE and LE positioning, assist to bring wt up and fwd.  Step pvt bed to [facility] and recliner to bedside    Ambulation/Gait Ambulation/Gait assistance: Min assist, +2 safety/equipment (chair follow) Gait Distance (Feet): 72 Feet Assistive device: Rolling walker (2 wheels) Gait Pattern/deviations: Step-to pattern, Antalgic, Decreased stance time - right Gait velocity: decreased     General Gait Details: verbal cues for sequence, posture, position from RW;distance ltd by fatigue   Stairs             Wheelchair Mobility    Modified Rankin (Stroke Patients Only)       Balance Overall balance assessment: Needs assistance Sitting-balance support: No upper extremity supported, Feet supported Sitting balance-Leahy Scale: Good     Standing balance support: Reliant on assistive device for balance, Single extremity supported Standing balance-Leahy Scale: Poor                              Cognition Arousal/Alertness: Awake/alert Behavior During Therapy: WFL for tasks assessed/performed Overall Cognitive Status: Within Functional Limits for tasks assessed                                          Exercises      General Comments        Pertinent Vitals/Pain Pain Assessment Pain Assessment: 0-10 Pain Score: 4  Pain Location: R knee Pain Descriptors / Indicators: Sore, Aching Pain Intervention(s): Limited activity within patient's  tolerance, Monitored during session, Premedicated before session, Ice applied    Home Living                          Prior Function            PT Goals (current goals can now be found in the care plan section) Acute Rehab PT Goals Patient Stated Goal: Regain IND PT Goal Formulation: With patient Time For Goal Achievement: 07/17/22 Potential to Achieve Goals: Good Progress towards PT goals: Progressing toward goals    Frequency    7X/week      PT Plan Current plan remains appropriate     Co-evaluation              AM-PAC PT "6 Clicks" Mobility   Outcome Measure  Help needed turning from your back to your side while in a flat bed without using bedrails?: A Little Help needed moving from lying on your back to sitting on the side of a flat bed without using bedrails?: A Little Help needed moving to and from a bed to a chair (including a wheelchair)?: A Little Help needed standing up from a chair using your arms (e.g., wheelchair or bedside chair)?: A Little Help needed to walk in hospital room?: A Little Help needed climbing 3-5 steps with a railing? : Total 6 Click Score: 16    End of Session Equipment Utilized During Treatment: Gait belt Activity Tolerance: Patient tolerated treatment well Patient left: in chair;with call bell/phone within reach;with chair alarm set Nurse Communication: Mobility status PT Visit Diagnosis: Other abnormalities of gait and mobility (R26.89)     Time: 7867-5449 PT Time Calculation (min) (ACUTE ONLY): 16 min  Charges:  $Gait Training: 8-22 mins                     [name] PT Acute Rehabilitation Services Pager [phone] Office [phone]    Keyonte Cookston 07/13/2022, 12:17 PM

## 2022-07-14 NOTE — Discharge Summary (Signed)
Physician Discharge Summary   Patient ID: [name] [name] MRN: [id number] DOB/AGE: 1960/11/28 62 y.o.  Admit date: 07/08/2022 Discharge date: 07/14/2022  Primary Diagnosis: Right knee dislocation and patella tendon tear   Admission Diagnoses:  Past Medical History:  Diagnosis Date   Complication of anesthesia    "irregular heartbeat" during neck surgery   Depression    Fibroid    Rheumatoid arthritis    Urinary incontinence    Discharge Diagnoses:   Principal Problem:   Failed total knee arthroplasty Active Problems:   Painful total knee replacement, right  Estimated body mass index is 31.77 kg/m as calculated from the following:   Height as of this encounter: 5\' 2"  (1.575 m).   Weight as of this encounter: 78.8 kg.  Procedure:  Procedure(s) (LRB): RIGHT OPEN REDUCTION OF KNEE DISLOCATION, POSSIBLE POLY REVSION (Right)   Consults: None  HPI: The patient is a 62 year old female who underwent a right total knee arthroplasty revision 2 weeks ago.  She was doing extremely well and then 2 nights ago noted a pop and inability to bear weight since.  She was seen in the  Emergency Room yesterday afternoon and noted to have a dislocation of the prosthesis.  She was neurovascularly intact.  She presents today for the above-mentioned procedure.  Laboratory Data: Admission on 07/08/2022  Component Date Value Ref Range Status   Sodium 07/08/2022 135  135 - 145 mmol/L Final   Potassium 07/08/2022 4.3  3.5 - 5.1 mmol/L Final   Chloride 07/08/2022 103  98 - 111 mmol/L Final   CO2 07/08/2022 24  22 - 32 mmol/L Final   Glucose, Bld 07/08/2022 102 (H)  70 - 99 mg/dL Final   Glucose reference range applies only to samples taken after fasting for at least 8 hours.   BUN 07/08/2022 12  6 - 20 mg/dL Final   Creatinine, Ser 07/08/2022 0.72  0.44 - 1.00 mg/dL Final   Calcium 40/98/119104/05/2022 9.2  8.9 - 10.3 mg/dL Final   GFR, Estimated 07/08/2022 >60  >60 mL/min Final   Comment:  (NOTE) Calculated using the CKD-EPI Creatinine Equation (2021)    Anion gap 07/08/2022 8  5 - 15 Final   Performed at [facility], [address] [place], [place] [address]   WBC 07/08/2022 12.8 (H)  4.0 - 10.5 K/uL Final   RBC 07/08/2022 4.36  3.87 - 5.11 MIL/uL Final   Hemoglobin 07/08/2022 12.1  12.0 - 15.0 g/dL Final   HCT 56/21/308604/05/2022 38.8  36.0 - 46.0 % Final   MCV 07/08/2022 89.0  80.0 - 100.0 fL Final   MCH 07/08/2022 27.8  26.0 - 34.0 pg Final   MCHC 07/08/2022 31.2  30.0 - 36.0 g/dL Final   RDW 57/84/696204/05/2022 15.7 (H)  11.5 - 15.5 % Final   Platelets 07/08/2022 524 (H)  150 - 400 K/uL Final   nRBC 07/08/2022 0.0  0.0 - 0.2 % Final   Performed at [facility], [address] [place], [place] [address]   HIV Screen 4th Generation wRfx 07/08/2022 Non Reactive  Non Reactive Final   Performed at [facility], [address] [place], [place] [address]   MRSA, PCR 07/09/2022 NEGATIVE  NEGATIVE Final   Staphylococcus aureus 07/09/2022 NEGATIVE  NEGATIVE Final   Comment: (NOTE) The Xpert SA Assay (FDA approved for NASAL specimens in patients 60 years of age and older), is one component of a comprehensive surveillance program. It is not intended  to diagnose infection nor to guide or monitor treatment. Performed at [facility], [address], [place], [place] [address]    Glucose-Capillary 07/09/2022 136 (H)  70 - 99 mg/dL Final   Glucose reference range applies only to samples taken after fasting for at least 8 hours.   WBC 07/10/2022 9.3  4.0 - 10.5 K/uL Final   RBC 07/10/2022 3.98  3.87 - 5.11 MIL/uL Final   Hemoglobin 07/10/2022 11.3 (L)  12.0 - 15.0 g/dL Final   HCT 60/45/4098 36.0  36.0 - 46.0 % Final   MCV 07/10/2022 90.5  80.0 - 100.0 fL Final   MCH 07/10/2022 28.4  26.0 - 34.0 pg Final   MCHC 07/10/2022 31.4  30.0 - 36.0 g/dL Final   RDW 11/91/4782 15.4  11.5 - 15.5 % Final   Platelets 07/10/2022 466 (H)  150  - 400 K/uL Final   nRBC 07/10/2022 0.0  0.0 - 0.2 % Final   Performed at [facility], [address], [place], [place] [address]   Sodium 07/10/2022 135  135 - 145 mmol/L Final   Potassium 07/10/2022 4.4  3.5 - 5.1 mmol/L Final   Chloride 07/10/2022 104  98 - 111 mmol/L Final   CO2 07/10/2022 25  22 - 32 mmol/L Final   Glucose, Bld 07/10/2022 149 (H)  70 - 99 mg/dL Final   Glucose reference range applies only to samples taken after fasting for at least 8 hours.   BUN 07/10/2022 14  6 - 20 mg/dL Final   Creatinine, Ser 07/10/2022 0.72  0.44 - 1.00 mg/dL Final   Calcium 30/86/5784 8.6 (L)  8.9 - 10.3 mg/dL Final   GFR, Estimated 07/10/2022 >60  >60 mL/min Final   Comment: (NOTE) Calculated using the CKD-EPI Creatinine Equation (2021)    Anion gap 07/10/2022 6  5 - 15 Final   Performed at [facility], [address] [place], [place] [address]   WBC 07/11/2022 14.3 (H)  4.0 - 10.5 K/uL Final   RBC 07/11/2022 3.60 (L)  3.87 - 5.11 MIL/uL Final   Hemoglobin 07/11/2022 10.1 (L)  12.0 - 15.0 g/dL Final   HCT 52/84/1324 32.3 (L)  36.0 - 46.0 % Final   MCV 07/11/2022 89.7  80.0 - 100.0 fL Final   MCH 07/11/2022 28.1  26.0 - 34.0 pg Final   MCHC 07/11/2022 31.3  30.0 - 36.0 g/dL Final   RDW 40/01/2724 15.4  11.5 - 15.5 % Final   Platelets 07/11/2022 433 (H)  150 - 400 K/uL Final   nRBC 07/11/2022 0.0  0.0 - 0.2 % Final   Performed at [facility], [address] [place], [place] [address]  Admission on 06/25/2022, Discharged on 06/29/2022  Component Date Value Ref Range Status   WBC 06/26/2022 12.4 (H)  4.0 - 10.5 K/uL Final   RBC 06/26/2022 4.01  3.87 - 5.11 MIL/uL Final   Hemoglobin 06/26/2022 11.3 (L)  12.0 - 15.0 g/dL Final   HCT 03/47/4259 35.8 (L)  36.0 - 46.0 % Final   MCV 06/26/2022 89.3  80.0 - 100.0 fL Final   MCH 06/26/2022 28.2  26.0 - 34.0 pg Final   MCHC 06/26/2022 31.6  30.0 - 36.0 g/dL Final   RDW 56/38/7564 15.0   11.5 - 15.5 % Final   Platelets 06/26/2022 266  150 - 400 K/uL Final   nRBC 06/26/2022 0.0  0.0 - 0.2 % Final   Performed at [facility], [address], [place], [place] [address]   Sodium 06/26/2022 136  135 - 145 mmol/L Final   Potassium 06/26/2022 4.1  3.5 - 5.1 mmol/L Final   Chloride 06/26/2022 105  98 - 111 mmol/L Final   CO2 06/26/2022 25  22 - 32 mmol/L Final   Glucose, Bld 06/26/2022 164 (H)  70 - 99 mg/dL Final   Glucose reference range applies only to samples taken after fasting for at least 8 hours.   BUN 06/26/2022 14  6 - 20 mg/dL Final   Creatinine, Ser 06/26/2022 0.77  0.44 - 1.00 mg/dL Final   Calcium 60/45/4098 8.6 (L)  8.9 - 10.3 mg/dL Final   GFR, Estimated 06/26/2022 >60  >60 mL/min Final   Comment: (NOTE) Calculated using the CKD-EPI Creatinine Equation (2021)    Anion gap 06/26/2022 6  5 - 15 Final   Performed at [facility], [address] [place], [place] [address]   WBC 06/27/2022 11.5 (H)  4.0 - 10.5 K/uL Final   RBC 06/27/2022 3.74 (L)  3.87 - 5.11 MIL/uL Final   Hemoglobin 06/27/2022 10.6 (L)  12.0 - 15.0 g/dL Final   HCT 78/29/5621 33.7 (L)  36.0 - 46.0 % Final   MCV 06/27/2022 90.1  80.0 - 100.0 fL Final   MCH 06/27/2022 28.3  26.0 - 34.0 pg Final   MCHC 06/27/2022 31.5  30.0 - 36.0 g/dL Final   RDW 30/86/5784 15.1  11.5 - 15.5 % Final   Platelets 06/27/2022 272  150 - 400 K/uL Final   nRBC 06/27/2022 0.0  0.0 - 0.2 % Final   Performed at [facility], [address], [place], [place] [address]  Hospital Outpatient Visit on 06/12/2022  Component Date Value Ref Range Status   Sodium 06/12/2022 135  135 - 145 mmol/L Final   Potassium 06/12/2022 4.0  3.5 - 5.1 mmol/L Final   Chloride 06/12/2022 106  98 - 111 mmol/L Final   CO2 06/12/2022 25  22 - 32 mmol/L Final   Glucose, Bld 06/12/2022 84  70 - 99 mg/dL Final   Glucose reference range applies only to samples taken after fasting for at least  8 hours.   BUN 06/12/2022 15  6 - 20 mg/dL Final   Creatinine, Ser 06/12/2022 0.55  0.44 - 1.00 mg/dL Final   Calcium 52/84/1324 8.9  8.9 - 10.3 mg/dL Final   GFR, Estimated 06/12/2022 >60  >60 mL/min Final   Comment: (NOTE) Calculated using the CKD-EPI Creatinine Equation (2021)    Anion gap 06/12/2022 4 (L)  5 - 15 Final   Performed at [facility], [address] [place], [place] [address]   WBC 06/12/2022 6.7  4.0 - 10.5 K/uL Final   RBC 06/12/2022 4.70  3.87 - 5.11 MIL/uL Final   Hemoglobin 06/12/2022 13.1  12.0 - 15.0 g/dL Final   HCT 72/53/6644 41.8  36.0 - 46.0 % Final   MCV 06/12/2022 88.9  80.0 - 100.0 fL Final   MCH 06/12/2022 27.9  26.0 - 34.0 pg Final   MCHC 06/12/2022 31.3  30.0 - 36.0 g/dL Final   RDW 03/47/4259 15.1  11.5 - 15.5 % Final   Platelets 06/12/2022 280  150 - 400 K/uL Final   nRBC 06/12/2022 0.0  0.0 - 0.2 % Final   Performed at [facility], [address], [place], [place] [address]   ABO/RH(D) 06/12/2022 O POS   Final  Antibody Screen 06/12/2022 NEG   Final   Sample Expiration 06/12/2022 06/26/2022,2359   Final   Extend sample reason 06/12/2022    Final                   Value:NO TRANSFUSIONS OR PREGNANCY IN THE PAST 3 MONTHS Performed at [facility], [address], [place], [place] [address]    MRSA, PCR 06/12/2022 NEGATIVE  NEGATIVE Final   Staphylococcus aureus 06/12/2022 NEGATIVE  NEGATIVE Final   Comment: (NOTE) The Xpert SA Assay (FDA approved for NASAL specimens in patients 49 years of age and older), is one component of a comprehensive surveillance program. It is not intended to diagnose infection nor to guide or monitor treatment. Performed at [facility], [address] [place], [place] [address]      X-Rays:DG Knee Complete 4 Views Right  Result Date: 07/08/2022 CLINICAL DATA:  Right knee pain. History of knee surgery 2 weeks ago EXAM: RIGHT KNEE -  COMPLETE 4+ VIEW COMPARISON:  None Available. FINDINGS: Postsurgical changes from constrained right total knee arthroplasty with long stem tibial and femoral components. The tibial component appears anteriorly translated relative to the femoral component. No periprosthetic lucency or fracture identified. Patella Alta alignment. Diffuse soft tissue swelling. IMPRESSION: 1. Postsurgical changes from constrained right total knee arthroplasty. The tibial component is anteriorly translated relative to the femoral component. Orthopedic surgery evaluation is recommended. 2. Patella alta alignment. Appearance is concerning for underlying patellar tendon disruption. Electronically Signed   By: Duanne Guess D.O.   On: 07/08/2022 14:52    EKG: Orders placed or performed during the hospital encounter of 11/20/21   EKG 12-Lead   EKG 12-Lead     Hospital Course: [name] [name] is a 62 y.o. who was admitted to [facility]. They were brought to the operating room on 07/09/2022 and underwent Procedure(s): RIGHT OPEN REDUCTION OF KNEE DISLOCATION, POSSIBLE POLY REVSION.  Patient tolerated the procedure well and was later transferred to the recovery room and then to the orthopaedic floor for postoperative care. They were given PO and IV analgesics for pain control following their surgery. They were given 24 hours of postoperative antibiotics of  Anti-infectives (From admission, onward)    Start     Dose/Rate Route Frequency Ordered Stop   07/09/22 2200  ceFAZolin (ANCEF) IVPB 2g/100 mL premix        2 g 200 mL/hr over 30 Minutes Intravenous Every 6 hours 07/09/22 1850 07/10/22 0406   07/09/22 1315  ceFAZolin (ANCEF) IVPB 2g/100 mL premix        2 g 200 mL/hr over 30 Minutes Intravenous On call to O.R. 07/09/22 1256 07/09/22 1616      and started on DVT prophylaxis in the form of Xarelto.   PT and OT were ordered for total joint protocol. Discharge planning consulted to help with postop  disposition and equipment needs. Patient had a good night on the evening of surgery. They started to get up OOB with therapy on POD #1. Continued to work with therapy into POD #2. Dressing was changed and the incision was clean, dry and intact. Social work was consulted for SNF placement. She continued to work with physical therapy over the weekend with uncomplicated hospital course. She was discharged on POD #5 to SNF in stable condition.  Diet: Regular diet  Activity: WBAT No ROM to the right knee. Maintain knee immobilizer.  Follow-up: in 2 weeks Disposition: Skilled nursing facility  Discharged Condition: stable    Allergies as of 07/14/2022       Reactions   Sulfa Antibiotics Shortness Of Breath   Morphine And Related Rash   Penicillins Rash   Tolerated Cephalosporin Date: 12/05/21.        Medication List     TAKE these medications    acetaminophen 500 MG tablet Commonly known as: TYLENOL Take 1,000 mg by mouth every 6 (six) hours as needed for pain.   citalopram 20 MG tablet Commonly known as: CeleXA Take 1 tablet (20 mg total) by mouth daily. What changed:  when to take this reasons to take this   folic acid 1 MG tablet Commonly known as: FOLVITE Take 2 mg by mouth daily.   gabapentin 300 MG capsule Commonly known as: NEURONTIN Take 1 capsule 3 times a day for 2 weeks following surgery.Then take 1 capsule 2 times a day for 2 weeks. Then take 1 capsule daily for 2 weeks. Then discontinue. What changed: when to take this   methocarbamol 500 MG tablet Commonly known as: ROBAXIN Take 1 tablet (500 mg total) by mouth every 6 (six) hours as needed for muscle spasms.   methotrexate 2.5 MG tablet Commonly known as: RHEUMATREX TAKE 7 TABLETS(17.5 MG TOTAL) BY MOUTH ONCE A WEEK. PROTECT FROM LIGHT What changed:  how much to take how to take this when to take this additional instructions   Orencia ClickJect 125 MG/ML Soaj Generic drug: Abatacept INJECT ONE  CLICKJECT PEN UNDER THE SKIN EVERY WEEK. REFRIGERATE. ALLOW TO WARM TO ROOM TEMPERATURE PRIOR TO ADMINISTRATION. What changed: See the new instructions.   oxyCODONE 5 MG immediate release tablet Commonly known as: Oxy IR/ROXICODONE Take 1-2 tablets (5-10 mg total) by mouth every 6 (six) hours as needed for severe pain.   rivaroxaban 10 MG Tabs tablet Commonly known as: XARELTO Take 1 tablet (10 mg total) by mouth daily with breakfast for 18 days. Then take one 81 mg aspirin once a day for three weeks. Then discontinue aspirin. What changed: additional instructions        Follow-up Information     Aluisio, Homero Fellers, MD. Schedule an appointment as soon as possible for a visit in 2 week(s).   Specialty: Orthopedic Surgery Contact information: [address] [place] [address] [phone]                 Signed: Arther Abbott, PA-C Orthopedic Surgery 07/14/2022, 7:27 AM

## 2022-07-14 NOTE — TOC Transition Note (Signed)
Transition of Care [facility]) - CM/SW Discharge Note   Patient Details  Name: [name] [name] MRN: [id number] Date of Birth: [birth year]  [facility] CM/SW Contact:  Amada Jupiter, LCSW Phone Number: 07/14/2022, 3:12 PM   Clinical Narrative:    Reviewed SNF bed offers with pt and she accepted bed at [facility].  Bed ready for admission today.  PTAR called at 3:00pm.  RN to call report to [phone].  No futher TOC needs.   Final next level of care: Skilled Nursing Facility Barriers to Discharge: Barriers Resolved   Patient Goals and CMS Choice CMS Medicare.gov Compare Post Acute Care list provided to:: Patient Choice offered to / list presented to : Patient  Discharge Placement PASRR number recieved: 07/11/22 PASRR number recieved: 07/11/22            Patient chooses bed at: [facility] Patient to be transferred to facility by: PTAR Name of family member notified: son Patient and family notified of of transfer: 07/14/22  Discharge Plan and Services Additional resources added to the After Visit Summary for   In-house Referral: Clinical Social Work   Post Acute Care Choice: Skilled Nursing Facility                               Social Determinants of Health (SDOH) Interventions SDOH Screenings   Food Insecurity: Food Insecurity Present (07/09/2022)  Housing: Low Risk  (07/09/2022)  Transportation Needs: No Transportation Needs (07/09/2022)  Utilities: Not At Risk (07/09/2022)  Tobacco Use: Low Risk  (07/10/2022)     Readmission Risk Interventions    07/14/2022    3:11 PM  Readmission Risk Prevention Plan  Post Dischage Appt Complete  Medication Screening Complete  Transportation Screening Complete

## 2022-07-14 NOTE — Progress Notes (Signed)
Patient discharged to [facility] via [facility]. All belongings w/ patient. Report called to [place] at facility.

## 2022-07-14 NOTE — Progress Notes (Addendum)
Physical Therapy Treatment Patient Details Name: [name] [name] MRN: [id number] DOB: 19-Apr-1960 Today's Date: 07/14/2022   History of Present Illness Pt is 62 yo female s/p right knee polyethylene revision and patellar tendon repair on 07/09/22 and recently had R TKA revision on 06/25/22.  Pt with hx including but not limited to RA, R TKA 1996, L TKA 1989 with revision 11/2021, C spine fusion, and prior foot surgeries    PT Comments    Pt using gait belt to assist RLE to EOB, needing min A for eccentric lowering to floor. Cues for RLE placement and hand placement with transfers, needing min A to power up from elevated bed with increased time and effort. Once standing, pt ambulates 44 ft with RW, slow step-to gait pattern with decreased RLE WB and stance time, recliner follow per pt request. Pt limited by R knee pain and UE fatigue with heavy reliance on RW. Pt tolerates remaining up in recliner at EOS, hopeful to d/c to SNF today for continued physical therapy.   Recommendations for follow up therapy are one component of a multi-disciplinary discharge planning process, led by the attending physician.  Recommendations may be updated based on patient status, additional functional criteria and insurance authorization.  Follow Up Recommendations       Assistance Recommended at Discharge Frequent or constant Supervision/Assistance  Patient can return home with the following Assistance with cooking/housework;A little help with bathing/dressing/bathroom;A lot of help with walking and/or transfers   Equipment Recommendations  None recommended by PT    Recommendations for Other Services       Precautions / Restrictions Precautions Precautions: Fall;Knee Precaution Comments: no right knee flexion Required Braces or Orthoses: Knee Immobilizer - Right Knee Immobilizer - Right: On at all times Restrictions Weight Bearing Restrictions: No RLE Weight Bearing: Weight bearing as tolerated      Mobility  Bed Mobility Overal bed mobility: Needs Assistance Bed Mobility: Supine to Sit  Supine to sit: Min assist  General bed mobility comments: pt using strap to assist RLE to EOB, therapist assisting with eccentric lowering to floor    Transfers Overall transfer level: Needs assistance Equipment used: Rolling walker (2 wheels) Transfers: Sit to/from Stand Sit to Stand: Min assist, From elevated surface  General transfer comment: elevated bed, cues for RLE positioning and BUE placement, min A to power up to standing with increased time    Ambulation/Gait Ambulation/Gait assistance: Min guard Gait Distance (Feet): 44 Feet Assistive device: Rolling walker (2 wheels) Gait Pattern/deviations: Step-to pattern, Antalgic, Decreased stance time - right, Decreased weight shift to right Gait velocity: decreased  General Gait Details: slow, step-to gait pattern with decreased RLE stance time and WB, straightline gait without turns, limited by pain and UE fatigue   Stairs             Wheelchair Mobility    Modified Rankin (Stroke Patients Only)       Balance Overall balance assessment: Needs assistance Sitting-balance support: No upper extremity supported, Feet supported Sitting balance-Leahy Scale: Good  Standing balance support: Reliant on assistive device for balance, Bilateral upper extremity supported, During functional activity Standing balance-Leahy Scale: Poor     Cognition Arousal/Alertness: Awake/alert Behavior During Therapy: WFL for tasks assessed/performed Overall Cognitive Status: Within Functional Limits for tasks assessed     Exercises      General Comments        Pertinent Vitals/Pain Pain Assessment Pain Assessment: 0-10 Pain Score: 4  Pain Location: R knee Pain Descriptors /  Indicators: Sore, Aching, Burning Pain Intervention(s): Limited activity within patient's tolerance, Monitored during session, Premedicated before session,  Repositioned, Ice applied    Home Living                          Prior Function            PT Goals (current goals can now be found in the care plan section) Acute Rehab PT Goals Patient Stated Goal: Regain IND PT Goal Formulation: With patient Time For Goal Achievement: 07/17/22 Potential to Achieve Goals: Good Progress towards PT goals: Progressing toward goals    Frequency    7X/week      PT Plan Current plan remains appropriate    Co-evaluation              AM-PAC PT "6 Clicks" Mobility   Outcome Measure  Help needed turning from your back to your side while in a flat bed without using bedrails?: A Little Help needed moving from lying on your back to sitting on the side of a flat bed without using bedrails?: A Little Help needed moving to and from a bed to a chair (including a wheelchair)?: A Little Help needed standing up from a chair using your arms (e.g., wheelchair or bedside chair)?: A Little Help needed to walk in hospital room?: A Little Help needed climbing 3-5 steps with a railing? : Total 6 Click Score: 16    End of Session Equipment Utilized During Treatment: Gait belt;Right knee immobilizer Activity Tolerance: Patient tolerated treatment well;Patient limited by pain Patient left: in chair;with call bell/phone within reach;with chair alarm set Nurse Communication: Mobility status PT Visit Diagnosis: Other abnormalities of gait and mobility (R26.89)     Time: [id number] PT Time Calculation (min) (ACUTE ONLY): 24 min  Charges:  $Gait Training: 8-22 mins $Therapeutic Activity: 8-22 mins                      Tori Braycen Burandt PT, DPT 07/14/22, 10:27 AM

## 2022-07-14 NOTE — Plan of Care (Signed)
Plan of care reviewed and discussed. Problem: Education: Goal: Knowledge of the prescribed therapeutic regimen will improve Outcome: Completed/Met   Problem: Activity: Goal: Ability to avoid complications of mobility impairment will improve Outcome: Progressing   Problem: Clinical Measurements: Goal: Postoperative complications will be avoided or minimized Outcome: Progressing   Problem: Pain Management: Goal: Pain level will decrease with appropriate interventions Outcome: Progressing   Problem: Skin Integrity: Goal: Will show signs of wound healing Outcome: Progressing   Problem: Health Behavior/Discharge Planning: Goal: Ability to manage health-related needs will improve Outcome: Progressing

## 2022-10-30 NOTE — Progress Notes (Addendum)
COVID Vaccine received:  []  No [x]  Yes Date of any COVID positive Test in last 90 days: No PCP - Ardean Larsen MD Cardiologist - none  Chest x-ray -  EKG -  11/20/21 Epic Stress Test -  ECHO -  Cardiac Cath -   Bowel Prep - [x]  No  []   Yes ______  Pacemaker / ICD device [x]  No []  Yes   Spinal Cord Stimulator:[x]  No []  Yes       History of Sleep Apnea? [x]  No []  Yes   CPAP used?- [x]  No []  Yes    Does the patient monitor blood sugar?          [x]  No []  Yes  []  N/A  Patient has: []  NO Hx DM   [x]  Pre-DM                 []  DM1  []   DM2 Does patient have a [employer] or Dexacom? []  No []  Yes   Fasting Blood Sugar Ranges-  Checks Blood Sugar _____ times a day  GLP1 agonist / usual dose - No GLP1 instructions:  SGLT-2 inhibitors / usual dose - No SGLT-2 instructions:   Blood Thinner / Instructions:No Aspirin Instructions:No  Comments:   Activity level: Patient is unable to climb a flight of stairs without difficulty; [x]  No CP  [x]  No SOB, but would have _Pain in R knee__   Patient can perform ADLs without assistance.   Anesthesia review:   Patient denies shortness of breath, fever, cough and chest pain at PAT appointment.  Patient verbalized understanding and agreement to the Pre-Surgical Instructions that were given to them at this PAT appointment. Patient was also educated of the need to review these PAT instructions again prior to his/her surgery.I reviewed the appropriate phone numbers to call if they have any and questions or concerns.

## 2022-10-30 NOTE — Patient Instructions (Addendum)
SURGICAL WAITING ROOM VISITATION  Patients having surgery or a procedure may have no more than 2 support people in the waiting area - these visitors may rotate.    Children under the age of 65 must have an adult with them who is not the patient.  Due to an increase in RSV and influenza rates and associated hospitalizations, children ages 41 and under may not visit patients in [facility] hospitals.  If the patient needs to stay at the hospital during part of their recovery, the visitor guidelines for inpatient rooms apply. Pre-op nurse will coordinate an appropriate time for 1 support person to accompany patient in pre-op.  This support person may not rotate.    Please refer to the [facility] website for the visitor guidelines for Inpatients (after your surgery is over and you are in a regular room).       Your procedure is scheduled on: 11/01/22   Report to [facility] Emergency Entrance    Report to admitting at 5:15 AM   Do not eat food :After Midnight.   After Midnight you may have the following liquids until 4:30 AM DAY OF SURGERY  Water Non-Citrus Juices (without pulp, NO RED-Apple, White grape, White cranberry) Black Coffee (NO MILK/CREAM OR CREAMERS, sugar ok)  Clear Tea (NO MILK/CREAM OR CREAMERS, sugar ok) regular and decaf                             Plain Jell-O (NO RED)                                           Fruit ices (not with fruit pulp, NO RED)                                     Popsicles (NO RED)                                                               Sports drinks like Gatorade (NO RED)                  The day of surgery:  Drink ONE (1) Pre-Surgery Clear Ensure at 4:30 AM the morning of surgery. Drink in one sitting. Do not sip.  This drink was given to you during your hospital  pre-op appointment visit. Nothing else to drink after completing the  Pre-Surgery Clear Ensure     Oral Hygiene is also important to reduce your risk of  infection.                                    Remember - BRUSH YOUR TEETH THE MORNING OF SURGERY WITH YOUR REGULAR TOOTHPASTE   Stop all vitamins and herbal supplements 7 days before surgery.   Take these medicines the morning of surgery : Tylenolif needed,  Citaprolam and Gabapentin if needed             You may not have any metal  on your body including hair pins, jewelry, and body piercing             Do not wear make-up, lotions, powders, perfumes, or deodorant  Do not wear nail polish including gel and S&S, artificial/acrylic nails, or any other type of covering on natural nails including finger and toenails. If you have artificial nails, gel coating, etc. that needs to be removed by a nail salon please have this removed prior to surgery or surgery may need to be canceled/ delayed if the surgeon/ anesthesia feels like they are unable to be safely monitored.   Do not shave  48 hours prior to surgery.    Do not bring valuables to the hospital. [name] IS NOT             RESPONSIBLE   FOR VALUABLES.   Contacts, glasses, dentures or bridgework may not be worn into surgery.   Bring small overnight bag day of surgery.   DO NOT BRING YOUR HOME MEDICATIONS TO THE HOSPITAL. PHARMACY WILL DISPENSE MEDICATIONS LISTED ON YOUR MEDICATION LIST TO YOU DURING YOUR ADMISSION IN THE HOSPITAL!    Patients discharged on the day of surgery will not be allowed to drive home.  Someone NEEDS to stay with you for the first 24 hours after anesthesia.   Special Instructions: Bring a copy of your healthcare power of attorney and living will documents the day of surgery if you haven't scanned them before.              Please read over the following fact sheets you were given: IF YOU HAVE QUESTIONS ABOUT YOUR PRE-OP INSTRUCTIONS PLEASE CALL [phone] [name]   If you received a COVID test during your pre-op visit  it is requested that you wear a mask when out in public, stay away from anyone that may not  be feeling well and notify your surgeon if you develop symptoms. If you test positive for Covid or have been in contact with anyone that has tested positive in the last 10 days please notify you surgeon.    [name] - Preparing for Surgery Before surgery, you can play an important role.  Because skin is not sterile, your skin needs to be as free of germs as possible.  You can reduce the number of germs on your skin by washing with CHG (chlorahexidine gluconate) soap before surgery.  CHG is an antiseptic cleaner which kills germs and bonds with the skin to continue killing germs even after washing. Please DO NOT use if you have an allergy to CHG or antibacterial soaps.  If your skin becomes reddened/irritated stop using the CHG and inform your nurse when you arrive at Short Stay. Do not shave (including legs and underarms) for at least 48 hours prior to the first CHG shower.  You may shave your face/neck.  Please follow these instructions carefully:  1.  Shower with CHG Soap the night before surgery and the  morning of surgery.  2.  If you choose to wash your hair, wash your hair first as usual with your normal  shampoo.  3.  After you shampoo, rinse your hair and body thoroughly to remove the shampoo.                             4.  Use CHG as you would any other liquid soap.  You can apply chg directly to the skin and wash.  Gently with a scrungie or clean washcloth.  5.  Apply the CHG Soap to your body ONLY FROM THE NECK DOWN.   Do   not use on face/ open                           Wound or open sores. Avoid contact with eyes, ears mouth and   genitals (private parts).                       Wash face,  Genitals (private parts) with your normal soap.             6.  Wash thoroughly, paying special attention to the area where your    surgery  will be performed.  7.  Thoroughly rinse your body with warm water from the neck down.  8.  DO NOT shower/wash with your normal soap after using and rinsing  off the CHG Soap.                9.  Pat yourself dry with a clean towel.            10.  Wear clean pajamas.            11.  Place clean sheets on your bed the night of your first shower and do not  sleep with pets. Day of Surgery : Do not apply any lotions/deodorants the morning of surgery.  Please wear clean clothes to the hospital/surgery center.  FAILURE TO FOLLOW THESE INSTRUCTIONS MAY RESULT IN THE CANCELLATION OF YOUR SURGERY  PATIENT SIGNATURE_________________________________  NURSE SIGNATURE__________________________________  [name] An incentive spirometer is a tool that can help keep your lungs clear and active. This tool measures how well you are filling your lungs with each breath. Taking long deep breaths may help reverse or decrease the chance of developing breathing (pulmonary) problems (especially infection) following: A long period of time when you are unable to move or be active. BEFORE THE PROCEDURE  If the spirometer includes an indicator to show your best effort, your nurse or respiratory therapist will set it to a desired goal. If possible, sit up straight or lean slightly forward. Try not to slouch. Hold the incentive spirometer in an upright position. INSTRUCTIONS FOR USE  Sit on the edge of your bed if possible, or sit up as far as you can in bed or on a chair. Hold the incentive spirometer in an upright position. Breathe out normally. Place the mouthpiece in your mouth and seal your lips tightly around it. Breathe in slowly and as deeply as possible, raising the piston or the ball toward the top of the column. Hold your breath for 3-5 seconds or for as long as possible. Allow the piston or ball to fall to the bottom of the column. Remove the mouthpiece from your mouth and breathe out normally. Rest for a few seconds and repeat Steps 1 through 7 at least 10 times every 1-2 hours when you are awake. Take your time and take a few normal breaths  between deep breaths. The spirometer may include an indicator to show your best effort. Use the indicator as a goal to work toward during each repetition. After each set of 10 deep breaths, practice coughing to be sure your lungs are clear. If you have an incision (the cut made at the time of surgery), support your incision when coughing by placing a pillow or  rolled up towels firmly against it. Once you are able to get out of bed, walk around indoors and cough well. You may stop using the incentive spirometer when instructed by your caregiver.  RISKS AND COMPLICATIONS Take your time so you do not get dizzy or light-headed. If you are in pain, you may need to take or ask for pain medication before doing incentive spirometry. It is harder to take a deep breath if you are having pain. AFTER USE Rest and breathe slowly and easily. It can be helpful to keep track of a log of your progress. Your caregiver can provide you with a simple table to help with this. If you are using the spirometer at home, follow these instructions: SEEK MEDICAL CARE IF:  You are having difficultly using the spirometer. You have trouble using the spirometer as often as instructed. Your pain medication is not giving enough relief while using the spirometer. You develop fever of 100.5 F (38.1 C) or higher. SEEK IMMEDIATE MEDICAL CARE IF:  You cough up bloody sputum that had not been present before. You develop fever of 102 F (38.9 C) or greater. You develop worsening pain at or near the incision site. MAKE SURE YOU:  Understand these instructions. Will watch your condition. Will get help right away if you are not doing well or get worse. Document Released: 08/04/2006 Document Revised: 06/16/2011 Document Reviewed: 10/05/2006 [facility] Patient Information 2014 [place], [place].

## 2022-10-30 NOTE — Progress Notes (Signed)
Please send pre op orders for pt's PST visit 10/31/22.

## 2022-10-31 ENCOUNTER — Encounter (HOSPITAL_COMMUNITY): Payer: Self-pay

## 2022-10-31 ENCOUNTER — Encounter (HOSPITAL_COMMUNITY)
Admission: RE | Admit: 2022-10-31 | Discharge: 2022-10-31 | Disposition: A | Discharge status: Home / Self Care | Payer: No Typology Code available for payment source | Source: Ambulatory Visit | Attending: Orthopedic Surgery | Admitting: Orthopedic Surgery

## 2022-10-31 ENCOUNTER — Other Ambulatory Visit: Payer: Self-pay

## 2022-10-31 VITALS — BP 113/77 | HR 96 | Temp 98.2°F | Resp 16 | Ht 62.0 in | Wt 163.0 lb

## 2022-10-31 DIAGNOSIS — Z01812 Encounter for preprocedural laboratory examination: Secondary | ICD-10-CM | POA: Diagnosis present

## 2022-10-31 DIAGNOSIS — Z01818 Encounter for other preprocedural examination: Secondary | ICD-10-CM

## 2022-10-31 HISTORY — DX: Myoneural disorder, unspecified: G70.9

## 2022-10-31 HISTORY — DX: Prediabetes: R73.03

## 2022-10-31 LAB — CBC
HCT: 41 % (ref 36.0–46.0)
Hemoglobin: 12.7 g/dL (ref 12.0–15.0)
MCH: 25.7 pg — ABNORMAL LOW (ref 26.0–34.0)
MCHC: 31 g/dL (ref 30.0–36.0)
MCV: 83 fL (ref 80.0–100.0)
Platelets: 457 10*3/uL — ABNORMAL HIGH (ref 150–400)
RBC: 4.94 MIL/uL (ref 3.87–5.11)
RDW: 16.6 % — ABNORMAL HIGH (ref 11.5–15.5)
WBC: 9.3 10*3/uL (ref 4.0–10.5)
nRBC: 0 % (ref 0.0–0.2)

## 2022-10-31 LAB — SURGICAL PCR SCREEN
MRSA, PCR: NEGATIVE
Staphylococcus aureus: NEGATIVE

## 2022-11-01 ENCOUNTER — Inpatient Hospital Stay (HOSPITAL_COMMUNITY): Payer: Medicare Other | Admitting: Anesthesiology

## 2022-11-01 ENCOUNTER — Encounter (HOSPITAL_COMMUNITY): Payer: Self-pay | Admitting: Orthopedic Surgery

## 2022-11-01 ENCOUNTER — Other Ambulatory Visit: Payer: Self-pay

## 2022-11-01 ENCOUNTER — Inpatient Hospital Stay (HOSPITAL_COMMUNITY)
Admission: RE | Admit: 2022-11-01 | Discharge: 2022-11-07 | DRG: 489 | Disposition: A | Discharge status: Home / Self Care | Payer: Medicare Other | Attending: Orthopedic Surgery | Admitting: Orthopedic Surgery

## 2022-11-01 ENCOUNTER — Encounter (HOSPITAL_COMMUNITY)
Admission: RE | Disposition: A | Discharge status: Home / Self Care | Payer: Self-pay | Source: Home / Self Care | Attending: Orthopedic Surgery

## 2022-11-01 DIAGNOSIS — R7303 Prediabetes: Secondary | ICD-10-CM | POA: Diagnosis not present

## 2022-11-01 DIAGNOSIS — Z885 Allergy status to narcotic agent status: Secondary | ICD-10-CM | POA: Diagnosis not present

## 2022-11-01 DIAGNOSIS — Z96652 Presence of left artificial knee joint: Secondary | ICD-10-CM | POA: Diagnosis present

## 2022-11-01 DIAGNOSIS — Z823 Family history of stroke: Secondary | ICD-10-CM | POA: Diagnosis not present

## 2022-11-01 DIAGNOSIS — T84018A Broken internal joint prosthesis, other site, initial encounter: Secondary | ICD-10-CM

## 2022-11-01 DIAGNOSIS — T84022A Instability of internal right knee prosthesis, initial encounter: Principal | ICD-10-CM | POA: Diagnosis present

## 2022-11-01 DIAGNOSIS — M069 Rheumatoid arthritis, unspecified: Secondary | ICD-10-CM | POA: Diagnosis not present

## 2022-11-01 DIAGNOSIS — Z880 Allergy status to penicillin: Secondary | ICD-10-CM

## 2022-11-01 DIAGNOSIS — Z96659 Presence of unspecified artificial knee joint: Secondary | ICD-10-CM

## 2022-11-01 DIAGNOSIS — Z79899 Other long term (current) drug therapy: Secondary | ICD-10-CM | POA: Diagnosis not present

## 2022-11-01 DIAGNOSIS — Z882 Allergy status to sulfonamides status: Secondary | ICD-10-CM

## 2022-11-01 DIAGNOSIS — M009 Pyogenic arthritis, unspecified: Secondary | ICD-10-CM

## 2022-11-01 DIAGNOSIS — Z800 Family history of malignant neoplasm of digestive organs: Secondary | ICD-10-CM | POA: Diagnosis not present

## 2022-11-01 DIAGNOSIS — Z8249 Family history of ischemic heart disease and other diseases of the circulatory system: Secondary | ICD-10-CM

## 2022-11-01 DIAGNOSIS — Z01818 Encounter for other preprocedural examination: Principal | ICD-10-CM

## 2022-11-01 DIAGNOSIS — S76111A Strain of right quadriceps muscle, fascia and tendon, initial encounter: Secondary | ICD-10-CM | POA: Diagnosis not present

## 2022-11-01 DIAGNOSIS — Z79631 Long term (current) use of antimetabolite agent: Secondary | ICD-10-CM | POA: Diagnosis not present

## 2022-11-01 DIAGNOSIS — Z801 Family history of malignant neoplasm of trachea, bronchus and lung: Secondary | ICD-10-CM | POA: Diagnosis not present

## 2022-11-01 DIAGNOSIS — F32A Depression, unspecified: Secondary | ICD-10-CM | POA: Diagnosis present

## 2022-11-01 DIAGNOSIS — Z803 Family history of malignant neoplasm of breast: Secondary | ICD-10-CM | POA: Diagnosis not present

## 2022-11-01 DIAGNOSIS — Z8041 Family history of malignant neoplasm of ovary: Secondary | ICD-10-CM | POA: Diagnosis not present

## 2022-11-01 DIAGNOSIS — Y792 Prosthetic and other implants, materials and accessory orthopedic devices associated with adverse incidents: Secondary | ICD-10-CM | POA: Diagnosis present

## 2022-11-01 DIAGNOSIS — T84012D Broken internal right knee prosthesis, subsequent encounter: Secondary | ICD-10-CM | POA: Diagnosis not present

## 2022-11-01 DIAGNOSIS — Z96651 Presence of right artificial knee joint: Secondary | ICD-10-CM

## 2022-11-01 HISTORY — PX: IRRIGATION AND DEBRIDEMENT KNEE: SHX5185

## 2022-11-01 LAB — BASIC METABOLIC PANEL
Anion gap: 8 (ref 5–15)
BUN: 9 mg/dL (ref 6–20)
CO2: 27 mmol/L (ref 22–32)
Calcium: 9.2 mg/dL (ref 8.9–10.3)
Chloride: 102 mmol/L (ref 98–111)
Creatinine, Ser: 0.54 mg/dL (ref 0.44–1.00)
GFR, Estimated: >60 mL/min (ref >60)
Glucose, Bld: 93 mg/dL (ref 70–99)
Potassium: 3.7 mmol/L (ref 3.5–5.1)
Sodium: 137 mmol/L (ref 135–145)

## 2022-11-01 SURGERY — IRRIGATION AND DEBRIDEMENT KNEE
Anesthesia: General | Site: Knee | Laterality: Right

## 2022-11-01 MED ORDER — PHENYLEPHRINE 80 MCG/ML (10ML) SYRINGE FOR IV PUSH (FOR BLOOD PRESSURE SUPPORT)
PREFILLED_SYRINGE | INTRAVENOUS | Status: AC
  Filled 2022-11-01: qty 10

## 2022-11-01 MED ORDER — 0.9 % SODIUM CHLORIDE (POUR BTL) OPTIME
TOPICAL | Status: DC | PRN
  Administered 2022-11-01 (×2): 3000 mL

## 2022-11-01 MED ORDER — ORAL CARE MOUTH RINSE: 15.0000 mL | Freq: Once | OROMUCOSAL | Status: AC

## 2022-11-01 MED ORDER — DEXAMETHASONE SODIUM PHOSPHATE 10 MG/ML IJ SOLN: 8.0000 mg | Freq: Once | INTRAMUSCULAR | Status: DC

## 2022-11-01 MED ORDER — METHOCARBAMOL 500 MG IVPB - SIMPLE MED
500.0000 mg | Freq: Four times a day (QID) | INTRAVENOUS | Status: DC | PRN
  Administered 2022-11-01: 500 mg via INTRAVENOUS
  Filled 2022-11-01: qty 500

## 2022-11-01 MED ORDER — DEXAMETHASONE SODIUM PHOSPHATE 10 MG/ML IJ SOLN
INTRAMUSCULAR | Status: AC
  Filled 2022-11-01: qty 1

## 2022-11-01 MED ORDER — BUPIVACAINE LIPOSOME 1.3 % IJ SUSP
INTRAMUSCULAR | Status: AC
  Filled 2022-11-01: qty 20

## 2022-11-01 MED ORDER — ONDANSETRON HCL 4 MG/2ML IJ SOLN
INTRAMUSCULAR | Status: DC | PRN
  Administered 2022-11-01: 4 mg via INTRAVENOUS

## 2022-11-01 MED ORDER — PHENOL 1.4 % MT LIQD: 1.0000 | OROMUCOSAL | Status: DC | PRN

## 2022-11-01 MED ORDER — LACTATED RINGERS IV SOLN: INTRAVENOUS | Status: DC

## 2022-11-01 MED ORDER — MENTHOL 3 MG MT LOZG: 1.0000 | LOZENGE | OROMUCOSAL | Status: DC | PRN

## 2022-11-01 MED ORDER — ONDANSETRON HCL 4 MG/2ML IJ SOLN: 4.0000 mg | Freq: Four times a day (QID) | INTRAMUSCULAR | Status: DC | PRN

## 2022-11-01 MED ORDER — CEFAZOLIN SODIUM-DEXTROSE 2-4 GM/100ML-% IV SOLN
2.0000 g | Freq: Three times a day (TID) | INTRAVENOUS | Status: DC
  Administered 2022-11-01 – 2022-11-03 (×7): 2 g via INTRAVENOUS
  Filled 2022-11-01 (×7): qty 100

## 2022-11-01 MED ORDER — LACTATED RINGERS IV SOLN: INTRAVENOUS | Status: DC | PRN

## 2022-11-01 MED ORDER — OXYCODONE HCL 5 MG PO TABS: 5.0000 mg | ORAL_TABLET | Freq: Once | ORAL | Status: DC | PRN

## 2022-11-01 MED ORDER — DOCUSATE SODIUM 100 MG PO CAPS
100.0000 mg | ORAL_CAPSULE | Freq: Two times a day (BID) | ORAL | Status: DC
  Administered 2022-11-01 – 2022-11-07 (×13): 100 mg via ORAL
  Filled 2022-11-01 (×13): qty 1

## 2022-11-01 MED ORDER — DEXAMETHASONE SODIUM PHOSPHATE 10 MG/ML IJ SOLN
INTRAMUSCULAR | Status: DC | PRN
  Administered 2022-11-01: 8 mg via INTRAVENOUS

## 2022-11-01 MED ORDER — FLEET ENEMA 7-19 GM/118ML RE ENEM: 1.0000 | ENEMA | Freq: Once | RECTAL | Status: DC | PRN

## 2022-11-01 MED ORDER — OXYCODONE HCL 5 MG/5ML PO SOLN: 5.0000 mg | Freq: Once | ORAL | Status: DC | PRN

## 2022-11-01 MED ORDER — CEFAZOLIN SODIUM-DEXTROSE 2-4 GM/100ML-% IV SOLN
INTRAVENOUS | Status: AC
  Filled 2022-11-01: qty 100

## 2022-11-01 MED ORDER — LIDOCAINE HCL (PF) 2 % IJ SOLN
INTRAMUSCULAR | Status: AC
  Filled 2022-11-01: qty 5

## 2022-11-01 MED ORDER — HYDROMORPHONE HCL 1 MG/ML IJ SOLN
INTRAMUSCULAR | Status: AC
  Filled 2022-11-01: qty 1

## 2022-11-01 MED ORDER — HYDROMORPHONE HCL 1 MG/ML IJ SOLN
0.5000 mg | INTRAMUSCULAR | Status: DC | PRN
  Administered 2022-11-01: 0.5 mg via INTRAVENOUS
  Administered 2022-11-07: 1 mg via INTRAVENOUS
  Filled 2022-11-01: qty 1

## 2022-11-01 MED ORDER — METOCLOPRAMIDE HCL 5 MG/ML IJ SOLN: 5.0000 mg | Freq: Three times a day (TID) | INTRAMUSCULAR | Status: DC | PRN

## 2022-11-01 MED ORDER — CITALOPRAM HYDROBROMIDE 20 MG PO TABS
20.0000 mg | ORAL_TABLET | Freq: Every day | ORAL | Status: DC
  Administered 2022-11-01 – 2022-11-07 (×7): 20 mg via ORAL
  Filled 2022-11-01 (×7): qty 1

## 2022-11-01 MED ORDER — MIDAZOLAM HCL 2 MG/2ML IJ SOLN
INTRAMUSCULAR | Status: AC
  Filled 2022-11-01: qty 2

## 2022-11-01 MED ORDER — ACETAMINOPHEN 10 MG/ML IV SOLN
INTRAVENOUS | Status: AC
  Filled 2022-11-01: qty 100

## 2022-11-01 MED ORDER — SODIUM CHLORIDE (PF) 0.9 % IJ SOLN
INTRAMUSCULAR | Status: AC
  Filled 2022-11-01: qty 50

## 2022-11-01 MED ORDER — FENTANYL CITRATE (PF) 100 MCG/2ML IJ SOLN
INTRAMUSCULAR | Status: AC
  Filled 2022-11-01: qty 2

## 2022-11-01 MED ORDER — LIDOCAINE HCL (CARDIAC) PF 100 MG/5ML IV SOSY
PREFILLED_SYRINGE | INTRAVENOUS | Status: DC | PRN
  Administered 2022-11-01: 60 mg via INTRAVENOUS

## 2022-11-01 MED ORDER — ONDANSETRON HCL 4 MG PO TABS
4.0000 mg | ORAL_TABLET | Freq: Four times a day (QID) | ORAL | Status: DC | PRN
  Filled 2022-11-01: qty 1

## 2022-11-01 MED ORDER — POVIDONE-IODINE 10 % EX SWAB: 2.0000 | Freq: Once | CUTANEOUS | Status: DC

## 2022-11-01 MED ORDER — PROPOFOL 10 MG/ML IV BOLUS
INTRAVENOUS | Status: AC
  Filled 2022-11-01: qty 20

## 2022-11-01 MED ORDER — SODIUM CHLORIDE (PF) 0.9 % IJ SOLN
INTRAMUSCULAR | Status: AC
  Filled 2022-11-01: qty 10

## 2022-11-01 MED ORDER — TRANEXAMIC ACID-NACL 1000-0.7 MG/100ML-% IV SOLN
1000.0000 mg | INTRAVENOUS | Status: AC
  Administered 2022-11-01: 1000 mg via INTRAVENOUS

## 2022-11-01 MED ORDER — ACETAMINOPHEN 10 MG/ML IV SOLN
1000.0000 mg | Freq: Four times a day (QID) | INTRAVENOUS | Status: DC
  Administered 2022-11-01: 1000 mg via INTRAVENOUS

## 2022-11-01 MED ORDER — FENTANYL CITRATE (PF) 250 MCG/5ML IJ SOLN
INTRAMUSCULAR | Status: DC | PRN
  Administered 2022-11-01 (×2): 25 ug via INTRAVENOUS
  Administered 2022-11-01 (×3): 50 ug via INTRAVENOUS

## 2022-11-01 MED ORDER — OXYCODONE HCL 5 MG PO TABS
5.0000 mg | ORAL_TABLET | ORAL | Status: DC | PRN
  Administered 2022-11-01 – 2022-11-03 (×5): 10 mg via ORAL
  Administered 2022-11-03: 5 mg via ORAL
  Administered 2022-11-03: 10 mg via ORAL
  Administered 2022-11-03: 5 mg via ORAL
  Administered 2022-11-03 – 2022-11-07 (×11): 10 mg via ORAL
  Filled 2022-11-01: qty 1
  Filled 2022-11-01 (×10): qty 2
  Filled 2022-11-01: qty 1
  Filled 2022-11-01 (×8): qty 2

## 2022-11-01 MED ORDER — TRANEXAMIC ACID-NACL 1000-0.7 MG/100ML-% IV SOLN
INTRAVENOUS | Status: AC
  Filled 2022-11-01: qty 100

## 2022-11-01 MED ORDER — METOCLOPRAMIDE HCL 5 MG PO TABS: 5.0000 mg | ORAL_TABLET | Freq: Three times a day (TID) | ORAL | Status: DC | PRN

## 2022-11-01 MED ORDER — MIDAZOLAM HCL 2 MG/2ML IJ SOLN
INTRAMUSCULAR | Status: DC | PRN
  Administered 2022-11-01 (×2): 1 mg via INTRAVENOUS

## 2022-11-01 MED ORDER — CEFAZOLIN SODIUM-DEXTROSE 2-4 GM/100ML-% IV SOLN
2.0000 g | INTRAVENOUS | Status: AC
  Administered 2022-11-01: 2 g via INTRAVENOUS

## 2022-11-01 MED ORDER — PROPOFOL 10 MG/ML IV BOLUS
INTRAVENOUS | Status: DC | PRN
  Administered 2022-11-01: 180 mg via INTRAVENOUS

## 2022-11-01 MED ORDER — BUPIVACAINE HCL (PF) 0.5 % IJ SOLN
INTRAMUSCULAR | Status: AC
  Filled 2022-11-01: qty 30

## 2022-11-01 MED ORDER — MIRABEGRON ER 25 MG PO TB24
50.0000 mg | ORAL_TABLET | Freq: Every day | ORAL | Status: DC
  Administered 2022-11-01 – 2022-11-07 (×7): 50 mg via ORAL
  Filled 2022-11-01 (×7): qty 2

## 2022-11-01 MED ORDER — HYDROMORPHONE HCL 1 MG/ML IJ SOLN
0.5000 mg | Freq: Once | INTRAMUSCULAR | Status: AC
  Administered 2022-11-01: 0.5 mg via INTRAVENOUS

## 2022-11-01 MED ORDER — BISACODYL 10 MG RE SUPP: 10.0000 mg | Freq: Every day | RECTAL | Status: DC | PRN

## 2022-11-01 MED ORDER — CEFAZOLIN SODIUM-DEXTROSE 2-4 GM/100ML-% IV SOLN: 2.0000 g | Freq: Four times a day (QID) | INTRAVENOUS | Status: DC

## 2022-11-01 MED ORDER — DEXAMETHASONE SODIUM PHOSPHATE 10 MG/ML IJ SOLN
10.0000 mg | Freq: Once | INTRAMUSCULAR | Status: AC
  Administered 2022-11-02: 10 mg via INTRAVENOUS
  Filled 2022-11-01: qty 1

## 2022-11-01 MED ORDER — CHLORHEXIDINE GLUCONATE 0.12 % MT SOLN
15.0000 mL | Freq: Once | OROMUCOSAL | Status: AC
  Administered 2022-11-01: 15 mL via OROMUCOSAL

## 2022-11-01 MED ORDER — ACETAMINOPHEN 500 MG PO TABS
1000.0000 mg | ORAL_TABLET | Freq: Four times a day (QID) | ORAL | Status: AC
  Administered 2022-11-01 – 2022-11-02 (×4): 1000 mg via ORAL
  Filled 2022-11-01 (×4): qty 2

## 2022-11-01 MED ORDER — METHOCARBAMOL 500 MG PO TABS
500.0000 mg | ORAL_TABLET | Freq: Four times a day (QID) | ORAL | Status: DC | PRN
  Administered 2022-11-02 – 2022-11-06 (×9): 500 mg via ORAL
  Filled 2022-11-01 (×10): qty 1

## 2022-11-01 MED ORDER — ROPIVACAINE HCL 5 MG/ML IJ SOLN
INTRAMUSCULAR | Status: DC | PRN
  Administered 2022-11-01: 25 mL via PERINEURAL

## 2022-11-01 MED ORDER — DIPHENHYDRAMINE HCL 12.5 MG/5ML PO ELIX: 12.5000 mg | ORAL_SOLUTION | ORAL | Status: DC | PRN

## 2022-11-01 MED ORDER — ASPIRIN 81 MG PO CHEW
81.0000 mg | CHEWABLE_TABLET | Freq: Two times a day (BID) | ORAL | Status: DC
  Administered 2022-11-02 – 2022-11-07 (×11): 81 mg via ORAL
  Filled 2022-11-01 (×11): qty 1

## 2022-11-01 MED ORDER — POLYETHYLENE GLYCOL 3350 17 G PO PACK
17.0000 g | PACK | Freq: Every day | ORAL | Status: DC | PRN
  Administered 2022-11-05: 17 g via ORAL
  Filled 2022-11-01: qty 1

## 2022-11-01 MED ORDER — FENTANYL CITRATE PF 50 MCG/ML IJ SOSY: 25.0000 ug | PREFILLED_SYRINGE | INTRAMUSCULAR | Status: DC | PRN

## 2022-11-01 MED ORDER — BUPIVACAINE LIPOSOME 1.3 % IJ SUSP: 20.0000 mL | Freq: Once | INTRAMUSCULAR | Status: DC

## 2022-11-01 MED ORDER — PRONTOSAN WOUND IRRIGATION OPTIME
TOPICAL | Status: DC | PRN
  Administered 2022-11-01: 1

## 2022-11-01 MED ORDER — SODIUM CHLORIDE 0.9 % IV SOLN: INTRAVENOUS | Status: DC

## 2022-11-01 MED ORDER — ONDANSETRON HCL 4 MG/2ML IJ SOLN
INTRAMUSCULAR | Status: AC
  Filled 2022-11-01: qty 2

## 2022-11-01 MED ORDER — PHENYLEPHRINE 80 MCG/ML (10ML) SYRINGE FOR IV PUSH (FOR BLOOD PRESSURE SUPPORT)
PREFILLED_SYRINGE | INTRAVENOUS | Status: DC | PRN
  Administered 2022-11-01: 160 ug via INTRAVENOUS
  Administered 2022-11-01: 80 ug via INTRAVENOUS

## 2022-11-01 MED ORDER — OXYCODONE HCL 5 MG PO TABS
10.0000 mg | ORAL_TABLET | ORAL | Status: DC | PRN
  Administered 2022-11-01: 10 mg via ORAL
  Filled 2022-11-01 (×2): qty 2

## 2022-11-01 SURGICAL SUPPLY — 65 items
BAG COUNTER SPONGE SURGICOUNT (BAG) IMPLANT
BAG SPEC THK2 15X12 ZIP CLS (MISCELLANEOUS) ×1
BAG SPNG CNTER NS LX DISP (BAG)
BAG ZIPLOCK 12X15 (MISCELLANEOUS) ×2 IMPLANT
BLADE SAG 18X100X1.27 (BLADE) ×2 IMPLANT
BLADE SAW SGTL 11.0X1.19X90.0M (BLADE) ×2 IMPLANT
BLADE SURG SZ10 CARB STEEL (BLADE) IMPLANT
BNDG CMPR 6 X 5 YARDS HK CLSR (GAUZE/BANDAGES/DRESSINGS) ×1
BNDG ELASTIC 6INX 5YD STR LF (GAUZE/BANDAGES/DRESSINGS) ×2 IMPLANT
CANISTER WOUNDNEG PRESSURE 500 (CANNISTER) IMPLANT
COVER SURGICAL LIGHT HANDLE (MISCELLANEOUS) ×2 IMPLANT
CUFF TOURN SGL QUICK 34 (TOURNIQUET CUFF) ×1
CUFF TRNQT CYL 34X4.125X (TOURNIQUET CUFF) ×2 IMPLANT
DRAPE INCISE IOBAN 66X45 STRL (DRAPES) ×6 IMPLANT
DRAPE U-SHAPE 47X51 STRL (DRAPES) ×2 IMPLANT
DRSG ADAPTIC 3X8 NADH LF (GAUZE/BANDAGES/DRESSINGS) ×2 IMPLANT
DRSG AQUACEL AG ADV 3.5X10 (GAUZE/BANDAGES/DRESSINGS) ×2 IMPLANT
DRSG VAC GRANUFOAM LG (GAUZE/BANDAGES/DRESSINGS) IMPLANT
DURAPREP 26ML APPLICATOR (WOUND CARE) ×2 IMPLANT
ELECT REM PT RETURN 15FT ADLT (MISCELLANEOUS) ×2 IMPLANT
EVACUATOR 1/8 PVC DRAIN (DRAIN) ×2 IMPLANT
GAUZE PAD ABD 8X10 STRL (GAUZE/BANDAGES/DRESSINGS) ×2 IMPLANT
GAUZE SPONGE 4X4 12PLY STRL (GAUZE/BANDAGES/DRESSINGS) ×2 IMPLANT
GLOVE BIO SURGEON STRL SZ 6.5 (GLOVE) ×4 IMPLANT
GLOVE BIO SURGEON STRL SZ7 (GLOVE) IMPLANT
GLOVE BIO SURGEON STRL SZ7.5 (GLOVE) ×2 IMPLANT
GLOVE BIO SURGEON STRL SZ8 (GLOVE) ×6 IMPLANT
GLOVE BIOGEL PI IND STRL 7.0 (GLOVE) ×4 IMPLANT
GLOVE BIOGEL PI IND STRL 8 (GLOVE) ×4 IMPLANT
GLOVE ECLIPSE 8.0 STRL XLNG CF (GLOVE) IMPLANT
GLOVE SURG SS PI 7.0 STRL IVOR (GLOVE) ×2 IMPLANT
GOWN STRL REUS W/ TWL LRG LVL3 (GOWN DISPOSABLE) ×8 IMPLANT
GOWN STRL REUS W/TWL LRG LVL3 (GOWN DISPOSABLE) ×4
HANDPIECE INTERPULSE COAX TIP (DISPOSABLE) ×1
HOLDER FOLEY CATH W/STRAP (MISCELLANEOUS) IMPLANT
IMMOBILIZER KNEE 20 (SOFTGOODS) ×1 IMPLANT
IMMOBILIZER KNEE 20 THIGH 36 (SOFTGOODS) ×2 IMPLANT
INSERT TIB CRS ATTUNE SZ3 22 (Insert) IMPLANT
KIT BASIN OR (CUSTOM PROCEDURE TRAY) ×2 IMPLANT
KIT TURNOVER KIT A (KITS) IMPLANT
MANIFOLD NEPTUNE II (INSTRUMENTS) ×2 IMPLANT
NS IRRIG 1000ML POUR BTL (IV SOLUTION) ×2 IMPLANT
PACK TOTAL KNEE CUSTOM (KITS) ×2 IMPLANT
PADDING CAST COTTON 6X4 STRL (CAST SUPPLIES) ×4 IMPLANT
PROTECTOR NERVE ULNAR (MISCELLANEOUS) ×2 IMPLANT
SET HNDPC FAN SPRY TIP SCT (DISPOSABLE) ×2 IMPLANT
SOLUTION PRONTOSAN WOUND 350ML (IRRIGATION / IRRIGATOR) IMPLANT
SPIKE FLUID TRANSFER (MISCELLANEOUS) IMPLANT
STAPLER VISISTAT 35W (STAPLE) IMPLANT
STRIP CLOSURE SKIN 1/2X4 (GAUZE/BANDAGES/DRESSINGS) ×4 IMPLANT
SUT MNCRL AB 4-0 PS2 18 (SUTURE) ×2 IMPLANT
SUT STRATAFIX 0 PDS 27 VIOLET (SUTURE) ×1
SUT STRATAFIX SPIRAL PDS+ 70CM (SUTURE) ×1
SUT VIC AB 2-0 CT1 27 (SUTURE) ×3
SUT VIC AB 2-0 CT1 TAPERPNT 27 (SUTURE) ×6 IMPLANT
SUTURE STRATFX 0 PDS 27 VIOLET (SUTURE) ×2 IMPLANT
SUTURE STRATFX SPIRL PDS+ 70CM (SUTURE) IMPLANT
SWAB COLLECTION DEVICE MRSA (MISCELLANEOUS) ×2 IMPLANT
SWAB CULTURE ESWAB REG 1ML (MISCELLANEOUS) ×2 IMPLANT
TOWER CARTRIDGE SMART MIX (DISPOSABLE) ×2 IMPLANT
TRAY FOLEY MTR SLVR 16FR STAT (SET/KITS/TRAYS/PACK) ×2 IMPLANT
TUBE KAMVAC SUCTION (TUBING) IMPLANT
TUBE SUCTION HIGH CAP CLEAR NV (SUCTIONS) ×2 IMPLANT
WATER STERILE IRR 1000ML POUR (IV SOLUTION) ×2 IMPLANT
WRAP KNEE MAXI GEL POST OP (GAUZE/BANDAGES/DRESSINGS) IMPLANT

## 2022-11-01 NOTE — H&P (Signed)
[name] [name] is an 62 y.o. female.   Chief Complaint: Unstable right knee replacement HPI: [name] [name] is a 62 yo female with a complex history regarding her right knee. She had a revision TKA 06/25/22 and had a post-op episode where she had increased pain and was noted to have a knee dislocation requiring revision to a thicker polyethylene. She also had a patellar tendon disruption which was fixed in the same setting. She had a small area at the inferior aspect of her incision with hypertrophic granulation tissue that has not healed but had been making progress until 2 weeks ago. She had an increase in pain and x-rays showed anterior subluxation of the tibial component. We opted to delay surgical treatment until the wound fully healed but she had not shown any progress in healing and we opted to pursue surgery now. She presents for irrrigation and debridement, possible femoral revision to a hinged implant to address the instability.  Past Medical History:  Diagnosis Date   Complication of anesthesia    "irregular heartbeat" during neck surgery   Depression    Fibroid    Neuromuscular disorder (HCC)    Pre-diabetes    Rheumatoid arthritis (HCC)    Urinary incontinence     Past Surgical History:  Procedure Laterality Date   ABDOMINAL HYSTERECTOMY N/A 09/03/2012   Procedure: HYSTERECTOMY ABDOMINAL;  Surgeon: [name] Kyle, MD;  Location: [facility];  Service: Gynecology;  Laterality: N/A;   FOOT SURGERY Bilateral 2004   joint implants, each foot x 2. previous surgeries done on feet 1987   FOOT SURGERY Right 2008   KNEE SURGERY Right 2006   fracture knee after replacement, repaired   POSTERIOR FUSION CERVICAL SPINE  2006   REPLACEMENT TOTAL KNEE Bilateral 1989   left 1989; right 1996   SALPINGOOPHORECTOMY Bilateral 09/03/2012   Procedure: SALPINGO OOPHORECTOMY;  Surgeon: [name] Kyle, MD;  Location: [facility];  Service: Gynecology;  Laterality: Bilateral;   TONSILLECTOMY  1978    TOTAL KNEE REVISION Left 12/04/2021   Procedure: TOTAL KNEE REVISION;  Surgeon: [name] Gross, MD;  Location: [facility];  Service: Orthopedics;  Laterality: Left;   TOTAL KNEE REVISION Right 06/25/2022   Procedure: TOTAL KNEE REVISION;  Surgeon: [name] Gross, MD;  Location: [facility];  Service: Orthopedics;  Laterality: Right;   TOTAL KNEE REVISION Right 07/09/2022   Procedure: RIGHT OPEN REDUCTION OF KNEE DISLOCATION, POSSIBLE POLY REVSION;  Surgeon: [name] Gross, MD;  Location: [facility];  Service: Orthopedics;  Laterality: Right;    Family History  Problem Relation Age of Onset   Ovarian cancer Mother 43   Lung cancer Father        asbestos   Cancer Brother 28       appendiceal   Seizures Brother    Heart Problems Brother    Colon cancer Maternal Aunt    Breast cancer Paternal Aunt 17   Colon cancer Maternal Grandmother 79   Stroke Paternal Grandmother    Heart disease Paternal Grandmother    Breast cancer Paternal Aunt 76   Social History:  reports that she has never smoked. She has never used smokeless tobacco. She reports current alcohol use. She reports that she does not use drugs.  Allergies:  Allergies  Allergen Reactions   Sulfa Antibiotics Shortness Of Breath   Morphine Rash   Penicillins Rash    Tolerated Cephalosporin Date: 12/05/21.    Medications Prior to Admission  Medication Sig Dispense Refill   acetaminophen (TYLENOL)  500 MG tablet Take 1,000 mg by mouth every 6 (six) hours as needed for pain.     Calcium Citrate-Vitamin D (CALCIUM + D PO) Take 1 capsule by mouth daily.     citalopram (CELEXA) 20 MG tablet Take 1 tablet (20 mg total) by mouth daily. 30 tablet 5   cyanocobalamin (VITAMIN B12) 1000 MCG tablet Take 1,000 mcg by mouth daily.     folic acid (FOLVITE) 1 MG tablet Take 2 mg by mouth daily.     methotrexate (RHEUMATREX) 2.5 MG tablet TAKE 7 TABLETS(17.5 MG TOTAL) BY MOUTH ONCE A WEEK. PROTECT FROM LIGHT 84 tablet 0   mirabegron ER (MYRBETRIQ) 50 MG  TB24 tablet Take 50 mg by mouth daily.     Omega-3 Fatty Acids (FISH OIL) 1200 MG CAPS Take 1,200 mg by mouth daily.     ORENCIA CLICKJECT 125 MG/ML SOAJ INJECT ONE CLICKJECT PEN UNDER THE SKIN EVERY WEEK. REFRIGERATE. ALLOW TO WARM TO ROOM TEMPERATURE PRIOR TO ADMINISTRATION. 12 mL 0   oxyCODONE (OXY IR/ROXICODONE) 5 MG immediate release tablet Take 1-2 tablets (5-10 mg total) by mouth every 6 (six) hours as needed for severe pain. 42 tablet 0   VITAMIN D PO Take 1 capsule by mouth daily.     Vitamin E 450 MG (1000 UT) CAPS Take 450 mg by mouth daily.      Results for orders placed or performed during the hospital encounter of 10/31/22 (from the past 48 hour(s))  CBC per protocol     Status: Abnormal   Collection Time: 10/31/22  1:48 PM  Result Value Ref Range   WBC 9.3 4.0 - 10.5 K/uL   RBC 4.94 3.87 - 5.11 MIL/uL   Hemoglobin 12.7 12.0 - 15.0 g/dL   HCT 16.1 09.6 - 04.5 %   MCV 83.0 80.0 - 100.0 fL   MCH 25.7 (L) 26.0 - 34.0 pg   MCHC 31.0 30.0 - 36.0 g/dL   RDW 40.9 (H) 81.1 - 91.4 %   Platelets 457 (H) 150 - 400 K/uL   nRBC 0.0 0.0 - 0.2 %    Comment: Performed at [facility], [address], [place], [place] [address]  Surgical pcr screen     Status: None   Collection Time: 10/31/22  1:48 PM   Specimen: Nasal Mucosa; Nasal Swab  Result Value Ref Range   MRSA, PCR NEGATIVE NEGATIVE   Staphylococcus aureus NEGATIVE NEGATIVE    Comment: (NOTE) The Xpert SA Assay (FDA approved for NASAL specimens in patients 2 years of age and older), is one component of a comprehensive surveillance program. It is not intended to diagnose infection nor to guide or monitor treatment. Performed at [facility], [address], [place], [place] [address]    No results found.  Review of Systems  Blood pressure 128/76, pulse 85, temperature 98.4 F (36.9 C), resp. rate 20, last menstrual period 04/07/2012, SpO2 100%. Physical Exam Physical Examination:  General appearance - alert, well appearing, and in no distress Mental status - alert, oriented to person, place, and time Chest - clear to auscultation, no wheezes, rales or rhonchi, symmetric air entry Heart - normal rate, regular rhythm, normal S1, S2, no murmurs, rubs, clicks or gallops Abdomen - soft, nontender, nondistended, no masses or organomegaly Neurological - alert, oriented, normal speech, no focal findings or movement disorder noted Right knee 1 x 1 cm area of hypertrophic granulation tissue at inferior aspect of incision. No knee warmth or  effusion.No varus/valgus knee laxity   Assessment/Plan Right TKA instability with wound compromise- Plan irrigation and debridement with possible femoral revision if joint is not infected. Discussed all possibilities with the patient and she elects to proceed  [name] Gross, MD 11/01/2022, 7:09 AM

## 2022-11-01 NOTE — Anesthesia Procedure Notes (Signed)
Anesthesia Regional Block: Adductor canal block   Pre-Anesthetic Checklist: , timeout performed,  Correct Patient, Correct Site, Correct Laterality,  Correct Procedure, Correct Position, site marked,  Risks and benefits discussed,  Surgical consent,  Pre-op evaluation,  At surgeon's request and post-op pain management  Laterality: Right  Prep: chloraprep       Needles:  Injection technique: Single-shot  Needle Type: Echogenic Needle     Needle Length: 9cm  Needle Gauge: 21     Additional Needles:   Narrative:  Start time: 11/01/2022 7:20 AM End time: 11/01/2022 7:28 AM Injection made incrementally with aspirations every 5 mL.  Performed by: Personally  Anesthesiologist: Achille Rich, MD  Additional Notes: Pt tolerated the procedure well.

## 2022-11-01 NOTE — Anesthesia Procedure Notes (Signed)
Procedure Name: LMA Insertion Date/Time: 11/01/2022 7:47 AM  Performed by: Oletha Cruel, CRNAPre-anesthesia Checklist: Patient identified, Emergency Drugs available, Suction available, Patient being monitored and Timeout performed Patient Re-evaluated:Patient Re-evaluated prior to induction Oxygen Delivery Method: Circle system utilized Preoxygenation: Pre-oxygenation with 100% oxygen Induction Type: IV induction Ventilation: Mask ventilation without difficulty LMA: LMA inserted LMA Size: 4.0 Number of attempts: 1 Placement Confirmation: ETT inserted through vocal cords under direct vision, positive ETCO2 and breath sounds checked- equal and bilateral Tube secured with: Tape Dental Injury: Teeth and Oropharynx as per pre-operative assessment

## 2022-11-01 NOTE — Anesthesia Preprocedure Evaluation (Signed)
Anesthesia Evaluation  Patient identified by MRN, date of birth, ID band Patient awake    Reviewed: Allergy & Precautions, H&P , NPO status , Patient's Chart, lab work & pertinent test results  Airway Mallampati: II   Neck ROM: full    Dental   Pulmonary neg pulmonary ROS   breath sounds clear to auscultation       Cardiovascular negative cardio ROS  Rhythm:regular Rate:Normal     Neuro/Psych  PSYCHIATRIC DISORDERS  Depression       GI/Hepatic   Endo/Other    Renal/GU      Musculoskeletal  (+) Arthritis , Rheumatoid disorders,    Abdominal   Peds  Hematology   Anesthesia Other Findings   Reproductive/Obstetrics                             Anesthesia Physical Anesthesia Plan  ASA: 2  Anesthesia Plan: General   Post-op Pain Management: Regional block*   Induction: Intravenous  PONV Risk Score and Plan: 3 and Ondansetron, Dexamethasone, Midazolam and Treatment may vary due to age or medical condition  Airway Management Planned: LMA  Additional Equipment:   Intra-op Plan:   Post-operative Plan: Extubation in OR  Informed Consent: I have reviewed the patients History and Physical, chart, labs and discussed the procedure including the risks, benefits and alternatives for the proposed anesthesia with the patient or authorized representative who has indicated his/her understanding and acceptance.     Dental advisory given  Plan Discussed with: CRNA, Anesthesiologist and Surgeon  Anesthesia Plan Comments:        Anesthesia Quick Evaluation

## 2022-11-01 NOTE — Interval H&P Note (Signed)
History and Physical Interval Note:  11/01/2022 7:19 AM  [name] [name]  has presented today for surgery, with the diagnosis of right total knee infection.  The various methods of treatment have been discussed with the patient and family. After consideration of risks, benefits and other options for treatment, the patient has consented to  Procedure(s): Right knee irrigation and debridement; possible femoral revision; possible wound VAC (Right) TOTAL KNEE REVISION (Right) as a surgical intervention.  The patient's history has been reviewed, patient examined, no change in status, stable for surgery.  I have reviewed the patient's chart and labs.  Questions were answered to the patient's satisfaction.     [name]

## 2022-11-01 NOTE — Op Note (Unsigned)
NAME: [name] [name], [name] B. MEDICAL RECORD NO: [id number] ACCOUNT NO: [id number] DATE OF BIRTH: [birth year] FACILITY: [facility] LOCATION: [facility] PHYSICIAN: Gus Rankin. France Lusty, MD  Operative Report   DATE OF PROCEDURE: 11/01/2022  PREOPERATIVE DIAGNOSIS:  Unstable right total knee with wound compromise.  POSTOPERATIVE DIAGNOSIS:  Unstable right total knee with wound compromise.  PROCEDURE:  Right knee irrigation, debridement, polyethylene exchange and wound VAC placement.  SURGEON:  Gus Rankin. Twila Rappa, MD  ASSISTANT:  Arther Abbott, PA-C  ANESTHESIA:  General and adductor canal block.  ESTIMATED BLOOD LOSS:  20.  DRAIN:  Wound VAC.  COMPLICATIONS:  None.  CONDITION:  Stable to recovery.  TOURNIQUET TIME:  51 minutes at 300 mmHg.  BRIEF CLINICAL NOTE:  The patient is a 62 year old female with complex history with regards to her right knee.  She had a right total knee revision done about 4 months ago and in the first 2 weeks postop, had an episode where she had a marked increase in  pain.  She was noted to have a dislocation of the prosthesis at the tibiofemoral joint.  We performed a polyethylene revision and it was noted she also had a patellar tendon rupture and that was repaired.  She had been recovering well but had a small  area of hypertrophic granulation tissue at the inferior aspect of the incision with a stitch abscess.  The area was starting to heal well, but then 2 weeks ago, she had a marked increase in pain, was noted to have a subluxation of the tibia on the femur.   We initially opted to try and hold off on surgery until the wound healed, but did not see any progress in about a 2-week time span.  Thus, she presents to the operating room today for irrigation, debridement and polyethylene revision versus femoral  revision to a hinged implant  DESCRIPTION OF PROCEDURE:  After successful administration of adductor canal block and general anesthetic, tourniquet was placed on her  right thigh.  Right lower extremity prepped and draped in the usual sterile fashion.  Extremity was wrapped in  Esmarch, tourniquet inflated to 300 mmHg.  Midline incision was made with a 10 blade.  She had a 1 x 1 cm area of hypertrophic granulation tissue, inferomedial aspect of the knee.  I ellipsed that granulation tissue out.  There was suture at the base of  this and we removed the nonabsorbable sutures.  This tracked down to the base of the suture.  We then made a medial arthrotomy.  We sent fluid for stat Gram stain, culture and sensitivity.  There was minimal if any fluid in the joint.  Soft tissue over  the proximal medial tibia subperiosteally elevated to the joint line with a knife and into the semimembranosus bursa with a Cobb elevator.  We sent some synovial tissue also for culture.  I did a thorough synovectomy, which was really mostly cutting out  scar.  Fortunately, the patellar tendon repair was intact and that healed normally.  We were able to evert the patella and flex the knee 90 degrees.  The tibia had been subluxed from the femur with the bearing rotating posterolaterally.  We removed the  bearing, which was 18 mm in thickness.  We then thoroughly irrigated the knee with 3 liters of saline using pulsatile lavage.  We then used Prontosan to also cleanse the wound.  I used half the bottle, then used another liter and a half of saline with  pulsatile lavage and then  the other half bottle of Prontosan.  We then changed gloves and instruments and trialed a 20 mm insert.  With the 20, there was still a tiny bit of AP laxity. I went to 22, which had great stability throughout full range of  motion.  We placed the 22 mm Attune Revision mobile bearing posterior stabilized polyethylene.  With this in place, there was outstanding stability throughout full range of motion.  She lacked about 5 degrees from full extension, but otherwise had normal  motion and great stability throughout.  Further  irrigated with the other liter and a half of saline.  We then closed the arthrotomy with a running 0 Stratafix suture.  Subcutaneous was closed with interrupted 2-0 Vicryl.  There was about a quarter-sized  area that was opened at the inferomedial aspect just at the level of the skin and subcutaneous tissue.  The joint was closed deep.  I decided to place a wound VAC and after we had stapled the skin and cleaned and dried the incision, we then prepared for  placement of the wound VAC.  I placed a sponge along the incision and in this open area.  We then finished the dressing and hooked it up to suction and had an excellent seal.  We wrapped the knee in Webril and then Ace and then the patient is  subsequently awakened and transported to recovery in stable condition.  Note that a surgical assistant was of medical necessity for this procedure.   SHW D: 11/01/2022 9:31:47 am T: 11/01/2022 10:04:00 am  JOB: [id number]/ [id number]

## 2022-11-01 NOTE — Transfer of Care (Addendum)
Immediate Anesthesia Transfer of Care Note  Patient: [name] [name]  Procedure(s) Performed: Right knee irrigation and debridement; poly exchange; wound vac placement (Right: Knee)  Patient Location: PACU  Anesthesia Type:General  Level of Consciousness: drowsy and patient cooperative  Airway & Oxygen Therapy: Patient Spontanous Breathing and Patient connected to face mask oxygen  Post-op Assessment: Report given to RN and Post -op Vital signs reviewed and stable  Post vital signs: Reviewed and stable  Last Vitals:  Vitals Value Taken Time  BP 158/76 11/01/22   0924  Temp 36.4 11/01/22   0924  Pulse 112 11/01/22   0937  Resp 16 11/01/22   0924  SpO2 98% 11/01/22   0924    Last Pain: There were no vitals filed for this visit.       Complications: No notable events documented.

## 2022-11-01 NOTE — Plan of Care (Signed)

## 2022-11-01 NOTE — Brief Op Note (Signed)
11/01/2022  9:24 AM  PATIENT:  [name] [name]  62 y.o. female  PRE-OPERATIVE DIAGNOSIS:  Unstable right total knee   POST-OPERATIVE DIAGNOSIS:  Unstable right total knee   PROCEDURE:  Procedure(s): Right knee irrigation and debridement; poly exchange; wound vac placement (Right)  SURGEON:  Surgeons and Role:    Ollen Gross, MD - Primary  PHYSICIAN ASSISTANT:   ASSISTANTS: Arther Abbott, PA-C   ANESTHESIA:   regional and general  EBL:  20 mL   BLOOD ADMINISTERED:none  DRAINS:  Wound VAC    LOCAL MEDICATIONS USED:  NONE  COUNTS:  YES  TOURNIQUET:   Total Tourniquet Time Documented: Thigh (Right) - 51 minutes Total: Thigh (Right) - 51 minutes   DICTATION: .Other Dictation: Dictation Number [id number]  PLAN OF CARE: Admit to inpatient   PATIENT DISPOSITION:  PACU - hemodynamically stable.

## 2022-11-02 LAB — BASIC METABOLIC PANEL WITH GFR
Anion gap: 7 (ref 5–15)
BUN: 10 mg/dL (ref 6–20)
CO2: 24 mmol/L (ref 22–32)
Calcium: 8.1 mg/dL — ABNORMAL LOW (ref 8.9–10.3)
Chloride: 104 mmol/L (ref 98–111)
Creatinine, Ser: 0.58 mg/dL (ref 0.44–1.00)
GFR, Estimated: >60 mL/min (ref >60)
Glucose, Bld: 162 mg/dL — ABNORMAL HIGH (ref 70–99)
Potassium: 3.6 mmol/L (ref 3.5–5.1)
Sodium: 135 mmol/L (ref 135–145)

## 2022-11-02 LAB — CBC
HCT: 32.8 % — ABNORMAL LOW (ref 36.0–46.0)
Hemoglobin: 9.9 g/dL — ABNORMAL LOW (ref 12.0–15.0)
MCH: 25.1 pg — ABNORMAL LOW (ref 26.0–34.0)
MCHC: 30.2 g/dL (ref 30.0–36.0)
MCV: 83 fL (ref 80.0–100.0)
Platelets: 280 10*3/uL (ref 150–400)
RBC: 3.95 MIL/uL (ref 3.87–5.11)
RDW: 16.2 % — ABNORMAL HIGH (ref 11.5–15.5)
WBC: 10 10*3/uL (ref 4.0–10.5)
nRBC: 0 % (ref 0.0–0.2)

## 2022-11-02 MED ORDER — CARMEX CLASSIC LIP BALM EX OINT
TOPICAL_OINTMENT | CUTANEOUS | Status: DC | PRN
  Administered 2022-11-02: 1 via TOPICAL
  Filled 2022-11-02: qty 10

## 2022-11-02 NOTE — Progress Notes (Signed)
Physical Therapy Treatment Patient Details Name: [name] [name] MRN: [id number] DOB: 27-Sep-1960 Today's Date: 11/02/2022   History of Present Illness Pt admitted from home and now s/p R TKR I&D with poly exchange and wound vac placement.  Pt with hx of RA, cervical fusion, and bil TKR with bil revisions including R patellar tendon repair 07/09/22    PT Comments  Pt very cooperative and with marked improvement in activity tolerance as noted with distance ambulated in hall.  Pt hopes to progress to dc home with [facility] services and assist of family but does not have full 24/7 assist.     Assistance Recommended at Discharge Frequent or constant Supervision/Assistance  If plan is discharge home, recommend the following:  Can travel by private vehicle    A little help with walking and/or transfers;A little help with bathing/dressing/bathroom;Assistance with cooking/housework;Assist for transportation;Help with stairs or ramp for entrance      Equipment Recommendations  None recommended by PT    Recommendations for Other Services       Precautions / Restrictions Precautions Precautions: Fall Restrictions Weight Bearing Restrictions: No     Mobility  Bed Mobility Overal bed mobility: Needs Assistance Bed Mobility: Supine to Sit     Supine to sit: Min assist     General bed mobility comments: Pt up in chair and requests back to same    Transfers Overall transfer level: Needs assistance Equipment used: Rolling walker (2 wheels) Transfers: Sit to/from Stand Sit to Stand: Min assist           General transfer comment: cues for LE management and use of UEs to self assist    Ambulation/Gait Ambulation/Gait assistance: Min assist, Min guard Gait Distance (Feet): 100 Feet (twice) Assistive device: Bilateral platform walker Gait Pattern/deviations: Step-to pattern, Decreased step length - right, Decreased step length - left, Shuffle, Trunk flexed       General Gait  Details: cues for sequence, posture and position from [employer]             Wheelchair Mobility     Tilt Bed    Modified Rankin (Stroke Patients Only)       Balance Overall balance assessment: Needs assistance Sitting-balance support: No upper extremity supported, Feet supported Sitting balance-Leahy Scale: Good     Standing balance support: Bilateral upper extremity supported Standing balance-Leahy Scale: Poor                              Cognition Arousal/Alertness: Awake/alert Behavior During Therapy: WFL for tasks assessed/performed Overall Cognitive Status: Within Functional Limits for tasks assessed                                          Exercises Total Joint Exercises Ankle Circles/Pumps: AROM, Both, 15 reps, Supine Quad Sets: AROM, Both, 10 reps, Supine Heel Slides: AAROM, Right, Supine, 15 reps Hip ABduction/ADduction: AAROM, Right, 10 reps, Supine    General Comments        Pertinent Vitals/Pain Pain Assessment Pain Assessment: 0-10 Pain Score: 3  Pain Location: R knee Pain Descriptors / Indicators: Aching, Sore Pain Intervention(s): Limited activity within patient's tolerance, Monitored during session, Premedicated before session, Ice applied    Home Living  Prior Function            PT Goals (current goals can now be found in the care plan section) Acute Rehab PT Goals Patient Stated Goal: HOME and not SNF PT Goal Formulation: With patient Time For Goal Achievement: 11/16/22 Potential to Achieve Goals: Fair Progress towards PT goals: Progressing toward goals    Frequency    7X/week      PT Plan Current plan remains appropriate    Co-evaluation              AM-PAC PT "6 Clicks" Mobility   Outcome Measure  Help needed turning from your back to your side while in a flat bed without using bedrails?: A Lot Help needed moving from lying on your back to  sitting on the side of a flat bed without using bedrails?: A Little Help needed moving to and from a bed to a chair (including a wheelchair)?: A Little Help needed standing up from a chair using your arms (e.g., wheelchair or bedside chair)?: A Little Help needed to walk in hospital room?: A Little Help needed climbing 3-5 steps with a railing? : Total 6 Click Score: 15    End of Session Equipment Utilized During Treatment: Gait belt Activity Tolerance: Patient tolerated treatment well;Patient limited by fatigue Patient left: in chair;with call bell/phone within reach;with chair alarm set Nurse Communication: Mobility status PT Visit Diagnosis: Unsteadiness on feet (R26.81);Difficulty in walking, not elsewhere classified (R26.2);Pain Pain - Right/Left: Right Pain - part of body: Knee     Time: 5784-6962 PT Time Calculation (min) (ACUTE ONLY): 40 min  Charges:    $Gait Training: 23-37 mins $Therapeutic Activity: 8-22 mins PT General Charges $$ ACUTE PT VISIT: 1 Visit                     [name] PT Acute Rehabilitation Services Pager [phone] Office [phone]    Taneeka Curtner 11/02/2022, 4:32 PM

## 2022-11-02 NOTE — Evaluation (Signed)
Physical Therapy Evaluation Patient Details Name: [name] [name] MRN: [id number] DOB: 1960-08-02 Today's Date: 11/02/2022  History of Present Illness  Pt admitted from home and now s/p R TKR I&D with poly exchange and wound vac placement.  Pt with hx of RA, cervical fusion, and bil TKR with bil revisions including R patellar tendon repair 07/09/22  Clinical Impression  Pt admitted as above and presenting with functional mobility limitations 2* decreased R LE strength/ROM, post op pain, and general deconditioning.  Pt very cooperative and up to ambulate limited distance in hall and very motivated to progress to dc home rather than SNF.      Assistance Recommended at Discharge Frequent or constant Supervision/Assistance  If plan is discharge home, recommend the following:  Can travel by private vehicle  A little help with walking and/or transfers;A little help with bathing/dressing/bathroom;Assistance with cooking/housework;Assist for transportation;Help with stairs or ramp for entrance        Equipment Recommendations None recommended by PT  Recommendations for Other Services       Functional Status Assessment Patient has had a recent decline in their functional status and demonstrates the ability to make significant improvements in function in a reasonable and predictable amount of time.     Precautions / Restrictions Precautions Precautions: Fall Restrictions Weight Bearing Restrictions: No      Mobility  Bed Mobility Overal bed mobility: Needs Assistance Bed Mobility: Supine to Sit     Supine to sit: Min assist     General bed mobility comments: Increased time with assist for R LE    Transfers Overall transfer level: Needs assistance Equipment used: Rolling walker (2 wheels) Transfers: Sit to/from Stand Sit to Stand: Min assist, From elevated surface           General transfer comment: cues for LE management and use of UEs to self assist     Ambulation/Gait Ambulation/Gait assistance: Min assist Gait Distance (Feet): 26 Feet Assistive device: Bilateral platform walker Gait Pattern/deviations: Step-to pattern, Decreased step length - right, Decreased step length - left, Shuffle, Trunk flexed       General Gait Details: cues for sequence, posture and position from [employer]            Wheelchair Mobility     Tilt Bed    Modified Rankin (Stroke Patients Only)       Balance Overall balance assessment: Needs assistance Sitting-balance support: No upper extremity supported, Feet supported Sitting balance-Leahy Scale: Good     Standing balance support: Bilateral upper extremity supported Standing balance-Leahy Scale: Poor                               Pertinent Vitals/Pain Pain Assessment Pain Assessment: 0-10 Pain Score: 4  Pain Location: R knee Pain Descriptors / Indicators: Aching, Sore Pain Intervention(s): Limited activity within patient's tolerance, Monitored during session, Premedicated before session, Ice applied    Home Living Family/patient expects to be discharged to:: Unsure Living Arrangements: Alone Available Help at Discharge: Available PRN/intermittently (son is main support but works 12 hr shifts) Type of Home: House Home Access: Stairs to enter   Secretary/administrator of Steps: 1+1+1 Alternate Level Stairs-Number of Steps: has stair lift Home Layout: Two level;1/2 bath on main level Home Equipment: Pharmacist, hospital (2 wheels);Cane - single point;Crutches Additional Comments: Pt hopeful to progress to return home    Prior Function Prior Level of Function : Needs  assist             Mobility Comments: ambulating household distances with RW ADLs Comments: Independent with ADLs and IADLs     Hand Dominance        Extremity/Trunk Assessment   Upper Extremity Assessment Upper Extremity Assessment: Overall WFL for tasks assessed    Lower  Extremity Assessment Lower Extremity Assessment: RLE deficits/detail RLE Deficits / Details: 3-/5 quads with AAROM at knee -8 - 40       Communication   Communication: No difficulties  Cognition Arousal/Alertness: Awake/alert Behavior During Therapy: WFL for tasks assessed/performed Overall Cognitive Status: Within Functional Limits for tasks assessed                                          General Comments      Exercises Total Joint Exercises Ankle Circles/Pumps: AROM, Both, 15 reps, Supine Quad Sets: AROM, Both, 10 reps, Supine Heel Slides: AAROM, Right, Supine, 15 reps Hip ABduction/ADduction: AAROM, Right, 10 reps, Supine   Assessment/Plan    PT Assessment Patient needs continued PT services  PT Problem List Decreased strength;Decreased range of motion;Decreased activity tolerance;Decreased balance;Decreased mobility;Decreased knowledge of use of DME;Pain       PT Treatment Interventions DME instruction;Gait training;Stair training;Functional mobility training;Therapeutic activities;Therapeutic exercise;Patient/family education;Balance training    PT Goals (Current goals can be found in the Care Plan section)  Acute Rehab PT Goals Patient Stated Goal: HOME and not SNF PT Goal Formulation: With patient Time For Goal Achievement: 11/16/22 Potential to Achieve Goals: Fair    Frequency 7X/week     Co-evaluation               AM-PAC PT "6 Clicks" Mobility  Outcome Measure Help needed turning from your back to your side while in a flat bed without using bedrails?: A Lot Help needed moving from lying on your back to sitting on the side of a flat bed without using bedrails?: A Little Help needed moving to and from a bed to a chair (including a wheelchair)?: A Lot Help needed standing up from a chair using your arms (e.g., wheelchair or bedside chair)?: A Little Help needed to walk in hospital room?: A Little Help needed climbing 3-5 steps with  a railing? : Total 6 Click Score: 14    End of Session Equipment Utilized During Treatment: Gait belt Activity Tolerance: Patient tolerated treatment well;Patient limited by fatigue Patient left: in chair;with call bell/phone within reach;with chair alarm set Nurse Communication: Mobility status PT Visit Diagnosis: Unsteadiness on feet (R26.81);Difficulty in walking, not elsewhere classified (R26.2);Pain Pain - Right/Left: Right Pain - part of body: Knee    Time: 6295-2841 PT Time Calculation (min) (ACUTE ONLY): 43 min   Charges:   PT Evaluation $PT Eval Low Complexity: 1 Low PT Treatments $Gait Training: 8-22 mins $Therapeutic Exercise: 8-22 mins PT General Charges $$ ACUTE PT VISIT: 1 Visit         [name] PT Acute Rehabilitation Services Pager [phone] Office [phone]   Abdulkareem Badolato 11/02/2022, 12:10 PM

## 2022-11-02 NOTE — Progress Notes (Signed)
   Subjective: 1 Day Post-Op Procedure(s) (LRB): Right knee irrigation and debridement; poly exchange; wound vac placement (Right)  Pt c/o mild pain/ache this morning Cultures pending but gram stain was negative Apprehensive about physical therapy Denies any new symptoms/issues overnight Patient reports pain as mild.  Objective:   VITALS:   Vitals:   11/02/22 0207 11/02/22 0522  BP: 110/65 110/61  Pulse: 82 77  Resp: 16 17  Temp:  98.3 F (36.8 C)  SpO2: 97% 96%    Right knee dressing and wound vac in place Nv intact distally No rashes or edema distally   LABS Recent Labs    10/31/22 1348 11/02/22 0329  HGB 12.7 9.9*  HCT 41.0 32.8*  WBC 9.3 10.0  PLT 457* 280    Recent Labs    11/01/22 0700 11/02/22 0329  NA 137 135  K 3.7 3.6  BUN 9 10  CREATININE 0.54 0.58  GLUCOSE 93 162*     Assessment/Plan: 1 Day Post-Op Procedure(s) (LRB): Right knee irrigation and debridement; poly exchange; wound vac placement (Right) PT/OT as able Pt mentioned [facility] vs home for recovery Pulmonary toilet Pain management as needed    Elizebeth Koller, MPAS [facility] [employer] [facility] now [facility] [address], [place], [place] [address] Phone: [phone] [email or web address] Facebook  [employer]

## 2022-11-02 NOTE — Plan of Care (Signed)
  Problem: Education: Goal: Knowledge of the prescribed therapeutic regimen will improve Outcome: Progressing Goal: Individualized Educational Video(s) Outcome: Completed/Met   Problem: Activity: Goal: Ability to avoid complications of mobility impairment will improve Outcome: Progressing Goal: Range of joint motion will improve Outcome: Progressing   Problem: Clinical Measurements: Goal: Postoperative complications will be avoided or minimized Outcome: Progressing   Problem: Skin Integrity: Goal: Will show signs of wound healing Outcome: Progressing   Problem: Education: Goal: Knowledge of General Education information will improve Description: Including pain rating scale, medication(s)/side effects and non-pharmacologic comfort measures Outcome: Progressing   Problem: Health Behavior/Discharge Planning: Goal: Ability to manage health-related needs will improve Outcome: Progressing   Problem: Clinical Measurements: Goal: Ability to maintain clinical measurements within normal limits will improve Outcome: Progressing Goal: Will remain free from infection Outcome: Progressing Goal: Diagnostic test results will improve Outcome: Progressing Goal: Respiratory complications will improve Outcome: Progressing Goal: Cardiovascular complication will be avoided Outcome: Progressing   Problem: Activity: Goal: Risk for activity intolerance will decrease Outcome: Progressing   Problem: Nutrition: Goal: Adequate nutrition will be maintained Outcome: Adequate for Discharge   Problem: Coping: Goal: Level of anxiety will decrease Outcome: Progressing   Problem: Elimination: Goal: Will not experience complications related to bowel motility Outcome: Progressing Goal: Will not experience complications related to urinary retention Outcome: Completed/Met   Problem: Pain Managment: Goal: General experience of comfort will improve Outcome: Progressing   Problem: Safety: Goal:  Ability to remain free from injury will improve Outcome: Progressing   Problem: Skin Integrity: Goal: Risk for impaired skin integrity will decrease Outcome: Progressing

## 2022-11-03 MED ORDER — SODIUM CHLORIDE 0.9 % IV SOLN
2.0000 g | Freq: Three times a day (TID) | INTRAVENOUS | Status: DC
  Administered 2022-11-03 – 2022-11-05 (×6): 2 g via INTRAVENOUS
  Filled 2022-11-03 (×6): qty 12.5

## 2022-11-03 NOTE — Progress Notes (Signed)
PT TX NOTE  11/03/22 1400  PT Visit Information  Last PT Received On 11/03/22  Assistance Needed +1  Pt is progressing toward goals, d/c plan remains appriopriate. Patient will benefit from continued inpatient follow up therapy, <3 hours/day   History of Present Illness Patient is a 62 year old female who was admitted from home and now s/p R TKR I&D with poly exchange and wound vac placement.  Pt with hx of RA, cervical fusion, and bil TKR with bil revisions including R patellar tendon repair 07/09/22  Subjective Data  Patient Stated Goal HOME and not SNF  Precautions  Precautions Fall;Knee  Precaution Comments wound vac RLE  Restrictions  Weight Bearing Restrictions No  Other Position/Activity Restrictions WBAT  Pain Assessment  Pain Assessment 0-10  Pain Score 3  Pain Location R knee  Pain Descriptors / Indicators Aching;Sore  Pain Intervention(s) Limited activity within patient's tolerance;Monitored during session;Premedicated before session;Repositioned;Ice applied  Cognition  Arousal/Alertness Awake/alert  Behavior During Therapy WFL for tasks assessed/performed  Overall Cognitive Status Within Functional Limits for tasks assessed  Bed Mobility  Overal bed mobility Needs Assistance  Bed Mobility Sit to Supine  Sit to supine Min assist  General bed mobility comments assist with LEs on to bed and positioning  Transfers  Overall transfer level Needs assistance  Equipment used Rolling walker (2 wheels)  Transfers Sit to/from Stand;Bed to chair/wheelchair/BSC  Sit to Stand Min guard  Bed to/from chair/wheelchair/BSC transfer type: Step pivot  Step pivot transfers Min assist;Min guard  General transfer comment cues for LE management and use of UEs to self assist  Balance  Sitting-balance support No upper extremity supported;Feet supported  Sitting balance-Leahy Scale Good  Standing balance support During functional activity;Reliant on assistive device for balance  Standing  balance-Leahy Scale Fair  Total Joint Exercises  Ankle Circles/Pumps AROM;Both;10 reps  Quad Sets AROM;Both;10 reps  Heel Slides AAROM;Right;10 reps  Straight Leg Raises AAROM;Right;Strengthening;10 reps;Limitations  Straight Leg Raises Limitations ~15 degree quad lag  PT - End of Session  Equipment Utilized During Treatment Gait belt  Activity Tolerance Patient tolerated treatment well  Patient left with call bell/phone within reach;in bed;with bed alarm set   PT - Assessment/Plan  PT Plan Current plan remains appropriate  PT Visit Diagnosis Unsteadiness on feet (R26.81);Difficulty in walking, not elsewhere classified (R26.2);Pain  Pain - Right/Left Right  Pain - part of body Knee  PT Frequency (ACUTE ONLY) 7X/week  Follow Up Recommendations Skilled nursing-short term rehab (<3 hours/day) (HHPT vs SNF)  Can patient physically be transported by private vehicle Yes  Assistance recommended at discharge Frequent or constant Supervision/Assistance  Patient can return home with the following A little help with walking and/or transfers;A little help with bathing/dressing/bathroom;Assistance with cooking/housework;Assist for transportation;Help with stairs or ramp for entrance  PT equipment None recommended by PT  AM-PAC PT "6 Clicks" Mobility Outcome Measure (Version 2)  Help needed turning from your back to your side while in a flat bed without using bedrails? 3  Help needed moving from lying on your back to sitting on the side of a flat bed without using bedrails? 3  Help needed moving to and from a bed to a chair (including a wheelchair)? 3  Help needed standing up from a chair using your arms (e.g., wheelchair or bedside chair)? 3  Help needed to walk in hospital room? 3  Help needed climbing 3-5 steps with a railing?  2  6 Click Score 17  Consider Recommendation of  Discharge To: Home with [facility]  PT Goal Progression  Progress towards PT goals Progressing toward goals  Acute Rehab PT  Goals  PT Goal Formulation With patient  Time For Goal Achievement 11/16/22  Potential to Achieve Goals Good  PT Time Calculation  PT Start Time (ACUTE ONLY) 1349  PT Stop Time (ACUTE ONLY) 1419  PT Time Calculation (min) (ACUTE ONLY) 30 min  PT General Charges  $$ ACUTE PT VISIT 1 Visit  PT Treatments  $Therapeutic Exercise 8-22 mins  $Therapeutic Activity 8-22 mins

## 2022-11-03 NOTE — NC FL2 (Deleted)
[place] MEDICAID FL2 LEVEL OF CARE FORM     IDENTIFICATION  Patient Name: [name] [name] Birthdate: 1960/09/03 Sex: female Admission Date (Current Location): 11/01/2022  [facility] and [place] Number:  Producer, television/film/video and Address:  [facility],  [address] [place], [place] [address]      Provider Number: [id number]  Attending Physician Name and Address:  Ollen Gross, MD  Relative Name and Phone Number:  Jeanann Lewandowsky (cousin) Ph: [phone]    Current Level of Care: Hospital Recommended Level of Care: Skilled Nursing Facility Prior Approval Number:    Date Approved/Denied:   PASRR Number: [id number]  Discharge Plan: SNF    Current Diagnoses: Patient Active Problem List   Diagnosis Date Noted   History of total knee arthroplasty, right 11/01/2022   Painful total knee replacement, right (HCC) 07/08/2022   Failed total knee arthroplasty (HCC) 12/04/2021   Failed total left knee replacement (HCC) 12/04/2021   Status post total left knee replacement 02/28/2019   Chronic pain of left knee 02/28/2019   Status post total knee replacement, bilateral 12/01/2016   Status post bilateral foot surgery 12/01/2016   Contracture of right elbow 06/24/2016   Contracture of left elbow 06/24/2016   Rheumatoid arthritis involving multiple sites with positive rheumatoid factor (HCC) 05/27/2016   ANA positive 05/27/2016   Lateral epicondylitis, left elbow 05/27/2016   Trochanteric bursitis of left hip 05/27/2016   High risk medication use 05/27/2016   DJD (degenerative joint disease), cervical 05/27/2016   Osteopenia of neck of left femur 05/27/2016   Primary osteoarthritis of both feet 05/27/2016   S/P TAH-BSO (5/30) 09/04/2012    Orientation RESPIRATION BLADDER Height & Weight     Self, Time, Situation, Place  Normal Continent Weight: 162 lb 14.7 oz (73.9 kg) Height:  5\' 2"  (157.5 cm)  BEHAVIORAL SYMPTOMS/MOOD NEUROLOGICAL BOWEL NUTRITION STATUS   (N/A)   (N/A) Continent Diet (Regular diet)  AMBULATORY STATUS COMMUNICATION OF NEEDS Skin   Limited Assist Verbally Surgical wounds (Currently has wound vac.)                       Personal Care Assistance Level of Assistance  Bathing, Feeding, Dressing Bathing Assistance: Limited assistance Feeding assistance: Independent Dressing Assistance: Limited assistance     Functional Limitations Info  Sight, Hearing, Speech Sight Info: Impaired Hearing Info: Adequate Speech Info: Adequate    SPECIAL CARE FACTORS FREQUENCY  PT (By licensed PT), OT (By licensed OT)     PT Frequency: 5x's/week OT Frequency: 5x's/week            Contractures Contractures Info: Not present    Additional Factors Info  Code Status, Allergies, Psychotropic Code Status Info: Full Allergies Info: Sulfa Antibiotics, Morphine, Penicillins Psychotropic Info: Celexa         Current Medications (11/03/2022):  This is the current hospital active medication list Current Facility-Administered Medications  Medication Dose Route Frequency Provider Last Rate Last Admin   0.9 %  sodium chloride infusion   Intravenous Continuous Edmisten, Kristie L, PA   Stopped at 11/02/22 1426   aspirin chewable tablet 81 mg  81 mg Oral BID Edmisten, Kristie L, PA   81 mg at 11/03/22 1014   bisacodyl (DULCOLAX) suppository 10 mg  10 mg Rectal Daily PRN Edmisten, Kristie L, PA       ceFAZolin (ANCEF) IVPB 2g/100 mL premix  2 g Intravenous Q8H Edmisten, Kristie L, PA 200 mL/hr at 11/03/22 0504 2  g at 11/03/22 0504   citalopram (CELEXA) tablet 20 mg  20 mg Oral Daily Edmisten, Kristie L, PA   20 mg at 11/03/22 1014   diphenhydrAMINE (BENADRYL) 12.5 MG/5ML elixir 12.5-25 mg  12.5-25 mg Oral Q4H PRN Edmisten, Kristie L, PA       docusate sodium (COLACE) capsule 100 mg  100 mg Oral BID Edmisten, Kristie L, PA   100 mg at 11/03/22 1014   HYDROmorphone (DILAUDID) injection 0.5-1 mg  0.5-1 mg Intravenous Q4H PRN Edmisten, Kristie L, PA    0.5 mg at 11/01/22 1001   lip balm (CARMEX) ointment   Topical PRN Ollen Gross, MD   1 Application at 11/02/22 0616   menthol-cetylpyridinium (CEPACOL) lozenge 3 mg  1 lozenge Oral PRN Edmisten, Kristie L, PA       Or   phenol (CHLORASEPTIC) mouth spray 1 spray  1 spray Mouth/Throat PRN Edmisten, Kristie L, PA       methocarbamol (ROBAXIN) tablet 500 mg  500 mg Oral Q6H PRN Edmisten, Kristie L, PA   500 mg at 11/03/22 1125   Or   methocarbamol (ROBAXIN) 500 mg in dextrose 5 % 50 mL IVPB  500 mg Intravenous Q6H PRN Edmisten, Kristie L, PA 110 mL/hr at 11/02/22 1643 Infusion Verify at 11/02/22 1643   metoCLOPramide (REGLAN) tablet 5-10 mg  5-10 mg Oral Q8H PRN Edmisten, Kristie L, PA       Or   metoCLOPramide (REGLAN) injection 5-10 mg  5-10 mg Intravenous Q8H PRN Edmisten, Kristie L, PA       mirabegron ER (MYRBETRIQ) tablet 50 mg  50 mg Oral Daily Edmisten, Kristie L, PA   50 mg at 11/03/22 1017   ondansetron (ZOFRAN) tablet 4 mg  4 mg Oral Q6H PRN Edmisten, Kristie L, PA       Or   ondansetron (ZOFRAN) injection 4 mg  4 mg Intravenous Q6H PRN Edmisten, Kristie L, PA       oxyCODONE (Oxy IR/ROXICODONE) immediate release tablet 10-15 mg  10-15 mg Oral Q4H PRN Edmisten, Kristie L, PA   10 mg at 11/01/22 2155   oxyCODONE (Oxy IR/ROXICODONE) immediate release tablet 5-10 mg  5-10 mg Oral Q4H PRN Edmisten, Kristie L, PA   10 mg at 11/03/22 1014   polyethylene glycol (MIRALAX / GLYCOLAX) packet 17 g  17 g Oral Daily PRN Edmisten, Kristie L, PA       sodium phosphate (FLEET) 7-19 GM/118ML enema 1 enema  1 enema Rectal Once PRN Edmisten, Lyn Hollingshead, PA         Discharge Medications: Please see discharge summary for a list of discharge medications.  Relevant Imaging Results:  Relevant Lab Results:   Additional Information SSN: [id number]  Ewing Schlein, LCSW

## 2022-11-03 NOTE — NC FL2 (Signed)
[place] MEDICAID FL2 LEVEL OF CARE FORM     IDENTIFICATION  Patient Name: [name] [name] Birthdate: [birth year] Sex: female Admission Date (Current Location): 11/01/2022  [facility] and [place] Number:  Producer, television/film/video and Address:  [facility],  [address] [place], [place] [address]      Provider Number: [id number]  Attending Physician Name and Address:  Ollen Gross, MD  Relative Name and Phone Number:  Jeanann Lewandowsky (cousin) Ph: [phone]    Current Level of Care: Hospital Recommended Level of Care: Skilled Nursing Facility Prior Approval Number:    Date Approved/Denied:   PASRR Number: [id number]  Discharge Plan: SNF    Current Diagnoses: Patient Active Problem List   Diagnosis Date Noted   History of total knee arthroplasty, right 11/01/2022   Painful total knee replacement, right (HCC) 07/08/2022   Failed total knee arthroplasty (HCC) 12/04/2021   Failed total left knee replacement (HCC) 12/04/2021   Status post total left knee replacement 02/28/2019   Chronic pain of left knee 02/28/2019   Status post total knee replacement, bilateral 12/01/2016   Status post bilateral foot surgery 12/01/2016   Contracture of right elbow 06/24/2016   Contracture of left elbow 06/24/2016   Rheumatoid arthritis involving multiple sites with positive rheumatoid factor (HCC) 05/27/2016   ANA positive 05/27/2016   Lateral epicondylitis, left elbow 05/27/2016   Trochanteric bursitis of left hip 05/27/2016   High risk medication use 05/27/2016   DJD (degenerative joint disease), cervical 05/27/2016   Osteopenia of neck of left femur 05/27/2016   Primary osteoarthritis of both feet 05/27/2016   S/P TAH-BSO (5/30) 09/04/2012    Orientation RESPIRATION BLADDER Height & Weight     Self, Time, Situation, Place  Normal Continent Weight: 162 lb 14.7 oz (73.9 kg) Height:  5\' 2"  (157.5 cm)  BEHAVIORAL SYMPTOMS/MOOD NEUROLOGICAL BOWEL NUTRITION STATUS   (N/A)   (N/A) Continent Diet (Regular diet)  AMBULATORY STATUS COMMUNICATION OF NEEDS Skin   Limited Assist Verbally Surgical wounds (Currently has wound vac.)                       Personal Care Assistance Level of Assistance  Bathing, Feeding, Dressing Bathing Assistance: Limited assistance Feeding assistance: Independent Dressing Assistance: Limited assistance     Functional Limitations Info  Sight, Hearing, Speech Sight Info: Impaired Hearing Info: Adequate Speech Info: Adequate    SPECIAL CARE FACTORS FREQUENCY  PT (By licensed PT), OT (By licensed OT)     PT Frequency: 5x's/week OT Frequency: 5x's/week            Contractures Contractures Info: Not present    Additional Factors Info  Code Status, Allergies, Psychotropic Code Status Info: Full Allergies Info: Sulfa Antibiotics, Morphine, Penicillins Psychotropic Info: Celexa         Current Medications (11/03/2022):  This is the current hospital active medication list Current Facility-Administered Medications  Medication Dose Route Frequency Provider Last Rate Last Admin   0.9 %  sodium chloride infusion   Intravenous Continuous Edmisten, Kristie L, PA   Stopped at 11/02/22 1426   aspirin chewable tablet 81 mg  81 mg Oral BID Edmisten, Kristie L, PA   81 mg at 11/03/22 1014   bisacodyl (DULCOLAX) suppository 10 mg  10 mg Rectal Daily PRN Edmisten, Kristie L, PA       ceFAZolin (ANCEF) IVPB 2g/100 mL premix  2 g Intravenous Q8H Edmisten, Kristie L, PA 200 mL/hr at 11/03/22 0504 2  g at 11/03/22 0504   citalopram (CELEXA) tablet 20 mg  20 mg Oral Daily Edmisten, Kristie L, PA   20 mg at 11/03/22 1014   diphenhydrAMINE (BENADRYL) 12.5 MG/5ML elixir 12.5-25 mg  12.5-25 mg Oral Q4H PRN Edmisten, Kristie L, PA       docusate sodium (COLACE) capsule 100 mg  100 mg Oral BID Edmisten, Kristie L, PA   100 mg at 11/03/22 1014   HYDROmorphone (DILAUDID) injection 0.5-1 mg  0.5-1 mg Intravenous Q4H PRN Edmisten, Kristie L, PA    0.5 mg at 11/01/22 1001   lip balm (CARMEX) ointment   Topical PRN Ollen Gross, MD   1 Application at 11/02/22 0616   menthol-cetylpyridinium (CEPACOL) lozenge 3 mg  1 lozenge Oral PRN Edmisten, Kristie L, PA       Or   phenol (CHLORASEPTIC) mouth spray 1 spray  1 spray Mouth/Throat PRN Edmisten, Kristie L, PA       methocarbamol (ROBAXIN) tablet 500 mg  500 mg Oral Q6H PRN Edmisten, Kristie L, PA   500 mg at 11/03/22 1125   Or   methocarbamol (ROBAXIN) 500 mg in dextrose 5 % 50 mL IVPB  500 mg Intravenous Q6H PRN Edmisten, Kristie L, PA 110 mL/hr at 11/02/22 1643 Infusion Verify at 11/02/22 1643   metoCLOPramide (REGLAN) tablet 5-10 mg  5-10 mg Oral Q8H PRN Edmisten, Kristie L, PA       Or   metoCLOPramide (REGLAN) injection 5-10 mg  5-10 mg Intravenous Q8H PRN Edmisten, Kristie L, PA       mirabegron ER (MYRBETRIQ) tablet 50 mg  50 mg Oral Daily Edmisten, Kristie L, PA   50 mg at 11/03/22 1017   ondansetron (ZOFRAN) tablet 4 mg  4 mg Oral Q6H PRN Edmisten, Kristie L, PA       Or   ondansetron (ZOFRAN) injection 4 mg  4 mg Intravenous Q6H PRN Edmisten, Kristie L, PA       oxyCODONE (Oxy IR/ROXICODONE) immediate release tablet 10-15 mg  10-15 mg Oral Q4H PRN Edmisten, Kristie L, PA   10 mg at 11/01/22 2155   oxyCODONE (Oxy IR/ROXICODONE) immediate release tablet 5-10 mg  5-10 mg Oral Q4H PRN Edmisten, Kristie L, PA   10 mg at 11/03/22 1014   polyethylene glycol (MIRALAX / GLYCOLAX) packet 17 g  17 g Oral Daily PRN Edmisten, Kristie L, PA       sodium phosphate (FLEET) 7-19 GM/118ML enema 1 enema  1 enema Rectal Once PRN Edmisten, Lyn Hollingshead, PA         Discharge Medications: Please see discharge summary for a list of discharge medications.  Relevant Imaging Results:  Relevant Lab Results:   Additional Information SSN: [id number]. Patient will likely need IV antibiotics.  Ewing Schlein, LCSW

## 2022-11-03 NOTE — Progress Notes (Signed)
   Subjective: 2 Days Post-Op Procedure(s) (LRB): Right knee irrigation and debridement; poly exchange; wound vac placement (Right) Patient reports pain as mild.   Plan is to go  home vs rehab  after hospital stay.  Objective: Vital signs in last 24 hours: Temp:  [97.9 F (36.6 C)-98.1 F (36.7 C)] 98.1 F (36.7 C) (07/29 0508) Pulse Rate:  [74-84] 74 (07/29 0508) Resp:  [16-17] 17 (07/29 0508) BP: (110-122)/(63-71) 119/65 (07/29 0508) SpO2:  [92 %-96 %] 92 % (07/29 0508)  Intake/Output from previous day:  Intake/Output Summary (Last 24 hours) at 11/03/2022 0712 Last data filed at 11/03/2022 0600 Gross per 24 hour  Intake 1494.82 ml  Output 2330 ml  Net -835.18 ml    Intake/Output this shift: No intake/output data recorded.  Labs: Recent Labs    10/31/22 1348 11/02/22 0329 11/03/22 0303  HGB 12.7 9.9* 9.9*   Recent Labs    11/02/22 0329 11/03/22 0303  WBC 10.0 11.3*  RBC 3.95 3.90  HCT 32.8* 32.2*  PLT 280 358   Recent Labs    11/01/22 0700 11/02/22 0329  NA 137 135  K 3.7 3.6  CL 102 104  CO2 27 24  BUN 9 10  CREATININE 0.54 0.58  GLUCOSE 93 162*  CALCIUM 9.2 8.1*   No results for input(s): "LABPT", "INR" in the last 72 hours.  EXAM General - Patient is Alert, Appropriate, and Oriented Extremity - Neurologically intact Neurovascular intact No cellulitis present Compartment soft VAC dressing in place and functioning well Dressing/Incision - clean, dry Motor Function - intact, moving foot and toes well on exam.    Past Medical History:  Diagnosis Date   Complication of anesthesia    "irregular heartbeat" during neck surgery   Depression    Fibroid    Neuromuscular disorder (HCC)    Pre-diabetes    Rheumatoid arthritis (HCC)    Urinary incontinence     Assessment/Plan: 2 Days Post-Op Procedure(s) (LRB): Right knee irrigation and debridement; poly exchange; wound vac placement (Right) Principal Problem:   Failed total knee  arthroplasty (HCC) Active Problems:   History of total knee arthroplasty, right   Advance diet Up with therapy Await final culture results prior to making decision on antibiotics/PICC line    Benzion Mesta 11/03/2022, 7:12 AM

## 2022-11-03 NOTE — Progress Notes (Signed)
Physical Therapy Treatment Patient Details Name: [name] [name] MRN: [id number] DOB: 07-08-1960 Today's Date: 11/03/2022   History of Present Illness Patient is a 62 year old female who was admitted from home and now s/p R TKR I&D with poly exchange and wound vac placement.  Pt with hx of RA, cervical fusion, and bil TKR with bil revisions including R patellar tendon repair 07/09/22    PT Comments  Pt progressing toward goals. Activity tolerance improving. Pt desires to d/c home vs SNF however is concerned about managing at home without assist; continue PT in acute setting, address stair training as needed    If plan is discharge home, recommend the following: A little help with walking and/or transfers;A little help with bathing/dressing/bathroom;Assistance with cooking/housework;Assist for transportation;Help with stairs or ramp for entrance   Can travel by private vehicle        Equipment Recommendations  None recommended by PT    Recommendations for Other Services       Precautions / Restrictions Precautions Precautions: Fall;Knee Precaution Comments: wound vac RLE Restrictions Weight Bearing Restrictions: No Other Position/Activity Restrictions: WBAT     Mobility  Bed Mobility               General bed mobility comments: patient was up in recliner and remained in the same at end of session with education provided on pillow positioning and risks of contractures. patient verbalized understanding.    Transfers Overall transfer level: Needs assistance Equipment used: Rolling walker (2 wheels) Transfers: Sit to/from Stand Sit to Stand: Min guard           General transfer comment: cues for LE management and use of UEs to self assist    Ambulation/Gait Ambulation/Gait assistance: Min assist, Min guard Gait Distance (Feet): 140 Feet Assistive device: Bilateral platform walker Gait Pattern/deviations: Step-to pattern, Decreased step length - right,  Decreased step length - left, Trunk flexed, Decreased stance time - right       General Gait Details: cues for sequence, posture and position from RW; intermittent assist to maneuver RW mostly with turns   Stairs             Wheelchair Mobility     Tilt Bed    Modified Rankin (Stroke Patients Only)       Balance   Sitting-balance support: No upper extremity supported, Feet supported Sitting balance-Leahy Scale: Good     Standing balance support: During functional activity, Reliant on assistive device for balance Standing balance-Leahy Scale: Fair                              Cognition Arousal/Alertness: Awake/alert Behavior During Therapy: WFL for tasks assessed/performed Overall Cognitive Status: Within Functional Limits for tasks assessed                                          Exercises Total Joint Exercises Ankle Circles/Pumps: AROM, Both, 5 reps    General Comments        Pertinent Vitals/Pain Pain Assessment Pain Assessment: 0-10 Pain Score: 4  Pain Location: R knee Pain Descriptors / Indicators: Aching, Sore Pain Intervention(s): Limited activity within patient's tolerance, Monitored during session, Premedicated before session, Repositioned, Ice applied    Home Living Family/patient expects to be discharged to:: Private residence Living Arrangements: Alone Available Help at Discharge:  Available PRN/intermittently (patients son works 12 hour shifts. off two days a week) Type of Home: House Home Access: Stairs to enter Entrance Stairs-Rails: Left Entrance Stairs-Number of Steps: 1+1+1 Alternate Level Stairs-Number of Steps: has stair lift Home Layout: Two level;1/2 bath on main level Home Equipment: Rolling Walker (2 wheels);Cane - single point;Crutches;Tub bench      Prior Function            PT Goals (current goals can now be found in the care plan section) Acute Rehab PT Goals Patient Stated Goal: HOME  and not SNF PT Goal Formulation: With patient Time For Goal Achievement: 11/16/22 Potential to Achieve Goals: Good Progress towards PT goals: Progressing toward goals    Frequency    7X/week      PT Plan Current plan remains appropriate    Co-evaluation              AM-PAC PT "6 Clicks" Mobility   Outcome Measure  Help needed turning from your back to your side while in a flat bed without using bedrails?: A Little Help needed moving from lying on your back to sitting on the side of a flat bed without using bedrails?: A Little Help needed moving to and from a bed to a chair (including a wheelchair)?: A Little Help needed standing up from a chair using your arms (e.g., wheelchair or bedside chair)?: A Little Help needed to walk in hospital room?: A Little Help needed climbing 3-5 steps with a railing? : A Lot 6 Click Score: 17    End of Session Equipment Utilized During Treatment: Gait belt Activity Tolerance: Patient tolerated treatment well Patient left: in chair;with call bell/phone within reach;with chair alarm set   PT Visit Diagnosis: Unsteadiness on feet (R26.81);Difficulty in walking, not elsewhere classified (R26.2);Pain Pain - Right/Left: Right Pain - part of body: Knee     Time: 8119-1478 PT Time Calculation (min) (ACUTE ONLY): 36 min  Charges:    $Gait Training: 23-37 mins PT General Charges $$ ACUTE PT VISIT: 1 Visit                     Lavida Patch, PT  Acute Rehab Dept ([facility]) [phone]  11/03/2022    [facility] 11/03/2022, 11:13 AM

## 2022-11-03 NOTE — Anesthesia Postprocedure Evaluation (Signed)
Anesthesia Post Note  Patient: [name], therapeutic  Procedure(s) Performed: Right knee irrigation and debridement; poly exchange; wound vac placement (Right: Knee)     Patient location during evaluation: PACU Anesthesia Type: General Level of consciousness: awake and alert Pain management: pain level controlled Vital Signs Assessment: post-procedure vital signs reviewed and stable Respiratory status: spontaneous breathing, nonlabored ventilation, respiratory function stable and patient connected to nasal cannula oxygen Cardiovascular status: blood pressure returned to baseline and stable Postop Assessment: no apparent nausea or vomiting Anesthetic complications: no   No notable events documented.  Last Vitals:  Vitals:   11/02/22 2106 11/03/22 0508  BP: 122/71 119/65  Pulse: 83 74  Resp: 16 17  Temp: 36.7 C 36.7 C  SpO2: 96% 92%    Last Pain:  Vitals:   11/03/22 1012  TempSrc:   PainSc: 5                  [name] [name] S

## 2022-11-03 NOTE — Evaluation (Addendum)
Occupational Therapy Evaluation Patient Details Name: [name] [name] MRN: [id number] DOB: 1960-11-06 Today's Date: 11/03/2022   History of Present Illness Patient is a 62 year old female who was admitted from home and now s/p R TKR I&D with poly exchange and wound vac placement.  Pt with hx of RA, cervical fusion, and bil TKR with bil revisions including R patellar tendon repair 07/09/22   Clinical Impression   Patient is a 62 year old female who was admitted for above. Patient was living at home mainly in her bedroom upstairs with PRN support from son who works. Patient was noted to have decreased functional activity tolerance, decreased endurance, decreased standing balance, increased pain, decreased safety awareness, and decreased knowledge of AD/AE impacting participation in ADLs. Patient reported her plan was to transition home with [facility] services and PRN support from son. Patient was educated on concerns over meals and safety while home alone. Patient verbalized understanding. Patient would continue to benefit from skilled OT services at this time while admitted and after d/c to address noted deficits in order to improve overall safety and independence in ADLs.        Recommendations for follow up therapy are one component of a multi-disciplinary discharge planning process, led by the attending physician.  Recommendations may be updated based on patient status, additional functional criteria and insurance authorization.   Assistance Recommended at Discharge Frequent or constant Supervision/Assistance  Patient can return home with the following A little help with walking and/or transfers;Assistance with cooking/housework;Direct supervision/assist for medications management;Assist for transportation;Help with stairs or ramp for entrance;Direct supervision/assist for financial management;A little help with bathing/dressing/bathroom    Functional Status Assessment  Patient has had a recent  decline in their functional status and demonstrates the ability to make significant improvements in function in a reasonable and predictable amount of time.  Equipment Recommendations  None recommended by OT       Precautions / Restrictions Precautions Precautions: Fall Precaution Comments: wound vac RLE Restrictions Weight Bearing Restrictions: No      Mobility Bed Mobility               General bed mobility comments: patient was up in recliner and remained in the same at end of session with education provided on pillow positioning and risks of contractures. patient verbalized understanding.           Balance Overall balance assessment: Needs assistance Sitting-balance support: No upper extremity supported, Feet supported Sitting balance-Leahy Scale: Good     Standing balance support: Single extremity supported, During functional activity Standing balance-Leahy Scale: Fair Standing balance comment: clothing management after toileting and washing hands at sink         ADL either performed or assessed with clinical judgement   ADL Overall ADL's : Needs assistance/impaired Eating/Feeding: Modified independent;Sitting Eating/Feeding Details (indicate cue type and reason): patient reported plan was to transition home with son at time of d/c patient reported that son works long hours but would be responsible for getting her all of her meals. patient reported that he would make her a sandwich to eat for one meal during his work hours. patient was educated that she would need more than one meal in a 12 hour period. patient verbalized understanding. patient also noted to have ordered two breakfasts at this time since she was still hungry after her original breakfast. Grooming: Set up;Sitting   Upper Body Bathing: Set up;Sitting   Lower Body Bathing: Moderate assistance;Sitting/lateral leans;Sit to/from stand Lower Body  Bathing Details (indicate cue type and reason): patient  reported being able to reach toes but did not demonstrate during this session. Upper Body Dressing : Set up;Sitting   Lower Body Dressing: Set up;Moderate assistance;Sitting/lateral leans;Sit to/from stand   Toilet Transfer: Minimal assistance;Ambulation;Rolling walker (2 wheels) Toilet Transfer Details (indicate cue type and reason): with bilateral platform walker to transfer from recliner in room to [facility] over toilet and back. patietn unsteady with intial sit to stand with avoidance of weight on RLE. patient able to transition better off commode. noted to have platform walker bump into objects in room on R side with turns. Toileting- Architect and Hygiene: Min guard;Sit to/from stand Toileting - Clothing Manipulation Details (indicate cue type and reason): with increased time             Vision Baseline Vision/History: 1 Wears glasses              Pertinent Vitals/Pain Pain Assessment Pain Assessment: 0-10 Pain Score: 6  Pain Location: R knee Pain Descriptors / Indicators: Aching, Sore Pain Intervention(s): Limited activity within patient's tolerance, Monitored during session, Patient requesting pain meds-RN notified, RN gave pain meds during session        Extremity/Trunk Assessment Upper Extremity Assessment Upper Extremity Assessment: Overall WFL for tasks assessed (noted to have signs of arthritis in bilateral hands with swaying of digits noted. patient reported having arthritis since being in her teens. using bilateral platform walker to reduce pressure on BUE.)   Lower Extremity Assessment Lower Extremity Assessment: Defer to PT evaluation       Communication Communication Communication: No difficulties   Cognition Arousal/Alertness: Awake/alert Behavior During Therapy: WFL for tasks assessed/performed Overall Cognitive Status: Within Functional Limits for tasks assessed                Home Living Family/patient expects to be discharged to::  Private residence Living Arrangements: Alone Available Help at Discharge: Available PRN/intermittently (patients son works 12 hour shifts. off two days a week) Type of Home: House Home Access: Stairs to enter [employer] of Steps: 1+1+1 Entrance Stairs-Rails: Left Home Layout: Two level;1/2 bath on main level Alternate Level Stairs-Number of Steps: has stair lift   Bathroom Shower/Tub: Tub/shower unit         Home Equipment: Agricultural consultant (2 wheels);Cane - single point;Crutches;Tub bench          Prior Functioning/Environment Prior Level of Function : Needs assist         Mobility Comments: ambulating household distances with RW ADLs Comments: patient has CNA at home that comes to help with showers and cleaning. patient reported having a nurse and PT HH that come 1x a week as well. patient reported using BSC at night at baseline with son helping with changing out basen.        OT Problem List: Decreased activity tolerance;Impaired balance (sitting and/or standing);Decreased coordination;Decreased safety awareness;Decreased knowledge of precautions;Decreased knowledge of use of DME or AE;Pain      OT Treatment/Interventions: Self-care/ADL training;Therapeutic exercise;DME and/or AE instruction;Therapeutic activities;Patient/family education;Balance training    OT Goals(Current goals can be found in the care plan section) Acute Rehab OT Goals Patient Stated Goal: to go home OT Goal Formulation: With patient Time For Goal Achievement: 11/17/22 Potential to Achieve Goals: Fair  OT Frequency: Min 1X/week       AM-PAC OT "6 Clicks" Daily Activity     Outcome Measure Help from another person eating meals?: None Help from another person taking care  of personal grooming?: A Little Help from another person toileting, which includes using toliet, bedpan, or urinal?: A Little Help from another person bathing (including washing, rinsing, drying)?: A Little Help from  another person to put on and taking off regular upper body clothing?: A Little Help from another person to put on and taking off regular lower body clothing?: A Lot 6 Click Score: 18   End of Session Equipment Utilized During Treatment: Gait belt;Other (comment) (bilateral platform walker.) Nurse Communication: Patient requests pain meds  Activity Tolerance: Patient tolerated treatment well Patient left: in chair;with call bell/phone within reach;with chair alarm set;with nursing/sitter in room  OT Visit Diagnosis: Unsteadiness on feet (R26.81);Other abnormalities of gait and mobility (R26.89);Pain Pain - Right/Left: Right Pain - part of body: Knee                Time: 4098-1191 OT Time Calculation (min): 29 min Charges:  OT General Charges $OT Visit: 1 Visit OT Evaluation $OT Eval Low Complexity: 1 Low OT Treatments $Self Care/Home Management : 8-22 mins  Rosalio Loud, MS Acute Rehabilitation Department Office# [phone]   Selinda Flavin 11/03/2022, 10:30 AM

## 2022-11-03 NOTE — TOC Initial Note (Signed)
Transition of Care [facility]) - Initial/Assessment Note   Patient Details  Name: [name] [name] MRN: [id number] Date of Birth: 1960/04/29  Transition of Care [facility]) CM/SW Contact:    Ewing Schlein, LCSW Phone Number: 11/03/2022, 2:30 PM  Clinical Narrative: [facility] consulted for possible SNF placement. CSW met with patient to discuss discharging home vs. SNF. Patient reported she would like to go to [facility] as her first choice.  FL2 done; PASRR confirmed. Initial referral faxed out. TOC awaiting bed offers and cultures regarding possible IV antibiotics.                Expected Discharge Plan: Skilled Nursing Facility Barriers to Discharge: Continued Medical Work up  Patient Goals and CMS Choice Patient states their goals for this hospitalization and ongoing recovery are:: Go to [facility] for rehab CMS Medicare.gov Compare Post Acute Care list provided to:: Patient Choice offered to / list presented to : Patient  Expected Discharge Plan and Services In-house Referral: Clinical Social Work Post Acute Care Choice: Skilled Nursing Facility Living arrangements for the past 2 months: Apartment  Prior Living Arrangements/Services Living arrangements for the past 2 months: Apartment Lives with:: Self Patient language and need for interpreter reviewed:: Yes Do you feel safe going back to the place where you live?: Yes      Need for Family Participation in Patient Care: Yes (Comment) Care giver support system in place?: Yes (comment) Criminal Activity/Legal Involvement Pertinent to Current Situation/Hospitalization: No - Comment as needed  Activities of Daily Living Home Assistive Devices/Equipment: Bedside commode/3-in-1, Eyeglasses, Shower chair with back, Environmental consultant (specify type), Wheelchair ADL Screening (condition at time of admission) Patient's cognitive ability adequate to safely complete daily activities?: Yes Is the patient deaf or have difficulty hearing?: No Does the  patient have difficulty seeing, even when wearing glasses/contacts?: No Does the patient have difficulty concentrating, remembering, or making decisions?: No Patient able to express need for assistance with ADLs?: Yes Does the patient have difficulty dressing or bathing?: No Independently performs ADLs?: Yes (appropriate for developmental age) Does the patient have difficulty walking or climbing stairs?: Yes Weakness of Legs: Right Weakness of Arms/Hands: None  Permission Sought/Granted Permission sought to share information with : Facility Industrial/product designer granted to share information with : Yes, Verbal Permission Granted Permission granted to share info w AGENCY: SNFs  Emotional Assessment Appearance:: Appears stated age Attitude/Demeanor/Rapport: Engaged Affect (typically observed): Accepting Orientation: : Oriented to Self, Oriented to Place, Oriented to  Time, Oriented to Situation Alcohol / Substance Use: Not Applicable Psych Involvement: No (comment)  Admission diagnosis:  History of total knee arthroplasty, right [Z96.651] Patient Active Problem List   Diagnosis Date Noted   History of total knee arthroplasty, right 11/01/2022   Painful total knee replacement, right (HCC) 07/08/2022   Failed total knee arthroplasty (HCC) 12/04/2021   Failed total left knee replacement (HCC) 12/04/2021   Status post total left knee replacement 02/28/2019   Chronic pain of left knee 02/28/2019   Status post total knee replacement, bilateral 12/01/2016   Status post bilateral foot surgery 12/01/2016   Contracture of right elbow 06/24/2016   Contracture of left elbow 06/24/2016   Rheumatoid arthritis involving multiple sites with positive rheumatoid factor (HCC) 05/27/2016   ANA positive 05/27/2016   Lateral epicondylitis, left elbow 05/27/2016   Trochanteric bursitis of left hip 05/27/2016   High risk medication use 05/27/2016   DJD (degenerative joint disease), cervical  05/27/2016   Osteopenia of neck  of left femur 05/27/2016   Primary osteoarthritis of both feet 05/27/2016   S/P TAH-BSO (5/30) 09/04/2012   PCP:  Ollen Bowl, MD Pharmacy:   [facility] - [place], [place] - [address] [place] [address] Phone: [phone] Fax: [phone]  [id number] [place], [place] - [address] [place] [address] Phone: [phone] Fax: [phone]  Social Determinants of Health (SDOH) Social History: SDOH Screenings   Food Insecurity: Food Insecurity Present (11/01/2022)  Housing: Low Risk  (11/01/2022)  Transportation Needs: No Transportation Needs (11/01/2022)  Utilities: Not At Risk (11/01/2022)  Tobacco Use: Low Risk  (11/01/2022)   SDOH Interventions: Food Insecurity Interventions: Inpatient TOC, Intervention Not Indicated (Patient did not identify a need at this time.)  Readmission Risk Interventions    07/14/2022    3:11 PM  Readmission Risk Prevention Plan  Post Dischage Appt Complete  Medication Screening Complete  Transportation Screening Complete

## 2022-11-04 ENCOUNTER — Encounter (HOSPITAL_COMMUNITY): Payer: Self-pay | Admitting: Orthopedic Surgery

## 2022-11-04 DIAGNOSIS — T84012D Broken internal right knee prosthesis, subsequent encounter: Secondary | ICD-10-CM

## 2022-11-04 NOTE — Progress Notes (Signed)
Physical Therapy Treatment Patient Details Name: [name] [name] MRN: [id number] DOB: 1960/06/07 Today's Date: 11/04/2022   History of Present Illness Patient is a 62 year old female who was admitted from home and now s/p R TKR I&D with poly exchange and wound vac placement.  Pt with hx of RA, cervical fusion, and bil TKR with bil revisions including R patellar tendon repair 07/09/22    PT Comments  Pt reports to PT that she has to go home d/t issues with a bill at a facility. Pt agreeable to mobilize, amb ~ 35' with Bil PFRW and min/guard assist. Limited by fatigue; encouraged pt to stay off of purewick to incr mobility as much as possible if the plan is for home. Will need to work on amb with VAC attached to RW and managing tubing in preparation for home.    If plan is discharge home, recommend the following: A little help with walking and/or transfers;A little help with bathing/dressing/bathroom;Assistance with cooking/housework;Assist for transportation;Help with stairs or ramp for entrance   Can travel by private vehicle     Yes  Equipment Recommendations  None recommended by PT    Recommendations for Other Services       Precautions / Restrictions Precautions Precautions: Fall;Knee Precaution Comments: wound vac RLE Restrictions Weight Bearing Restrictions: No Other Position/Activity Restrictions: WBAT     Mobility  Bed Mobility Overal bed mobility: Needs Assistance Bed Mobility: Sit to Supine, Supine to Sit     Supine to sit: Min guard, HOB elevated Sit to supine: Min guard   General bed mobility comments: able to self assist using gait belt with incr time    Transfers Overall transfer level: Needs assistance Equipment used: Rolling walker (2 wheels) Transfers: Sit to/from Stand Sit to Stand: Min guard           General transfer comment: cues for LE management and hand placement. incr time needed    Ambulation/Gait Ambulation/Gait assistance: Min  guard Gait Distance (Feet): 60 Feet Assistive device: Bilateral platform walker Gait Pattern/deviations: Step-to pattern, Decreased step length - right, Decreased step length - left, Trunk flexed, Decreased stance time - right       General Gait Details: cues for sequence, posture and position from RW; min/guard for safety, unsteady gait at times however no overt LOB   Stairs             Wheelchair Mobility     Tilt Bed    Modified Rankin (Stroke Patients Only)       Balance   Sitting-balance support: No upper extremity supported, Feet supported Sitting balance-Leahy Scale: Good     Standing balance support: During functional activity, Reliant on assistive device for balance Standing balance-Leahy Scale: Fair                              Cognition Arousal/Alertness: Awake/alert Behavior During Therapy: WFL for tasks assessed/performed Overall Cognitive Status: Within Functional Limits for tasks assessed                                          Exercises      General Comments        Pertinent Vitals/Pain Pain Assessment Pain Assessment: 0-10 Pain Score: 5  Pain Location: R knee Pain Descriptors / Indicators: Aching, Sore Pain Intervention(s): Limited activity within  patient's tolerance, Monitored during session, Premedicated before session, Repositioned, Ice applied    Home Living                          Prior Function            PT Goals (current goals can now be found in the care plan section) Acute Rehab PT Goals Patient Stated Goal: HOME and not SNF PT Goal Formulation: With patient Time For Goal Achievement: 11/16/22 Potential to Achieve Goals: Good Progress towards PT goals: Progressing toward goals    Frequency    7X/week      PT Plan Current plan remains appropriate    Co-evaluation              AM-PAC PT "6 Clicks" Mobility   Outcome Measure  Help needed turning from your  back to your side while in a flat bed without using bedrails?: A Little Help needed moving from lying on your back to sitting on the side of a flat bed without using bedrails?: A Little Help needed moving to and from a bed to a chair (including a wheelchair)?: A Little Help needed standing up from a chair using your arms (e.g., wheelchair or bedside chair)?: A Little Help needed to walk in hospital room?: A Little Help needed climbing 3-5 steps with a railing? : A Lot 6 Click Score: 17    End of Session Equipment Utilized During Treatment: Gait belt Activity Tolerance: Patient tolerated treatment well Patient left: with call bell/phone within reach;in bed;with bed alarm set   PT Visit Diagnosis: Unsteadiness on feet (R26.81);Difficulty in walking, not elsewhere classified (R26.2);Pain Pain - Right/Left: Right Pain - part of body: Knee     Time: 5784-6962 PT Time Calculation (min) (ACUTE ONLY): 27 min  Charges:    $Gait Training: 8-22 mins $Therapeutic Activity: 8-22 mins PT General Charges $$ ACUTE PT VISIT: 1 Visit                     Saraiah Bhat, PT  Acute Rehab Dept ([facility]) [phone]  11/04/2022    [facility] 11/04/2022, 2:35 PM

## 2022-11-04 NOTE — TOC Progression Note (Addendum)
Transition of Care [facility]) - Progression Note    Patient Details  Name: [name] MRN: [id number] Date of Birth: 18-Nov-1960  [facility] CM/SW Contact  Ewing Schlein, LCSW Phone Number: 11/04/2022, 4:52 PM  Clinical Narrative: CSW notified by [facility] that patient is in copay days and owes [facility] (the sister facility) from the previous rehab stay, so neither facility will accept her back until the bill is paid. CSW confirmed with Alvino Chapel at [facility] that the bill is for $3164.00. Alvino Chapel also reported the patient is not eligible for Medicaid, so the facility was unable to apply for it on behalf of the patient. CSW updated patient that unless the bill is paid, CSW cannot get a SNF to accept the patient [facility] has now withdrawn the bed offer). Patient reported she is on a fixed income and cannot pay the bill.  Patient reported she is active with [facility] and would like to resume services with the agency if she cannot go to SNF.  As patient cannot afford the cost of copays for SNF, patient is expected to discharge home with a [facility] wound VAC and IV antibiotics with [facility]. CSW followed up with Pam with [facility] and left a VM for [place] with [facility]M regarding the wound VAC.  Addendum: Marylene Land with [facility] confirmed patient is active for PT, RN, and aide and new HH orders will be needed at discharge.  Expected Discharge Plan: Home w Home Health Services Barriers to Discharge: Continued Medical Work up  Expected Discharge Plan and Services In-house Referral: Clinical Social Work Post Acute Care Choice: Skilled Nursing Facility Living arrangements for the past 2 months: Apartment           DME Arranged: Other see comment ([facility] wound VAC) DME Agency: [facility] Date DME Agency Contacted: 11/04/22 Representative spoke with at DME Agency: [facility] HH Arranged: IV Antibiotics HH Agency: [facility] Date HH Agency Contacted: 11/04/22 Representative spoke with at  [facility] Agency: Pam  Social Determinants of Health (SDOH) Interventions SDOH Screenings   Food Insecurity: Food Insecurity Present (11/01/2022)  Housing: Low Risk  (11/01/2022)  Transportation Needs: No Transportation Needs (11/01/2022)  Utilities: Not At Risk (11/01/2022)  Tobacco Use: Low Risk  (11/01/2022)   Readmission Risk Interventions    07/14/2022    3:11 PM  Readmission Risk Prevention Plan  Post Dischage Appt Complete  Medication Screening Complete  Transportation Screening Complete

## 2022-11-04 NOTE — Progress Notes (Addendum)
   Subjective: 3 Days Post-Op Procedure(s) (LRB): Right knee irrigation and debridement; poly exchange; wound vac placement (Right) Patient reports pain as mild.   Patient seen in rounds by Dr. Lequita Halt. Patient is well, and has had no acute complaints or problems Plan is to go to Skilled nursing facility after hospital stay.  Objective: Vital signs in last 24 hours: Temp:  [97.8 F (36.6 C)-98.8 F (37.1 C)] 98.3 F (36.8 C) (07/30 0458) Pulse Rate:  [68-95] 95 (07/30 0458) Resp:  [16-17] 16 (07/30 0458) BP: (115-130)/(54-73) 117/71 (07/30 0458) SpO2:  [97 %-98 %] 97 % (07/30 0458)  Intake/Output from previous day:  Intake/Output Summary (Last 24 hours) at 11/04/2022 0823 Last data filed at 11/04/2022 0458 Gross per 24 hour  Intake 590 ml  Output 2150 ml  Net -1560 ml    Intake/Output this shift: No intake/output data recorded.  Labs: Recent Labs    11/02/22 0329 11/03/22 0303  HGB 9.9* 9.9*   Recent Labs    11/02/22 0329 11/03/22 0303  WBC 10.0 11.3*  RBC 3.95 3.90  HCT 32.8* 32.2*  PLT 280 358   Recent Labs    11/02/22 0329  NA 135  K 3.6  CL 104  CO2 24  BUN 10  CREATININE 0.58  GLUCOSE 162*  CALCIUM 8.1*   No results for input(s): "LABPT", "INR" in the last 72 hours.  Exam: General - Patient is Alert and Oriented Extremity - Neurologically intact Neurovascular intact Sensation intact distally Dorsiflexion/Plantar flexion intact Dressing/Incision - clean and dry. Wound vac in place. Motor Function - intact, moving foot and toes well on exam.   Past Medical History:  Diagnosis Date   Complication of anesthesia    "irregular heartbeat" during neck surgery   Depression    Fibroid    Neuromuscular disorder (HCC)    Pre-diabetes    Rheumatoid arthritis (HCC)    Urinary incontinence     Assessment/Plan: 3 Days Post-Op Procedure(s) (LRB): Right knee irrigation and debridement; poly exchange; wound vac placement (Right) Principal  Problem:   Failed total knee arthroplasty (HCC) Active Problems:   History of total knee arthroplasty, right  Estimated body mass index is 29.8 kg/m as calculated from the following:   Height as of this encounter: 5\' 2"  (1.575 m).   Weight as of this encounter: 73.9 kg. Up with therapy  DVT Prophylaxis - Aspirin Weight-bearing as tolerated  Intraoperative cultures showed rare Morganella and Pseudomonas. Pharmacy has switched IV abx tentatively.  Will consult ID today for further abx recommendations.  Hold on PICC line order until we get their input. Will also consult wound care for wound vac change.  Arther Abbott, PA-C Orthopedic Surgery [phone] 11/04/2022, 8:23 AM

## 2022-11-04 NOTE — Progress Notes (Signed)
PT Cancellation Note  Patient Details Name: [name] [name] MRN: [id number] DOB: January 29, 1961   Cancelled Treatment:    Reason Eval/Treat Not Completed: Other (comment); pt declined PT/OOB d/t being upset regarding d/c plan;  NT with pt replacing purewick; (PT/OT have encouraged pt to remain off purewick) Continue efforts to mobilize   [facility] 11/04/2022, 5:14 PM

## 2022-11-04 NOTE — Progress Notes (Signed)
Occupational Therapy Treatment Patient Details Name: [name] [name] MRN: [id number] DOB: [birth year] Today's Date: 11/04/2022   History of present illness Patient is a 62 year old female who was admitted from home and now s/p R TKR I&D with poly exchange and wound vac placement.  Pt with hx of RA, cervical fusion, and bil TKR with bil revisions including R patellar tendon repair 07/09/22   OT comments  Patient continues to debate between transitioning home and SNF. Concerns persist about patients ability to care for self in next level of care and ensuring that patient has access to meals while son is at work. Patient's session was limited by pain with education on using AE for LB Dressing/bathing tasks. Patient reported having a reacher at home. Patient appeared to have decreased insight as far as all that was needed to be accomplished to transition home with her current available level of assistance. Patient will need 24/7 caregiver support to be successful in next level of care. Patient's discharge plan remains appropriate at this time. OT will continue to follow acutely.     Recommendations for follow up therapy are one component of a multi-disciplinary discharge planning process, led by the attending physician.  Recommendations may be updated based on patient status, additional functional criteria and insurance authorization.    Assistance Recommended at Discharge Frequent or constant Supervision/Assistance  Patient can return home with the following  A little help with walking and/or transfers;Assistance with cooking/housework;Direct supervision/assist for medications management;Assist for transportation;Help with stairs or ramp for entrance;Direct supervision/assist for financial management;A little help with bathing/dressing/bathroom   Equipment Recommendations  None recommended by OT       Precautions / Restrictions Precautions Precautions: Fall;Knee Precaution Comments: wound vac  RLE Restrictions Weight Bearing Restrictions: No Other Position/Activity Restrictions: WBAT       Mobility Bed Mobility Overal bed mobility: Needs Assistance Bed Mobility: Sit to Supine, Supine to Sit     Supine to sit: Min guard, HOB elevated Sit to supine: Min guard   General bed mobility comments: able to self assist using gait belt with incr time             ADL either performed or assessed with clinical judgement   ADL Overall ADL's : Needs assistance/impaired                       Lower Body Dressing Details (indicate cue type and reason): patient reported that she planned to transition home. patient was educated on how to use reacher and sock aid to don/doff socks and simulated pants. patient noted to have increased diffiuclty with completion of this portion of task on RLE with reports of increased pain with attempts. patient reported she did not want to do this because it hurt. patient was educated that pain was something she would have to deal with at home as well since she was planning to transition home alone during the day.               General ADL Comments: patient was noted to have pure wic in place at start of session with patient reporetd " i really needed to use the bathroom". patient was educated on alternatives of using BSC to increase functional activity tolerance and make progress towards transitioning home. patient reported that she needed to use the bathroom and she needed to use pure wic. patient continued to be educated as well as Education administrator to encourage patient to use pure wic  less. note was added to board in room to carry this over as well.      Cognition Arousal/Alertness: Awake/alert Behavior During Therapy: WFL for tasks assessed/performed Overall Cognitive Status: Within Functional Limits for tasks assessed           General Comments: patient was upset with insurance company and bills during session                    Pertinent Vitals/ Pain       Pain Assessment Pain Assessment: 0-10 Pain Score: 5  Pain Location: R knee Pain Descriptors / Indicators: Aching, Sore Pain Intervention(s): Limited activity within patient's tolerance, Monitored during session, Patient requesting pain meds-RN notified         Frequency  Min 1X/week        Progress Toward Goals  OT Goals(current goals can now be found in the care plan section)  Progress towards OT goals: Not progressing toward goals - comment (pain limiting progress)     Plan Discharge plan remains appropriate       AM-PAC OT "6 Clicks" Daily Activity     Outcome Measure   Help from another person eating meals?: None Help from another person taking care of personal grooming?: A Little Help from another person toileting, which includes using toliet, bedpan, or urinal?: A Little Help from another person bathing (including washing, rinsing, drying)?: A Little Help from another person to put on and taking off regular upper body clothing?: A Little Help from another person to put on and taking off regular lower body clothing?: A Lot 6 Click Score: 18    End of Session Equipment Utilized During Treatment: Gait belt;Other (comment) (total hip kit)  OT Visit Diagnosis: Unsteadiness on feet (R26.81);Other abnormalities of gait and mobility (R26.89);Pain Pain - Right/Left: Right Pain - part of body: Knee   Activity Tolerance Patient tolerated treatment well   Patient Left with call bell/phone within reach;with nursing/sitter in room;in bed;with bed alarm set   Nurse Communication Patient requests pain meds        Time: 5621-3086 OT Time Calculation (min): 19 min  Charges: OT General Charges $OT Visit: 1 Visit OT Treatments $Self Care/Home Management : 8-22 mins  Rosalio Loud, MS Acute Rehabilitation Department Office# [phone]   Selinda Flavin 11/04/2022, 4:46 PM

## 2022-11-04 NOTE — Progress Notes (Incomplete)
PHARMACY CONSULT NOTE FOR:  OUTPATIENT  PARENTERAL ANTIBIOTIC THERAPY (OPAT)  Indication:  Regimen:  End date:   IV antibiotic discharge orders are pended. To discharging provider:  please sign these orders via discharge navigator,  Select New Orders & click on the button choice - Manage This Unsigned Work.     Thank you for allowing pharmacy to be a part of this patient's care.  Rolley Sims 11/04/2022, 2:06 PM

## 2022-11-04 NOTE — Consult Note (Signed)
WOC consulted for NPWT dressing changes to the right knee, added to schedule for FU Wednesday for M/W/F NPWT change. Requested nursing staff to order dressing kits for change.   Evelin Cake [facility], CNS, [employer] [phone]

## 2022-11-04 NOTE — Consult Note (Signed)
[facility]    Date of Admission:  11/01/2022   Total days of inpatient antibiotics 2        Reason for Consult: PJI    Principal Problem:   Failed total knee arthroplasty [facility]) Active Problems:   History of total knee arthroplasty, right   Assessment: 62 year old female with history of neuromuscular disorder, rheumatoid arthritis on methotrexate, complex history regarding right knee admitted with:  #Right knee PJI status post I&D and polyethylene exchange with wound VAC placement 7/27 - Patient had undergone TKA revision 4 months ago postop course was complicated by increased pain 2 weeks later.  She underwent polyethylene revision and repair of patellar  tendon rupture on 07/08/2022 complicated by stitch abscess which was healing until 2 weeks ago.  Admitted for right knee I&D and poly exchange, OR cultures grew Pseudomonas aeruginosa and Morganella Morganii.  Infectious disease engaged. -On review looks like Keflex was prescribed on 718 for a week  Recommendations:  -D/C cefazolin - Start cefepime. Will hold off on PICC until sens result.  - Follow or cultures with sensitivities  #RA on MTX and Orencia -Last dose of both meds 2 weeks ago -Followed by Dr. Hawks(Rheumatology) -Would recc holding both while on treatment for PJI, Of not pt will need chronic abx suppression as she prosthesis is retained.  -Would recc f/u with rheum following discharge. I contacted Rheum in  regards to plan obove Microbiology:   Antibiotics: Cefazolin 7/27-present  Cultures: Other 7/27 Morganella morganii and Pseudomonas aeruginosa  HPI: [name] [name] is a 62 y.o. female with depression, neuromuscular disorder, rheumatoid arthritis on methotrexate, complex history regarding right knee.  She had right TKA revision about 4 months ago, and the first 2 weeks postop had an episode where she had marked increase in pain, noted to have dislocation of the  prosthesis that tibiofemoral joint.  Underwent polyethylene revision noted patellar tendon rupture that was repaired complicated by stitch abscess which was beginning to heal till pain returned 2 weeks ago..  Admitted for right knee I&D, polyethylene exchange.  Taken to the OR by Dr. Antony Odea on 7/27.  Infectious disease engaged as or cultures grew Morganella Morganella and Pseudomonas aeruginosa   Review of Systems: Review of Systems  All other systems reviewed and are negative.   Past Medical History:  Diagnosis Date   Complication of anesthesia    "irregular heartbeat" during neck surgery   Depression    Fibroid    Neuromuscular disorder (HCC)    Pre-diabetes    Rheumatoid arthritis (HCC)    Urinary incontinence     Social History   Tobacco Use   Smoking status: Never   Smokeless tobacco: Never  Vaping Use   Vaping status: Never Used  Substance Use Topics   Alcohol use: Yes    Comment: Rare wine   Drug use: No    Family History  Problem Relation Age of Onset   Ovarian cancer Mother 5   Lung cancer Father        asbestos   Cancer Brother 50       appendiceal   Seizures Brother    Heart Problems Brother    Colon cancer Maternal Aunt    Breast cancer Paternal Aunt 76   Colon cancer Maternal Grandmother 2   Stroke Paternal Grandmother    Heart disease Paternal Grandmother    Breast cancer Paternal Aunt 15   Scheduled Meds:  aspirin  81 mg Oral BID   citalopram  20 mg Oral Daily   docusate sodium  100 mg Oral BID   mirabegron ER  50 mg Oral Daily   Continuous Infusions:  sodium chloride Stopped (11/02/22 1426)   ceFEPime (MAXIPIME) IV 2 g (11/04/22 0230)   methocarbamol (ROBAXIN) IV 110 mL/hr at 11/02/22 1643   PRN Meds:.bisacodyl, diphenhydrAMINE, HYDROmorphone (DILAUDID) injection, lip balm, menthol-cetylpyridinium **OR** phenol, methocarbamol **OR** methocarbamol (ROBAXIN) IV, metoCLOPramide **OR** metoCLOPramide (REGLAN) injection, ondansetron **OR**  ondansetron (ZOFRAN) IV, oxyCODONE, oxyCODONE, polyethylene glycol, sodium phosphate Allergies  Allergen Reactions   Sulfa Antibiotics Shortness Of Breath   Morphine Rash   Penicillins Rash    Tolerated Cephalosporin Date: 12/05/21.    OBJECTIVE: Blood pressure 117/71, pulse 95, temperature 98.3 F (36.8 C), temperature source Oral, resp. rate 16, height 5\' 2"  (1.575 m), weight 73.9 kg, last menstrual period 04/07/2012, SpO2 97%.  Physical Exam Constitutional:      Appearance: Normal appearance.  HENT:     Head: Normocephalic and atraumatic.     Right Ear: Tympanic membrane normal.     Left Ear: Tympanic membrane normal.     Nose: Nose normal.     Mouth/Throat:     Mouth: Mucous membranes are moist.  Eyes:     Extraocular Movements: Extraocular movements intact.     Conjunctiva/sclera: Conjunctivae normal.     Pupils: Pupils are equal, round, and reactive to light.  Cardiovascular:     Rate and Rhythm: Normal rate and regular rhythm.     Heart sounds: No murmur heard.    No friction rub. No gallop.  Pulmonary:     Effort: Pulmonary effort is normal.     Breath sounds: Normal breath sounds.  Abdominal:     General: Abdomen is flat.     Palpations: Abdomen is soft.  Musculoskeletal:     Comments: Right knee bandaged  Skin:    General: Skin is warm and dry.  Neurological:     General: No focal deficit present.     Mental Status: She is alert and oriented to person, place, and time.  Psychiatric:        Mood and Affect: Mood normal.     Lab Results Lab Results  Component Value Date   WBC 11.3 (H) 11/03/2022   HGB 9.9 (L) 11/03/2022   HCT 32.2 (L) 11/03/2022   MCV 82.6 11/03/2022   PLT 358 11/03/2022    Lab Results  Component Value Date   CREATININE 0.58 11/02/2022   BUN 10 11/02/2022   NA 135 11/02/2022   K 3.6 11/02/2022   CL 104 11/02/2022   CO2 24 11/02/2022    Lab Results  Component Value Date   ALT 11 10/30/2020   AST 15 10/30/2020   ALKPHOS 83  10/18/2016   BILITOT 0.5 10/30/2020       [name] Earthly, MD [facility] 11/04/2022, 5:29 AM    I have personally spent 82 minutes involved in face-to-face and non-face-to-face activities for this patient on the day of the visit. Professional time spent includes the following activities: Preparing to see the patient (review of tests), Obtaining and/or reviewing separately obtained history (admission/discharge record), Performing a medically appropriate examination and/or evaluation , Ordering medications/tests/procedures, referring and communicating with other health care professionals, Documenting clinical information in the EMR, Independently interpreting results (not separately reported), Communicating results to the patient/family/caregiver, Counseling and educating the patient/family/caregiver and Care coordination (not separately reported).

## 2022-11-04 NOTE — Care Management Important Message (Signed)
Important Message  Patient Details IM Letter given Name: Lecretia Semler MRN: [id number] Date of Birth: 06-17-1960   Medicare Important Message Given:  Yes     Caren Macadam 11/04/2022, 2:48 PM

## 2022-11-05 DIAGNOSIS — T84012D Broken internal right knee prosthesis, subsequent encounter: Secondary | ICD-10-CM | POA: Diagnosis not present

## 2022-11-05 MED ORDER — SILVER NITRATE-POT NITRATE 75-25 % EX MISC
1.0000 | CUTANEOUS | Status: DC | PRN
  Filled 2022-11-05: qty 1

## 2022-11-05 MED ORDER — LEVOFLOXACIN 500 MG PO TABS
750.0000 mg | ORAL_TABLET | Freq: Every day | ORAL | Status: DC
  Administered 2022-11-05 – 2022-11-07 (×3): 750 mg via ORAL
  Filled 2022-11-05 (×3): qty 2

## 2022-11-05 NOTE — TOC Progression Note (Signed)
Transition of Care [facility]) - Progression Note   Patient Details  Name: [name] [name] MRN: [id number] Date of Birth: 1960/11/08  Transition of [facility]) CM/SW Contact  Ewing Schlein, LCSW Phone Number: 11/05/2022, 2:33 PM  Clinical Narrative: CSW met with patient and patient confirmed she cannot pay for the copays for SNF up front, so she will be unable to discharge to SNF. Patient will resume HH through [facility] and the agency confirmed they would be able to manage the wound VAC changes 3x's/week. CSW made referral to [facility] with [facility]M for a [facility] wound VAC. Patient is now expected to discharge home on PO antibiotics rather than IV. TOC awaiting insurance approval for [facility].  Expected Discharge Plan: Home w Home Health Services Barriers to Discharge: Continued Medical Work up  Expected Discharge Plan and Services In-house Referral: Clinical Social Work Post Acute Care Choice: Home Health Living arrangements for the past 2 months: Apartment            DME Arranged: Other see comment ([facility] wound VAC) DME Agency: [facility] Date DME Agency Contacted: 11/04/22 Representative spoke with at DME Agency: [facility] HH Arranged: RN, PT, Nurse's Aide [facility] Agency: Other - See comment Cindie Laroche) Date HH Agency Contacted: 11/04/22 Representative spoke with at [facility] Agency: Marylene Land  Social Determinants of Health (SDOH) Interventions SDOH Screenings   Food Insecurity: Food Insecurity Present (11/01/2022)  Housing: Low Risk  (11/01/2022)  Transportation Needs: No Transportation Needs (11/01/2022)  Utilities: Not At Risk (11/01/2022)  Tobacco Use: Low Risk  (11/01/2022)   Readmission Risk Interventions    07/14/2022    3:11 PM  Readmission Risk Prevention Plan  Post Dischage Appt Complete  Medication Screening Complete  Transportation Screening Complete

## 2022-11-05 NOTE — Progress Notes (Signed)
OT Cancellation Note  Patient Details Name: [name] [name] MRN: [id number] DOB: 1961/01/12   Cancelled Treatment:    Reason Eval/Treat Not Completed: Patient not medically ready Patient in bed awaiting WOC nurse to address wound vac. OT to continue to follow and check back as schedule will allow.  Rosalio Loud, MS Acute Rehabilitation Department Office# [phone]  11/05/2022, 11:01 AM

## 2022-11-05 NOTE — Progress Notes (Signed)
   Subjective: 4 Days Post-Op Procedure(s) (LRB): Right knee irrigation and debridement; poly exchange; wound vac placement (Right) Patient reports pain as mild. Seen by Dr. Lequita Halt this AM. No issues overnight or complaints.   Objective: Vital signs in last 24 hours: Temp:  [98.6 F (37 C)-99 F (37.2 C)] 98.7 F (37.1 C) (07/31 1404) Pulse Rate:  [85-95] 89 (07/31 1404) Resp:  [15-16] 15 (07/31 1404) BP: (104-140)/(59-84) 112/59 (07/31 1404) SpO2:  [97 %-99 %] 99 % (07/31 1404)  Intake/Output from previous day:  Intake/Output Summary (Last 24 hours) at 11/05/2022 1452 Last data filed at 11/04/2022 1948 Gross per 24 hour  Intake 240 ml  Output 225 ml  Net 15 ml    Intake/Output this shift: No intake/output data recorded.  Labs: Recent Labs    11/03/22 0303  HGB 9.9*   Recent Labs    11/03/22 0303  WBC 11.3*  RBC 3.90  HCT 32.2*  PLT 358   No results for input(s): "NA", "K", "CL", "CO2", "BUN", "CREATININE", "GLUCOSE", "CALCIUM" in the last 72 hours. No results for input(s): "LABPT", "INR" in the last 72 hours.  Exam: General - Patient is Alert and Oriented Extremity - Neurologically intact Neurovascular intact Sensation intact distally Dorsiflexion/Plantar flexion intact Dressing/Incision - Wound vac in place Motor Function - intact, moving foot and toes well on exam.   Past Medical History:  Diagnosis Date   Complication of anesthesia    "irregular heartbeat" during neck surgery   Depression    Fibroid    Neuromuscular disorder (HCC)    Pre-diabetes    Rheumatoid arthritis (HCC)    Urinary incontinence     Assessment/Plan: 4 Days Post-Op Procedure(s) (LRB): Right knee irrigation and debridement; poly exchange; wound vac placement (Right) Principal Problem:   Failed total knee arthroplasty (HCC) Active Problems:   History of total knee arthroplasty, right  Estimated body mass index is 29.8 kg/m as calculated from the following:   Height as  of this encounter: 5\' 2"  (1.575 m).   Weight as of this encounter: 73.9 kg. Up with therapy  DVT Prophylaxis - Aspirin Weight-bearing as tolerated  Wound vac dressing changed today by wound care nurse ID recommendations in, planning for levaquin PO Should be able to discharge tomorrow with home health. Due to issues with copays, SNF no longer a viable option unfortunately.  Arther Abbott, PA-C Orthopedic Surgery [phone] 11/05/2022, 2:52 PM

## 2022-11-05 NOTE — Progress Notes (Signed)
Physical Therapy Treatment Patient Details Name: [name] [name] MRN: [id number] DOB: Oct 28, 1960 Today's Date: 11/05/2022   History of Present Illness Patient is a 62 year old female who was admitted from home and now s/p R TKR I&D with poly exchange and wound vac placement.  Pt with hx of RA, cervical fusion, and bil TKR with bil revisions including R patellar tendon repair 07/09/22    PT Comments  Pt agreeable to exercises, too tired to attempt amb.  Discussed  importance of mobilization and pt is agreeable to incr amb tomorrow.    If plan is discharge home, recommend the following: A little help with walking and/or transfers;A little help with bathing/dressing/bathroom;Assistance with cooking/housework;Assist for transportation;Help with stairs or ramp for entrance   Can travel by private vehicle        Equipment Recommendations  None recommended by PT    Recommendations for Other Services       Precautions / Restrictions Precautions Precautions: Fall;Knee Precaution Comments: wound vac RLE Restrictions Weight Bearing Restrictions: No Other Position/Activity Restrictions: WBAT     Mobility  Bed Mobility Overal bed mobility: Needs Assistance Bed Mobility: Sit to Supine, Supine to Sit     Supine to sit: Min assist Sit to supine: Min assist   General bed mobility comments: assist with RLE d/t pain    Transfers Overall transfer level: Needs assistance Equipment used: Rolling walker (2 wheels) Transfers: Sit to/from Stand Sit to Stand: Min assist           General transfer comment: assist to rise and stabilize, decr wt on RLE d/t pain    Ambulation/Gait                   Stairs             Wheelchair Mobility     Tilt Bed    Modified Rankin (Stroke Patients Only)       Balance                                            Cognition Arousal/Alertness: Awake/alert Behavior During Therapy: WFL for tasks  assessed/performed Overall Cognitive Status: Within Functional Limits for tasks assessed                                 General Comments: VAC turned off and wound bleeding- reinforced with gauze and tape        Exercises Total Joint Exercises Ankle Circles/Pumps: AROM, Both, 10 reps Quad Sets: AROM, Both, 10 reps Heel Slides: AAROM, Both, 10 reps Hip ABduction/ADduction: AAROM, Both, 10 reps Straight Leg Raises: AROM, AAROM, Strengthening, Both, 10 reps Straight Leg Raises Limitations: AAROM on R    General Comments        Pertinent Vitals/Pain Pain Assessment Pain Assessment: 0-10 Pain Score: 7  Pain Location: R knee Pain Descriptors / Indicators: Aching, Sore Pain Intervention(s): Limited activity within patient's tolerance, Monitored during session, Repositioned    Home Living                          Prior Function            PT Goals (current goals can now be found in the care plan section) Acute Rehab PT Goals Patient Stated Goal:  HOME and not SNF PT Goal Formulation: With patient Time For Goal Achievement: 11/16/22 Potential to Achieve Goals: Good Progress towards PT goals: Progressing toward goals    Frequency    7X/week      PT Plan Current plan remains appropriate    Co-evaluation              AM-PAC PT "6 Clicks" Mobility   Outcome Measure  Help needed turning from your back to your side while in a flat bed without using bedrails?: A Little Help needed moving from lying on your back to sitting on the side of a flat bed without using bedrails?: A Little Help needed moving to and from a bed to a chair (including a wheelchair)?: A Little Help needed standing up from a chair using your arms (e.g., wheelchair or bedside chair)?: A Little Help needed to walk in hospital room?: A Little Help needed climbing 3-5 steps with a railing? : A Little 6 Click Score: 18    End of Session Equipment Utilized During Treatment:  Gait belt Activity Tolerance: Patient tolerated treatment well Patient left: with call bell/phone within reach;in bed;with bed alarm set Nurse Communication: Mobility status PT Visit Diagnosis: Unsteadiness on feet (R26.81);Difficulty in walking, not elsewhere classified (R26.2);Pain Pain - Right/Left: Right Pain - part of body: Knee     Time: 3086-5784 PT Time Calculation (min) (ACUTE ONLY): 18 min  Charges:    $Therapeutic Exercise: 8-22 mins PT General Charges $$ ACUTE PT VISIT: 1 Visit                     Jerone Cudmore, PT  Acute Rehab Dept [facility]) [phone]  11/05/2022    [facility] 11/05/2022, 4:29 PM

## 2022-11-05 NOTE — Progress Notes (Signed)
Physical Therapy Treatment Patient Details Name: [name] [name] MRN: [id number] DOB: [birth year] Today's Date: 11/05/2022   History of Present Illness Patient is a 62 year old female who was admitted from home and now s/p R TKR I&D with poly exchange and wound vac placement.  Pt with hx of RA, cervical fusion, and bil TKR with bil revisions including R patellar tendon repair 07/09/22    PT Comments  Pt progressing slowly this date, limited by pain and bleeding from VAC site, pressure dressing applied. WOC nurse present end of session. Continue to follow    If plan is discharge home, recommend the following: A little help with walking and/or transfers;A little help with bathing/dressing/bathroom;Assistance with cooking/housework;Assist for transportation;Help with stairs or ramp for entrance   Can travel by private vehicle        Equipment Recommendations  None recommended by PT    Recommendations for Other Services       Precautions / Restrictions Precautions Precautions: Fall;Knee Precaution Comments: wound vac RLE Restrictions Weight Bearing Restrictions: No Other Position/Activity Restrictions: WBAT     Mobility  Bed Mobility Overal bed mobility: Needs Assistance Bed Mobility: Sit to Supine, Supine to Sit     Supine to sit: Min assist Sit to supine: Min assist   General bed mobility comments: assist with RLE d/t pain    Transfers Overall transfer level: Needs assistance Equipment used: Rolling walker (2 wheels) Transfers: Sit to/from Stand Sit to Stand: Min assist           General transfer comment: assist to rise and stabilize, decr wt on RLE d/t pain    Ambulation/Gait    Lateral steps along EOB with min assist, limited by pain               Stairs             Wheelchair Mobility     Tilt Bed    Modified Rankin (Stroke Patients Only)       Balance                                            Cognition  Arousal/Alertness: Awake/alert Behavior During Therapy: WFL for tasks assessed/performed Overall Cognitive Status: Within Functional Limits for tasks assessed                                 General Comments: VAC turned off and wound bleeding        Exercises      General Comments        Pertinent Vitals/Pain Pain Assessment Pain Assessment: 0-10 Pain Score: 9  Pain Location: R knee Pain Descriptors / Indicators: Aching, Sore Pain Intervention(s): Limited activity within patient's tolerance, Monitored during session, Premedicated before session, Repositioned, Ice applied    Home Living                          Prior Function            PT Goals (current goals can now be found in the care plan section) Acute Rehab PT Goals Patient Stated Goal: HOME and not SNF PT Goal Formulation: With patient Time For Goal Achievement: 11/16/22 Potential to Achieve Goals: Good Progress towards PT goals: Progressing toward goals    Frequency  7X/week      PT Plan Current plan remains appropriate    Co-evaluation              AM-PAC PT "6 Clicks" Mobility   Outcome Measure  Help needed turning from your back to your side while in a flat bed without using bedrails?: A Little Help needed moving from lying on your back to sitting on the side of a flat bed without using bedrails?: A Little Help needed moving to and from a bed to a chair (including a wheelchair)?: A Little Help needed standing up from a chair using your arms (e.g., wheelchair or bedside chair)?: A Little Help needed to walk in hospital room?: A Little Help needed climbing 3-5 steps with a railing? : A Little 6 Click Score: 18    End of Session Equipment Utilized During Treatment: Gait belt Activity Tolerance: Patient tolerated treatment well Patient left: with call bell/phone within reach;in bed;with bed alarm set   PT Visit Diagnosis: Unsteadiness on feet (R26.81);Difficulty  in walking, not elsewhere classified (R26.2);Pain Pain - Right/Left: Right Pain - part of body: Knee     Time: 1139-1203 PT Time Calculation (min) (ACUTE ONLY): 24 min  Charges:    $Gait Training: 8-22 mins $Therapeutic Activity: 8-22 mins PT General Charges $$ ACUTE PT VISIT: 1 Visit                     Maeci Kalbfleisch, PT  Acute Rehab Dept [facility]) [phone]  11/05/2022    [facility] 11/05/2022, 12:16 PM

## 2022-11-05 NOTE — Consult Note (Signed)
WOC Nurse Consult Note: Reason for Consult: NPWT dressing, right knee; S/P I&D; poly exchange  Wound type: surgical  Pressure Injury POA: NA Measurement:22cm incision with staples, one open area distal incision that is 1.5cm x 2.0cm x 0.5cm  Today found with large clot at the distal aspect of the dressing, however when I removed the dressing and removed the clot no active bleeding noted.  No active bleeding when I reapplied the suction.   Wound bed: Drainage (amount, consistency, odor)  Periwound: Dressing procedure/placement/frequency: Removed old NPWT dressing Black foam was directly over surgical incision/staple, I have protected the surgical incision with a contact layer (Mepitel) to prevent any harm to the surrounding tissues.  Filled wound with  _1__ piece of black foam,  Sealed NPWT dressing at HG Patient received PO pain medication per bedside nurse prior to dressing change Patient tolerated procedure well  Notified staff to contact orthopedics should she have any further bleeding, including saturating the dressings, strike through or filling the canister with blood.   Menachem Urbanek [facility], CNS, [employer] [phone]

## 2022-11-05 NOTE — Progress Notes (Signed)
[facility]  Date of Admission:  11/01/2022   Total days of inpatient antibiotics 3     Principal Problem:   Failed total knee arthroplasty [facility]) Active Problems:   History of total knee arthroplasty, right          Assessment: 62 year old female with history of neuromuscular disorder, rheumatoid arthritis on methotrexate, complex history regarding right knee admitted with:   #Right knee PJI status post I&D and polyethylene exchange with wound VAC placement 7/27 - Patient had undergone TKA revision 4 months ago postop course was complicated by increased pain 2 weeks later.  She underwent polyethylene revision and repair of patellar  tendon rupture on 07/08/2022 complicated by stitch abscess which was healing until 2 weeks ago.  Admitted for right knee I&D and poly exchange, OR cultures grew Pseudomonas aeruginosa and Morganella Morganii.  Infectious disease engaged. -On review looks like Keflex was prescribed on 718 for a week  Recommendations:  -D/C  cefepime. Start levaquin 750mg  pO qf x 6 weeks EOT 12/12/2022 then transition to 500mg  for chronic suppression.  EKG 11/20/2021 with QTc 448.-Follow-up with myself on 8/27   #RA on MTX and Orencia -Last dose of both meds 2 weeks ago -Followed by Dr. Hawks(Rheumatology) -Would recc holding both while on treatment for PJI, Of not pt will need chronic abx suppression as she prosthesis is retained.  -Would recc f/u with rheum following discharge(discussed with pt). I contacted Rheum in  regards to plan above  ID will sign off Microbiology:   Antibiotics: Cefazolin 7/27-present   Cultures: Other 7/27 Morganella morganii and Pseudomonas aeruginosa  SUBJECTIVE: Resting in bed. Discussed care Interval: Afebrile overnight.   Review of Systems: Review of Systems  All other systems reviewed and are negative.    Scheduled Meds:  aspirin  81 mg Oral BID   citalopram  20 mg Oral Daily   docusate sodium   100 mg Oral BID   levofloxacin  750 mg Oral Daily   mirabegron ER  50 mg Oral Daily   Continuous Infusions:  sodium chloride Stopped (11/02/22 1426)   methocarbamol (ROBAXIN) IV 110 mL/hr at 11/02/22 1643   PRN Meds:.bisacodyl, diphenhydrAMINE, HYDROmorphone (DILAUDID) injection, lip balm, menthol-cetylpyridinium **OR** phenol, methocarbamol **OR** methocarbamol (ROBAXIN) IV, metoCLOPramide **OR** metoCLOPramide (REGLAN) injection, ondansetron **OR** ondansetron (ZOFRAN) IV, oxyCODONE, oxyCODONE, polyethylene glycol, silver nitrate applicators, sodium phosphate Allergies  Allergen Reactions   Sulfa Antibiotics Shortness Of Breath   Morphine Rash   Penicillins Rash    Tolerated Cephalosporin Date: 12/05/21.    OBJECTIVE: Vitals:   11/04/22 1320 11/04/22 2150 11/05/22 0523 11/05/22 1032  BP: 111/65 130/71 (!) 140/84 104/61  Pulse: 92 95 85 90  Resp: 17 15 16 15   Temp: 98.6 F (37 C) 99 F (37.2 C) 98.6 F (37 C)   TempSrc: Oral     SpO2: 98% 98% 98% 97%  Weight:      Height:       Body mass index is 29.8 kg/m.  Physical Exam Constitutional:      Appearance: Normal appearance.  HENT:     Head: Normocephalic and atraumatic.     Right Ear: Tympanic membrane normal.     Left Ear: Tympanic membrane normal.     Nose: Nose normal.     Mouth/Throat:     Mouth: Mucous membranes are moist.  Eyes:     Extraocular Movements: Extraocular movements intact.     Conjunctiva/sclera: Conjunctivae normal.  Pupils: Pupils are equal, round, and reactive to light.  Cardiovascular:     Rate and Rhythm: Normal rate and regular rhythm.     Heart sounds: No murmur heard.    No friction rub. No gallop.  Pulmonary:     Effort: Pulmonary effort is normal.     Breath sounds: Normal breath sounds.  Abdominal:     General: Abdomen is flat.     Palpations: Abdomen is soft.  Musculoskeletal:     Comments: Right knee with wound vac  Skin:    General: Skin is warm and dry.  Neurological:      General: No focal deficit present.     Mental Status: She is alert and oriented to person, place, and time.  Psychiatric:        Mood and Affect: Mood normal.       Lab Results Lab Results  Component Value Date   WBC 11.3 (H) 11/03/2022   HGB 9.9 (L) 11/03/2022   HCT 32.2 (L) 11/03/2022   MCV 82.6 11/03/2022   PLT 358 11/03/2022    Lab Results  Component Value Date   CREATININE 0.58 11/02/2022   BUN 10 11/02/2022   NA 135 11/02/2022   K 3.6 11/02/2022   CL 104 11/02/2022   CO2 24 11/02/2022    Lab Results  Component Value Date   ALT 11 10/30/2020   AST 15 10/30/2020   ALKPHOS 83 10/18/2016   BILITOT 0.5 10/30/2020        Danelle Earthly, MD [facility] 11/05/2022, 1:38 PM I have personally spent 52 minutes involved in face-to-face and non-face-to-face activities for this patient on the day of the visit. Professional time spent includes the following activities: Preparing to see the patient (review of tests), Obtaining and/or reviewing separately obtained history (admission/discharge record), Performing a medically appropriate examination and/or evaluation , Ordering medications/tests/procedures, referring and communicating with other health care professionals, Documenting clinical information in the EMR, Independently interpreting results (not separately reported), Communicating results to the patient/family/caregiver, Counseling and educating the patient/family/caregiver and Care coordination (not separately reported).

## 2022-11-05 NOTE — Progress Notes (Signed)
Patient up to bathroom, right knee wound vac dressing with blood running down leg from distal portion of vac dressing. Pressure dressing applied and assisted patient back to bed x 2 assist. Vital signs taken. Wound nurse on floor and made aware.  Stated she wound change the VAC dressing shortly. Sharrell Ku RN

## 2022-11-06 LAB — AEROBIC/ANAEROBIC CULTURE W GRAM STAIN (SURGICAL/DEEP WOUND): Gram Stain: NONE SEEN

## 2022-11-06 NOTE — Progress Notes (Addendum)
Physical Therapy Treatment Patient Details Name: [name] [name] MRN: [id number] DOB: 1960-08-25 Today's Date: 11/06/2022   History of Present Illness Patient is a 62 year old female who was admitted from home and now s/p R TKR I&D with poly exchange and wound vac placement.  Pt with hx of RA, cervical fusion, and bil TKR with bil revisions including R patellar tendon repair 07/09/22    PT Comments  Pt progressing slowly however did amb short distance this am. Pt able to stand from [facility] and perfom her own pericare.  Have encouraged pt to remain off purewick and mobilize as much as possible since the plan is to d/c home tomorrow; nursing staff states this is not a reasonable goal as they do not have staffing to get pt to BSC/accommodate pt needs.    If plan is discharge home, recommend the following: A little help with walking and/or transfers;A little help with bathing/dressing/bathroom;Assistance with cooking/housework;Assist for transportation;Help with stairs or ramp for entrance   Can travel by private vehicle        Equipment Recommendations  None recommended by PT    Recommendations for Other Services       Precautions / Restrictions Precautions Precautions: Fall;Knee Precaution Comments: wound vac RLE Restrictions Weight Bearing Restrictions: No Other Position/Activity Restrictions: WBAT     Mobility  Bed Mobility               General bed mobility comments: pt on BSC on arrival    Transfers Overall transfer level: Needs assistance Equipment used: Rolling walker (2 wheels) Transfers: Sit to/from Stand Sit to Stand: Min assist           General transfer comment: pt performs STS x2, prefers to pull up on RW assist stabilize RW, decr wt on RLE d/t pain    Ambulation/Gait Ambulation/Gait assistance: Min guard, +2 safety/equipment Gait Distance (Feet): 18 Feet Assistive device: Bilateral platform walker Gait Pattern/deviations: Step-to pattern,  Decreased step length - right, Decreased step length - left, Trunk flexed, Decreased stance time - right       General Gait Details: cues for sequence, posture and position from RW; min/guard for safety, pt fatigues easily no overt LOB   Stairs             Wheelchair Mobility     Tilt Bed    Modified Rankin (Stroke Patients Only)       Balance   Sitting-balance support: No upper extremity supported, Feet supported Sitting balance-Leahy Scale: Good     Standing balance support: Single extremity supported, During functional activity, Reliant on assistive device for balance Standing balance-Leahy Scale: Fair Standing balance comment: pt is able to stand and perform her own pericare with set up                            Cognition Arousal/Alertness: Awake/alert Behavior During Therapy: WFL for tasks assessed/performed Overall Cognitive Status: Within Functional Limits for tasks assessed                                          Exercises Total Joint Exercises Ankle Circles/Pumps: AROM, Both, 10 reps Heel Slides: AAROM, Both, 10 reps    General Comments General comments (skin integrity, edema, etc.): RLE positioned to promote neutral hip and terminal knee extension      Pertinent  Vitals/Pain Pain Assessment Pain Assessment: 0-10 Pain Score: 5  Pain Location: R knee Pain Descriptors / Indicators: Aching, Sore Pain Intervention(s): Limited activity within patient's tolerance, Monitored during session, Premedicated before session, Repositioned, Ice applied    Home Living                          Prior Function            PT Goals (current goals can now be found in the care plan section) Acute Rehab PT Goals Patient Stated Goal: HOME and not SNF PT Goal Formulation: With patient Time For Goal Achievement: 11/16/22 Potential to Achieve Goals: Good Progress towards PT goals: Progressing toward goals     Frequency    7X/week      PT Plan Current plan remains appropriate    Co-evaluation              AM-PAC PT "6 Clicks" Mobility   Outcome Measure  Help needed turning from your back to your side while in a flat bed without using bedrails?: A Little Help needed moving from lying on your back to sitting on the side of a flat bed without using bedrails?: A Little Help needed moving to and from a bed to a chair (including a wheelchair)?: A Little Help needed standing up from a chair using your arms (e.g., wheelchair or bedside chair)?: A Little Help needed to walk in hospital room?: A Little Help needed climbing 3-5 steps with a railing? : A Little 6 Click Score: 18    End of Session Equipment Utilized During Treatment: Gait belt Activity Tolerance: Patient tolerated treatment well Patient left: with call bell/phone within reach;in bed;with bed alarm set Nurse Communication: Mobility status PT Visit Diagnosis: Unsteadiness on feet (R26.81);Difficulty in walking, not elsewhere classified (R26.2);Pain Pain - Right/Left: Right Pain - part of body: Knee     Time: 0981-1914 PT Time Calculation (min) (ACUTE ONLY): 18 min  Charges:    $Gait Training: 8-22 mins PT General Charges $$ ACUTE PT VISIT: 1 Visit                     Braelon Sprung, PT  Acute Rehab Dept [facility]) [phone]  11/06/2022    [facility] 11/06/2022, 10:48 AM

## 2022-11-06 NOTE — Progress Notes (Signed)
Physical Therapy Treatment Patient Details Name: [name] [name] MRN: [id number] DOB: 1960/06/30 Today's Date: 11/06/2022   History of Present Illness Patient is a 62 year old female who was admitted from home and now s/p R TKR I&D with poly exchange and wound vac placement.  Pt with hx of RA, cervical fusion, and bil TKR with bil revisions including R patellar tendon repair 07/09/22    PT Comments  Pt making good progress this session. Incr tol to activity, tolerating chair to [facility]  transfers, LE exercise and amb x 100'. Pt to determine need for stair practice tomorrow; pt having son bump her up 2 separated steps in w/c should be a viable option   If plan is discharge home, recommend the following: A little help with walking and/or transfers;A little help with bathing/dressing/bathroom;Assistance with cooking/housework;Assist for transportation;Help with stairs or ramp for entrance   Can travel by private vehicle        Equipment Recommendations  None recommended by PT    Recommendations for Other Services       Precautions / Restrictions Precautions Precautions: Fall;Knee Precaution Comments: wound vac RLE Restrictions Weight Bearing Restrictions: No Other Position/Activity Restrictions: WBAT     Mobility  Bed Mobility Overal bed mobility: Needs Assistance         Sit to supine: Supervision   General bed mobility comments: with incr time pt able to lift RLE on to bed with gait belt    Transfers Overall transfer level: Needs assistance Equipment used: Rolling walker (2 wheels) Transfers: Sit to/from Stand, Bed to chair/wheelchair/BSC Sit to Stand: Supervision, Min guard   Step pivot transfers: Min guard, Supervision       General transfer comment: improved effort with STS, incr time and cues for hand placement; incr knee flexion noted on R for STS transitions    Ambulation/Gait Ambulation/Gait assistance: Min guard Gait Distance (Feet): 100 Feet Assistive  device: Bilateral platform walker Gait Pattern/deviations: Step-to pattern, Decreased step length - right, Decreased step length - left, Trunk flexed, Decreased stance time - right       General Gait Details: cues for sequence, posture and position from RW and maneuvering RW during turns; min/guard for safety, no overt LOB. 2 brief standing rests   Comptroller Bed    Modified Rankin (Stroke Patients Only)       Balance   Sitting-balance support: No upper extremity supported, Feet supported Sitting balance-Leahy Scale: Good     Standing balance support: Single extremity supported, During functional activity, Reliant on assistive device for balance Standing balance-Leahy Scale: Fair Standing balance comment: pt is able to stand and perform her own pericare with set up                            Cognition Arousal/Alertness: Awake/alert Behavior During Therapy: WFL for tasks assessed/performed Overall Cognitive Status: Within Functional Limits for tasks assessed                                          Exercises Total Joint Exercises Ankle Circles/Pumps: AROM, Both, 10 reps Quad Sets: AROM, Both, 10 reps Heel Slides: AAROM, Both, 10 reps    General Comments        Pertinent Vitals/Pain  Pain Assessment Pain Assessment: 0-10 Pain Score: 3  Pain Location: R knee Pain Descriptors / Indicators: Aching, Sore Pain Intervention(s): Limited activity within patient's tolerance, Monitored during session, Premedicated before session, Repositioned    Home Living                          Prior Function            PT Goals (current goals can now be found in the care plan section) Acute Rehab PT Goals Patient Stated Goal: HOME and not SNF PT Goal Formulation: With patient Time For Goal Achievement: 11/16/22 Potential to Achieve Goals: Good Progress towards PT goals: Progressing toward  goals    Frequency    7X/week      PT Plan Current plan remains appropriate    Co-evaluation              AM-PAC PT "6 Clicks" Mobility   Outcome Measure  Help needed turning from your back to your side while in a flat bed without using bedrails?: A Little Help needed moving from lying on your back to sitting on the side of a flat bed without using bedrails?: A Little Help needed moving to and from a bed to a chair (including a wheelchair)?: A Little Help needed standing up from a chair using your arms (e.g., wheelchair or bedside chair)?: A Little Help needed to walk in hospital room?: A Little Help needed climbing 3-5 steps with a railing? : A Little 6 Click Score: 18    End of Session Equipment Utilized During Treatment: Gait belt Activity Tolerance: Patient tolerated treatment well Patient left: with call bell/phone within reach;in bed;with bed alarm set Nurse Communication: Mobility status PT Visit Diagnosis: Unsteadiness on feet (R26.81);Difficulty in walking, not elsewhere classified (R26.2);Pain Pain - Right/Left: Right Pain - part of body: Knee     Time: 1027-2536 PT Time Calculation (min) (ACUTE ONLY): 55 min  Charges:    $Gait Training: 23-37 mins $Therapeutic Exercise: 8-22 mins $Therapeutic Activity: 8-22 mins PT General Charges $$ ACUTE PT VISIT: 1 Visit                     Zyair Russi, PT  Acute Rehab Dept [facility]) [phone]  11/06/2022    [facility] 11/06/2022, 3:29 PM

## 2022-11-06 NOTE — Plan of Care (Signed)
  Problem: Pain Management: Goal: Pain level will decrease with appropriate interventions Outcome: Progressing   

## 2022-11-06 NOTE — Plan of Care (Signed)
  Problem: Activity: Goal: Ability to avoid complications of mobility impairment will improve Outcome: Progressing Goal: Range of joint motion will improve Outcome: Progressing   Problem: Pain Management: Goal: Pain level will decrease with appropriate interventions Outcome: Progressing   

## 2022-11-06 NOTE — Progress Notes (Signed)
   Subjective: 5 Days Post-Op Procedure(s) (LRB): Right knee irrigation and debridement; poly exchange; wound vac placement (Right) Patient reports pain as mild.   Patient seen in rounds by Dr. Lequita Halt. Patient is doing fair this AM, did not ambulate with therapy yesterday due to fatigue. Plan is to go Home with home health after hospital stay.  Objective: Vital signs in last 24 hours: Temp:  [98 F (36.7 C)-98.7 F (37.1 C)] 98 F (36.7 C) (08/01 0628) Pulse Rate:  [85-98] 85 (08/01 0628) Resp:  [15-16] 16 (08/01 0628) BP: (104-125)/(49-61) 112/58 (08/01 0628) SpO2:  [95 %-99 %] 96 % (08/01 0628)  Intake/Output from previous day:  Intake/Output Summary (Last 24 hours) at 11/06/2022 0741 Last data filed at 11/06/2022 0600 Gross per 24 hour  Intake 240 ml  Output 1750 ml  Net -1510 ml    Intake/Output this shift: No intake/output data recorded.  Labs: No results for input(s): "HGB" in the last 72 hours. No results for input(s): "WBC", "RBC", "HCT", "PLT" in the last 72 hours. No results for input(s): "NA", "K", "CL", "CO2", "BUN", "CREATININE", "GLUCOSE", "CALCIUM" in the last 72 hours. No results for input(s): "LABPT", "INR" in the last 72 hours.  Exam: General - Patient is Alert and Oriented Extremity - Neurologically intact Neurovascular intact Sensation intact distally Dorsiflexion/Plantar flexion intact Dressing/Incision - wound vac in place Motor Function - intact, moving foot and toes well on exam.   Past Medical History:  Diagnosis Date   Complication of anesthesia    "irregular heartbeat" during neck surgery   Depression    Fibroid    Neuromuscular disorder (HCC)    Pre-diabetes    Rheumatoid arthritis (HCC)    Urinary incontinence     Assessment/Plan: 5 Days Post-Op Procedure(s) (LRB): Right knee irrigation and debridement; poly exchange; wound vac placement (Right) Principal Problem:   Failed total knee arthroplasty (HCC) Active Problems:    History of total knee arthroplasty, right  Estimated body mass index is 29.8 kg/m as calculated from the following:   Height as of this encounter: 5\' 2"  (1.575 m).   Weight as of this encounter: 73.9 kg. Up with therapy  DVT Prophylaxis - Aspirin Weight-bearing as tolerated  Plan for discharge with HHPT tomorrow  Arther Abbott, PA-C Orthopedic Surgery [phone] 11/06/2022, 7:41 AM

## 2022-11-07 MED ORDER — ASPIRIN 81 MG PO CHEW: 81.0000 mg | CHEWABLE_TABLET | Freq: Two times a day (BID) | ORAL | 0 refills | Status: AC

## 2022-11-07 MED ORDER — METHOCARBAMOL 500 MG PO TABS: 500.0000 mg | ORAL_TABLET | Freq: Four times a day (QID) | ORAL | 0 refills | Status: DC | PRN

## 2022-11-07 MED ORDER — ONDANSETRON HCL 4 MG PO TABS: 4.0000 mg | ORAL_TABLET | Freq: Four times a day (QID) | ORAL | 0 refills | Status: DC | PRN

## 2022-11-07 MED ORDER — OXYCODONE HCL 5 MG PO TABS: 5.0000 mg | ORAL_TABLET | Freq: Four times a day (QID) | ORAL | 0 refills | Status: DC | PRN

## 2022-11-07 MED ORDER — LEVOFLOXACIN 750 MG PO TABS: 750.0000 mg | ORAL_TABLET | Freq: Every day | ORAL | 0 refills | Status: AC

## 2022-11-07 NOTE — Care Management Important Message (Signed)
Important Message  Patient Details IM Letter given. Name: [name] [name] MRN: [id number] Date of Birth: 03-Apr-1961   Medicare Important Message Given:  Yes     Caren Macadam 11/07/2022, 11:40 AM

## 2022-11-07 NOTE — TOC Transition Note (Signed)
Transition of Care [facility]) - CM/SW Discharge Note   Patient Details  Name: [name] [name] MRN: [id number] Date of Birth: 07-23-1960  Transition of Care [facility]) CM/SW Contact:  Amada Jupiter, LCSW Phone Number: 11/07/2022, 1:57 PM   Clinical Narrative:    Pt medically cleared for dc home today.  [employer] to deliver home [facility] system to room.  [facility] is set up to start [facility] services.  Son to provide dc transportation.  No further TOC needs.   Final next level of care: Home w Home Health Services Barriers to Discharge: Barriers Resolved   Patient Goals and CMS Choice CMS Medicare.gov Compare Post Acute Care list provided to:: Patient Choice offered to / list presented to : Patient  Discharge Placement                         Discharge Plan and Services Additional resources added to the After Visit Summary for   In-house Referral: Clinical Social Work   Post Acute Care Choice: Home Health          DME Arranged: Negative pressure wound device DME Agency: [employer] Date DME Agency Contacted: 11/04/22   Representative spoke with at DME Agency: [facility] HH Arranged: RN, PT, Nurse's Aide HH Agency: Other - See comment Cindie Laroche) Date HH Agency Contacted: 11/04/22   Representative spoke with at [facility] Agency: Marylene Land  Social Determinants of Health (SDOH) Interventions SDOH Screenings   Food Insecurity: Food Insecurity Present (11/01/2022)  Housing: Low Risk  (11/01/2022)  Transportation Needs: No Transportation Needs (11/01/2022)  Utilities: Not At Risk (11/01/2022)  Tobacco Use: Low Risk  (11/01/2022)     Readmission Risk Interventions    07/14/2022    3:11 PM  Readmission Risk Prevention Plan  Post Dischage Appt Complete  Medication Screening Complete  Transportation Screening Complete

## 2022-11-07 NOTE — Progress Notes (Signed)
Physical Therapy Treatment Patient Details Name: [name] [name] MRN: [id number] DOB: 11/12/1960 Today's Date: 11/07/2022   History of Present Illness Patient is a 62 year old female who was admitted from home and now s/p R TKR I&D with poly exchange and wound vac placement.  Pt with hx of RA, cervical fusion, and bil TKR with bil revisions including R patellar tendon repair 07/09/22    PT Comments  Pt is progressing well with mobility, she ambulated 160' with RW, no loss of balance. Issued handout for technique for bumping up/down steps with a WC, pt stated her son and a neighbor can provide assistance for that.  She is ready to DC home from a PT standpoint.    If plan is discharge home, recommend the following: A little help with walking and/or transfers;A little help with bathing/dressing/bathroom;Assistance with cooking/housework;Assist for transportation;Help with stairs or ramp for entrance   Can travel by private vehicle        Equipment Recommendations  None recommended by PT    Recommendations for Other Services       Precautions / Restrictions Precautions Precautions: Fall;Knee Precaution Comments: wound vac RLE Restrictions Weight Bearing Restrictions: No Other Position/Activity Restrictions: WBAT     Mobility  Bed Mobility Overal bed mobility: Modified Independent Bed Mobility: Supine to Sit     Supine to sit: Modified independent (Device/Increase time), HOB elevated     General bed mobility comments: increased time, used rail, HOB up, used gait beld as RLE lifter    Transfers Overall transfer level: Needs assistance Equipment used: Rolling walker (2 wheels) Transfers: Sit to/from Stand Sit to Stand: Supervision           General transfer comment: VCs    Ambulation/Gait Ambulation/Gait assistance: Min guard Gait Distance (Feet): 160 Feet Assistive device: Bilateral platform walker Gait Pattern/deviations: Step-to pattern, Decreased step  length - right, Decreased step length - left, Trunk flexed, Decreased stance time - right Gait velocity: decr     General Gait Details: steady, no loss of balance   Stairs             Wheelchair Mobility     Tilt Bed    Modified Rankin (Stroke Patients Only)       Balance   Sitting-balance support: No upper extremity supported, Feet supported Sitting balance-Leahy Scale: Good     Standing balance support: Single extremity supported, During functional activity, Reliant on assistive device for balance Standing balance-Leahy Scale: Fair Standing balance comment: pt is able to stand and perform her own pericare with set up                            Cognition Arousal/Alertness: Awake/alert Behavior During Therapy: WFL for tasks assessed/performed Overall Cognitive Status: Within Functional Limits for tasks assessed                                          Exercises Total Joint Exercises Ankle Circles/Pumps: AROM, Both, 10 reps, Supine Quad Sets: AROM, Both, 10 reps, Supine    General Comments        Pertinent Vitals/Pain Pain Assessment Pain Score: 1  Pain Location: R knee Pain Descriptors / Indicators: Aching, Sore Pain Intervention(s): Limited activity within patient's tolerance, Monitored during session, Premedicated before session, Ice applied    Home Living  Prior Function            PT Goals (current goals can now be found in the care plan section) Acute Rehab PT Goals Patient Stated Goal: HOME and not SNF PT Goal Formulation: With patient Time For Goal Achievement: 11/16/22 Potential to Achieve Goals: Good Progress towards PT goals: Progressing toward goals    Frequency    7X/week      PT Plan Current plan remains appropriate    Co-evaluation              AM-PAC PT "6 Clicks" Mobility   Outcome Measure  Help needed turning from your back to your side while in  a flat bed without using bedrails?: A Little Help needed moving from lying on your back to sitting on the side of a flat bed without using bedrails?: A Little Help needed moving to and from a bed to a chair (including a wheelchair)?: A Little Help needed standing up from a chair using your arms (e.g., wheelchair or bedside chair)?: A Little Help needed to walk in hospital room?: A Little Help needed climbing 3-5 steps with a railing? : A Little 6 Click Score: 18    End of Session Equipment Utilized During Treatment: Gait belt Activity Tolerance: Patient tolerated treatment well Patient left: with call bell/phone within reach;in chair;with chair alarm set Nurse Communication: Mobility status PT Visit Diagnosis: Unsteadiness on feet (R26.81);Difficulty in walking, not elsewhere classified (R26.2);Pain Pain - Right/Left: Right Pain - part of body: Knee     Time: 1110-1150 PT Time Calculation (min) (ACUTE ONLY): 40 min  Charges:    $Gait Training: 8-22 mins $Therapeutic Exercise: 8-22 mins $Therapeutic Activity: 8-22 mins PT General Charges $$ ACUTE PT VISIT: 1 Visit                     Tamala Ser PT 11/07/2022  Acute Rehabilitation Services  Office [phone]

## 2022-11-07 NOTE — Progress Notes (Signed)
   Subjective: 6 Days Post-Op Procedure(s) (LRB): Right knee irrigation and debridement; poly exchange; wound vac placement (Right) Patient reports pain as mild.   Patient seen in rounds for Dr. Lequita Halt. Patient is well, and has had no acute complaints or problems. No issues overnight.  Plan is to go Home after hospital stay.  Objective: Vital signs in last 24 hours: Temp:  [98 F (36.7 C)-98.4 F (36.9 C)] 98 F (36.7 C) (08/02 0612) Pulse Rate:  [73-93] 73 (08/02 0612) Resp:  [14-18] 14 (08/02 0612) BP: (103-110)/(51-66) 110/62 (08/02 0612) SpO2:  [98 %-99 %] 98 % (08/02 0612)  Intake/Output from previous day:  Intake/Output Summary (Last 24 hours) at 11/07/2022 0804 Last data filed at 11/07/2022 0721 Gross per 24 hour  Intake 600 ml  Output 450 ml  Net 150 ml    Intake/Output this shift: No intake/output data recorded.  Labs: No results for input(s): "HGB" in the last 72 hours. No results for input(s): "WBC", "RBC", "HCT", "PLT" in the last 72 hours. No results for input(s): "NA", "K", "CL", "CO2", "BUN", "CREATININE", "GLUCOSE", "CALCIUM" in the last 72 hours. No results for input(s): "LABPT", "INR" in the last 72 hours.  Exam: General - Patient is Alert and Oriented Extremity - Neurologically intact Neurovascular intact Sensation intact distally Dorsiflexion/Plantar flexion intact Dressing/Incision - clean, dry, no drainage Motor Function - intact, moving foot and toes well on exam.   Past Medical History:  Diagnosis Date   Complication of anesthesia    "irregular heartbeat" during neck surgery   Depression    Fibroid    Neuromuscular disorder (HCC)    Pre-diabetes    Rheumatoid arthritis (HCC)    Urinary incontinence     Assessment/Plan: 6 Days Post-Op Procedure(s) (LRB): Right knee irrigation and debridement; poly exchange; wound vac placement (Right) Principal Problem:   Failed total knee arthroplasty (HCC) Active Problems:   History of total knee  arthroplasty, right  Estimated body mass index is 29.8 kg/m as calculated from the following:   Height as of this encounter: 5\' 2"  (1.575 m).   Weight as of this encounter: 73.9 kg. Up with therapy  DVT Prophylaxis - Aspirin Weight-bearing as tolerated  Placed order for walker with platform to give her extra stability after discharge to home. Concerns with standard walking tilting due to extra weight from vac.   Plan for discharge to home today with home health.  Arther Abbott, PA-C Orthopedic Surgery [phone] 11/07/2022, 8:04 AM

## 2022-11-07 NOTE — Plan of Care (Signed)
Patient discharged via private vehicle with son. Discharge instructions provided, including management of wound vac machine. AVS provided. Haydee Salter, RN 11/07/22 4:20 PM

## 2022-11-07 NOTE — Consult Note (Signed)
WOC Nurse wound follow up Wound type: closed surgical incision right knee, with one area of superficial skin opening  Measurement:see Wednesday notes Wound bed: Drainage (amount, consistency, odor)  Periwound: Dressing procedure/placement/frequency: Removed old NPWT dressing (2pc black foam)2 pc of Mepitel Filled wound with  _1__ piece of black foam, used 1 pc for incision _3__ piece of Mepitel covering intact staple line  Sealed NPWT dressing at HG Patient received PO pain medication per bedside nurse prior to dressing change Patient tolerated procedure well Plans for DC to home with [facility] for dressing change 2x wk  Would schedule for Tues/Friday or Mon/Thursday   [facility], CNS, CWON-AP [phone]

## 2022-11-07 NOTE — Progress Notes (Signed)
Occupational Therapy Treatment Patient Details Name: [name] [name] MRN: [id number] DOB: January 09, 1961 Today's Date: 11/07/2022   History of present illness Patient is a 62 year old female who was admitted from home and now s/p R TKR I&D with poly exchange and wound vac placement.  Pt with hx of RA, cervical fusion, and bil TKR with bil revisions including R patellar tendon repair 07/09/22   OT comments  This 62 yo female seen today to address any basic ADLs concerns she had. Her only question was about her getting on Depends--educated her on this and she returned demonstrated. Pt reports she will sponge bath until wound vac is discontinued and MD clears her to shower and she feels comfortable using the 3n1 she has at home.    Recommendations for follow up therapy are one component of a multi-disciplinary discharge planning process, led by the attending physician.  Recommendations may be updated based on patient status, additional functional criteria and insurance authorization.    Assistance Recommended at Discharge Frequent or constant Supervision/Assistance  Patient can return home with the following  A little help with walking and/or transfers;A little help with bathing/dressing/bathroom;Assistance with cooking/housework;Assist for transportation;Help with stairs or ramp for entrance   Equipment Recommendations  None recommended by OT       Precautions / Restrictions Precautions Precautions: Fall;Knee Precaution Comments: wound vac RLE Restrictions Weight Bearing Restrictions: No RLE Weight Bearing: Weight bearing as tolerated Other Position/Activity Restrictions: WBAT       Mobility Bed Mobility               General bed mobility comments: Pt up in rcliner upon arrival    Transfers Overall transfer level: Needs assistance Equipment used: Bilateral platform walker Transfers: Sit to/from Stand Sit to Stand: Min guard                 Balance Overall balance  assessment: Needs assistance Sitting-balance support: No upper extremity supported, Feet supported Sitting balance-Leahy Scale: Good     Standing balance support: No upper extremity supported, During functional activity Standing balance-Leahy Scale: Fair Standing balance comment: standing to pull up Depends                           ADL either performed or assessed with clinical judgement   ADL Overall ADL's : Needs assistance/impaired                     Lower Body Dressing: Min guard;Sit to/from stand Lower Body Dressing Details (indicate cue type and reason): putting on a Depends while seated in recliner in long sitting (reports she will be putting them on while in bed at home. Toilet Transfer: Hydrographic surveyor Details (indicate cue type and reason): Bil PFRW sit<>stand from recliner>steps forward>3 steps back Toileting- Architect and Hygiene: Min guard;Sit to/from stand Toileting - Clothing Manipulation Details (indicate cue type and reason): can pull up Depends in standing       General ADL Comments: Pt inquiring about how she was to get her Depends on with wound vac. Explained to her that the large wound vac will be replaced with small wound vac before she leaves and she will be able to either thread the wound vac down through her Depends or she can just pull her Depends up over the cord of the wound vac. Also that the portable wound vac will have strap she can wear around her neck or  crossbody over chest.    Extremity/Trunk Assessment Upper Extremity Assessment Upper Extremity Assessment:  (Bil RA)            Vision Baseline Vision/History: 1 Wears glasses Patient Visual Report: No change from baseline            Cognition Arousal/Alertness: Awake/alert Behavior During Therapy: WFL for tasks assessed/performed Overall Cognitive Status: Within Functional Limits for tasks assessed                                                      Pertinent Vitals/ Pain       Pain Assessment Pain Assessment: 0-10 Pain Score: 1  Pain Location: R knee Pain Descriptors / Indicators: Aching, Sore Pain Intervention(s): Limited activity within patient's tolerance, Monitored during session, Repositioned, Ice applied         Frequency  Min 1X/week        Progress Toward Goals  OT Goals(current goals can now be found in the care plan section)  Progress towards OT goals: Progressing toward goals  Acute Rehab OT Goals Patient Stated Goal: to go home OT Goal Formulation: With patient Time For Goal Achievement: 11/17/22 Potential to Achieve Goals: Fair  Plan Discharge plan needs to be updated       AM-PAC OT "6 Clicks" Daily Activity     Outcome Measure   Help from another person eating meals?: None Help from another person taking care of personal grooming?: A Little Help from another person toileting, which includes using toliet, bedpan, or urinal?: A Little Help from another person bathing (including washing, rinsing, drying)?: A Little Help from another person to put on and taking off regular upper body clothing?: A Little Help from another person to put on and taking off regular lower body clothing?: A Little 6 Click Score: 19    End of Session Equipment Utilized During Treatment:  (BIL PFRW)  OT Visit Diagnosis: Unsteadiness on feet (R26.81);Other abnormalities of gait and mobility (R26.89);Pain Pain - Right/Left: Right Pain - part of body: Knee   Activity Tolerance Patient tolerated treatment well   Patient Left in chair;with call bell/phone within reach;with chair alarm set           Time: 5409-8119 OT Time Calculation (min): 23 min  Charges: OT General Charges $OT Visit: 1 Visit OT Treatments $Self Care/Home Management : 23-37 mins  Lindon Romp OT Acute Rehabilitation Services Office [phone]    Evette Georges 11/07/2022, 2:05 PM

## 2022-11-17 NOTE — Discharge Summary (Signed)
Patient ID: [name] [name] MRN: [id number] DOB/AGE: [birth year] 60 y.o.  Admit date: 11/01/2022 Discharge date: 11/07/2022  Admission Diagnoses:  Principal Problem:   Failed total knee arthroplasty [facility]) Active Problems:   History of total knee arthroplasty, right   Discharge Diagnoses:  Same  Past Medical History:  Diagnosis Date   Complication of anesthesia    "irregular heartbeat" during neck surgery   Depression    Fibroid    Neuromuscular disorder (HCC)    Pre-diabetes    Rheumatoid arthritis (HCC)    Urinary incontinence     Surgeries: Procedure(s): Right knee irrigation and debridement; poly exchange; wound vac placement on 11/01/2022   Consultants:   Discharged Condition: Improved  Hospital Course: [name] [name] is an 62 y.o. female who was admitted 11/01/2022 for operative treatment ofFailed total knee arthroplasty (HCC). Patient has severe unremitting pain that affects sleep, daily activities, and work/hobbies. After pre-op clearance the patient was taken to the operating room on 11/01/2022 and underwent  Procedure(s): Right knee irrigation and debridement; poly exchange; wound vac placement.    Patient was given perioperative antibiotics:  Anti-infectives (From admission, onward)    Start     Dose/Rate Route Frequency Ordered Stop   11/07/22 0000  levofloxacin (LEVAQUIN) 750 MG tablet        750 mg Oral Daily 11/07/22 0808 12/12/22 2359   11/05/22 1400  levofloxacin (LEVAQUIN) tablet 750 mg  Status:  Discontinued        750 mg Oral Daily 11/05/22 0951 11/07/22 2127   11/03/22 1800  ceFEPIme (MAXIPIME) 2 g in sodium chloride 0.9 % 100 mL IVPB  Status:  Discontinued        2 g 200 mL/hr over 30 Minutes Intravenous Every 8 hours 11/03/22 1553 11/05/22 0951   11/01/22 1400  ceFAZolin (ANCEF) IVPB 2g/100 mL premix  Status:  Discontinued        2 g 200 mL/hr over 30 Minutes Intravenous Every 6 hours 11/01/22 0934 11/01/22 1204   11/01/22 1400   ceFAZolin (ANCEF) IVPB 2g/100 mL premix  Status:  Discontinued        2 g 200 mL/hr over 30 Minutes Intravenous Every 8 hours 11/01/22 1204 11/03/22 1553   11/01/22 0636  ceFAZolin (ANCEF) 2-4 GM/100ML-% IVPB       Note to Pharmacy: Nolon Nations S: cabinet override      11/01/22 0636 11/01/22 0821   11/01/22 0600  ceFAZolin (ANCEF) IVPB 2g/100 mL premix        2 g 200 mL/hr over 30 Minutes Intravenous On call to O.R. 11/01/22 0865 11/01/22 7846        Patient was given sequential compression devices, early ambulation, and chemoprophylaxis to prevent DVT.  Patient benefited maximally from hospital stay and there were no complications.    Recent vital signs: No data found.   Recent laboratory studies: No results for input(s): "WBC", "HGB", "HCT", "PLT", "NA", "K", "CL", "CO2", "BUN", "CREATININE", "GLUCOSE", "INR", "CALCIUM" in the last 72 hours.  Invalid input(s): "PT", "2"   Discharge Medications:   Allergies as of 11/07/2022       Reactions   Sulfa Antibiotics Shortness Of Breath   Morphine Rash   Penicillins Rash   Tolerated Cephalosporin Date: 12/05/21. Tolerated Cephalosporin Date: 11/07/22.        Medication List     TAKE these medications    acetaminophen 500 MG tablet Commonly known as: TYLENOL Take 1,000 mg by mouth every 6 (six) hours as  needed for pain.   aspirin 81 MG chewable tablet Chew 1 tablet (81 mg total) by mouth 2 (two) times daily for 20 days. Then take one 81 mg aspirin once a day for three weeks. Then discontinue aspirin.   CALCIUM + D PO Take 1 capsule by mouth daily.   citalopram 20 MG tablet Commonly known as: CeleXA Take 1 tablet (20 mg total) by mouth daily.   cyanocobalamin 1000 MCG tablet Commonly known as: VITAMIN B12 Take 1,000 mcg by mouth daily.   Fish Oil 1200 MG Caps Take 1,200 mg by mouth daily.   folic acid 1 MG tablet Commonly known as: FOLVITE Take 2 mg by mouth daily.   levofloxacin 750 MG tablet Commonly known as:  LEVAQUIN Take 1 tablet (750 mg total) by mouth daily.   methocarbamol 500 MG tablet Commonly known as: ROBAXIN Take 1 tablet (500 mg total) by mouth every 6 (six) hours as needed for muscle spasms.   methotrexate 2.5 MG tablet Commonly known as: RHEUMATREX TAKE 7 TABLETS(17.5 MG TOTAL) BY MOUTH ONCE A WEEK. PROTECT FROM LIGHT   Myrbetriq 50 MG Tb24 tablet Generic drug: mirabegron ER Take 50 mg by mouth daily.   ondansetron 4 MG tablet Commonly known as: ZOFRAN Take 1 tablet (4 mg total) by mouth every 6 (six) hours as needed for nausea.   Orencia ClickJect 125 MG/ML Soaj Generic drug: Abatacept INJECT ONE CLICKJECT PEN UNDER THE SKIN EVERY WEEK. REFRIGERATE. ALLOW TO WARM TO ROOM TEMPERATURE PRIOR TO ADMINISTRATION.   oxyCODONE 5 MG immediate release tablet Commonly known as: Oxy IR/ROXICODONE Take 1-2 tablets (5-10 mg total) by mouth every 6 (six) hours as needed for severe pain.   VITAMIN D PO Take 1 capsule by mouth daily.   Vitamin E 450 MG (1000 UT) Caps Take 450 mg by mouth daily.               Discharge Care Instructions  (From admission, onward)           Start     Ordered   11/07/22 0000  Weight bearing as tolerated        11/07/22 0808   11/07/22 0000  Change dressing       Comments: You may remove the bulky bandage (ACE wrap and gauze) two days after surgery. You will have an adhesive waterproof bandage underneath. Leave this in place until your first follow-up appointment.   11/07/22 0808            Diagnostic Studies: No results found.  Disposition: Discharge disposition: 01-Home or Self Care       Discharge Instructions     Call MD / Call 911   Complete by: As directed    If you experience chest pain or shortness of breath, CALL 911 and be transported to the hospital emergency room.  If you develope a fever above 101 F, pus (white drainage) or increased drainage or redness at the wound, or calf pain, call your surgeon's office.    Change dressing   Complete by: As directed    You may remove the bulky bandage (ACE wrap and gauze) two days after surgery. You will have an adhesive waterproof bandage underneath. Leave this in place until your first follow-up appointment.   Constipation Prevention   Complete by: As directed    Drink plenty of fluids.  Prune juice may be helpful.  You may use a stool softener, such as Colace (over the counter) 100 mg twice  a day.  Use MiraLax (over the counter) for constipation as needed.   Diet - low sodium heart healthy   Complete by: As directed    Do not put a pillow under the knee. Place it under the heel.   Complete by: As directed    Driving restrictions   Complete by: As directed    No driving for two weeks   Post-operative opioid taper instructions:   Complete by: As directed    POST-OPERATIVE OPIOID TAPER INSTRUCTIONS: It is important to wean off of your opioid medication as soon as possible. If you do not need pain medication after your surgery it is ok to stop day one. Opioids include: Codeine, Hydrocodone(Norco, Vicodin), Oxycodone(Percocet, oxycontin) and hydromorphone amongst others.  Long term and even short term use of opiods can cause: Increased pain response Dependence Constipation Depression Respiratory depression And more.  Withdrawal symptoms can include Flu like symptoms Nausea, vomiting And more Techniques to manage these symptoms Hydrate well Eat regular healthy meals Stay active Use relaxation techniques(deep breathing, meditating, yoga) Do Not substitute Alcohol to help with tapering If you have been on opioids for less than two weeks and do not have pain than it is ok to stop all together.  Plan to wean off of opioids This plan should start within one week post op of your joint replacement. Maintain the same interval or time between taking each dose and first decrease the dose.  Cut the total daily intake of opioids by one tablet each day Next  start to increase the time between doses. The last dose that should be eliminated is the evening dose.      TED hose   Complete by: As directed    Use stockings (TED hose) for three weeks on both leg(s).  You may remove them at night for sleeping.   Weight bearing as tolerated   Complete by: As directed           Signed: Arther Abbott 11/17/2022, 8:23 AM

## 2022-11-19 ENCOUNTER — Ambulatory Visit (HOSPITAL_BASED_OUTPATIENT_CLINIC_OR_DEPARTMENT_OTHER): Payer: No Typology Code available for payment source | Admitting: General Surgery

## 2022-12-02 ENCOUNTER — Inpatient Hospital Stay: Payer: No Typology Code available for payment source | Admitting: Internal Medicine

## 2022-12-02 NOTE — Progress Notes (Deleted)
Patient Active Problem List   Diagnosis Date Noted   History of total knee arthroplasty, right 11/01/2022   Painful total knee replacement, right (HCC) 07/08/2022   Failed total knee arthroplasty (HCC) 12/04/2021   Failed total left knee replacement (HCC) 12/04/2021   Status post total left knee replacement 02/28/2019   Chronic pain of left knee 02/28/2019   Status post total knee replacement, bilateral 12/01/2016   Status post bilateral foot surgery 12/01/2016   Contracture of right elbow 06/24/2016   Contracture of left elbow 06/24/2016   Rheumatoid arthritis involving multiple sites with positive rheumatoid factor (HCC) 05/27/2016   ANA positive 05/27/2016   Lateral epicondylitis, left elbow 05/27/2016   Trochanteric bursitis of left hip 05/27/2016   High risk medication use 05/27/2016   DJD (degenerative joint disease), cervical 05/27/2016   Osteopenia of neck of left femur 05/27/2016   Primary osteoarthritis of both feet 05/27/2016   S/P TAH-BSO (5/30) 09/04/2012    Patient's Medications  New Prescriptions   No medications on file  Previous Medications   ACETAMINOPHEN (TYLENOL) 500 MG TABLET    Take 1,000 mg by mouth every 6 (six) hours as needed for pain.   CALCIUM CITRATE-VITAMIN D (CALCIUM + D PO)    Take 1 capsule by mouth daily.   CITALOPRAM (CELEXA) 20 MG TABLET    Take 1 tablet (20 mg total) by mouth daily.   CYANOCOBALAMIN (VITAMIN B12) 1000 MCG TABLET    Take 1,000 mcg by mouth daily.   FOLIC ACID (FOLVITE) 1 MG TABLET    Take 2 mg by mouth daily.   LEVOFLOXACIN (LEVAQUIN) 750 MG TABLET    Take 1 tablet (750 mg total) by mouth daily.   METHOCARBAMOL (ROBAXIN) 500 MG TABLET    Take 1 tablet (500 mg total) by mouth every 6 (six) hours as needed for muscle spasms.   METHOTREXATE (RHEUMATREX) 2.5 MG TABLET    TAKE 7 TABLETS(17.5 MG TOTAL) BY MOUTH ONCE A WEEK. PROTECT FROM LIGHT   MIRABEGRON ER (MYRBETRIQ) 50 MG TB24 TABLET    Take 50 mg by mouth daily.    OMEGA-3 FATTY ACIDS (FISH OIL) 1200 MG CAPS    Take 1,200 mg by mouth daily.   ONDANSETRON (ZOFRAN) 4 MG TABLET    Take 1 tablet (4 mg total) by mouth every 6 (six) hours as needed for nausea.   ORENCIA CLICKJECT 125 MG/ML SOAJ    INJECT ONE CLICKJECT PEN UNDER THE SKIN EVERY WEEK. REFRIGERATE. ALLOW TO WARM TO ROOM TEMPERATURE PRIOR TO ADMINISTRATION.   OXYCODONE (OXY IR/ROXICODONE) 5 MG IMMEDIATE RELEASE TABLET    Take 1-2 tablets (5-10 mg total) by mouth every 6 (six) hours as needed for severe pain.   VITAMIN D PO    Take 1 capsule by mouth daily.   VITAMIN E 450 MG (1000 UT) CAPS    Take 450 mg by mouth daily.  Modified Medications   No medications on file  Discontinued Medications   No medications on file    Subjective: 62 year old female with history of neuromuscular disorder, rheumatoid arthritis with methotrexate, complex history regarding right knee presents for hospital follow-up for right knee PJI status post I&D with polyethylene exchange on 7/27. Patient has undergone TKA revision 4 months prior to hospitalization course complicated by increased pain 2 weeks later.  Underwent polyethylene revision repair of patellar tendon rupture on 4/2/2024complicated by stitch abscess which was healing until 2 weeks ago.  Admitted for right knee I&D and poly exchange, OR cultures grew Pseudomonas aeruginosa and Morganella Morganii.  Patient had been on Keflex prior to hospitalization.  She was discharged on Levaquin 750 mg p.o. daily x 6 weeks EOT 12/08/2022 then plan to transition to 500 mg for chronic suppression.  Patient had been on RA on methotrexate or Orencia followed by Dr. Lendon Colonel.  I had contacted rheumatology with plans for holding methotrexate and Orencia.   Review of Systems: ROS  Past Medical History:  Diagnosis Date   Complication of anesthesia    "irregular heartbeat" during neck surgery   Depression    Fibroid    Neuromuscular disorder (HCC)    Pre-diabetes    Rheumatoid  arthritis (HCC)    Urinary incontinence     Social History   Tobacco Use   Smoking status: Never   Smokeless tobacco: Never  Vaping Use   Vaping status: Never Used  Substance Use Topics   Alcohol use: Yes    Comment: Rare wine   Drug use: No    Family History  Problem Relation Age of Onset   Ovarian cancer Mother 79   Lung cancer Father        asbestos   Cancer Brother 26       appendiceal   Seizures Brother    Heart Problems Brother    Colon cancer Maternal Aunt    Breast cancer Paternal Aunt 29   Colon cancer Maternal Grandmother 66   Stroke Paternal Grandmother    Heart disease Paternal Grandmother    Breast cancer Paternal Aunt 45    Allergies  Allergen Reactions   Sulfa Antibiotics Shortness Of Breath   Morphine Rash   Penicillins Rash    Tolerated Cephalosporin Date: 12/05/21. Tolerated Cephalosporin Date: 11/07/22.      Health Maintenance  Topic Date Due   Medicare Annual Wellness (AWV)  Never done   DTaP/Tdap/Td (1 - Tdap) Never done   Zoster Vaccines- Shingrix (1 of 2) Never done   PAP SMEAR-Modifier  Never done   MAMMOGRAM  06/16/2021   COVID-19 Vaccine (4 - 2023-24 season) 12/06/2021   INFLUENZA VACCINE  11/06/2022   Colonoscopy  09/13/2026   Hepatitis C Screening  Completed   HIV Screening  Completed   HPV VACCINES  Aged Out    Objective:  There were no vitals filed for this visit. There is no height or weight on file to calculate BMI.  Physical Exam  Lab Results Lab Results  Component Value Date   WBC 11.3 (H) 11/03/2022   HGB 9.9 (L) 11/03/2022   HCT 32.2 (L) 11/03/2022   MCV 82.6 11/03/2022   PLT 358 11/03/2022    Lab Results  Component Value Date   CREATININE 0.58 11/02/2022   BUN 10 11/02/2022   NA 135 11/02/2022   K 3.6 11/02/2022   CL 104 11/02/2022   CO2 24 11/02/2022    Lab Results  Component Value Date   ALT 11 10/30/2020   AST 15 10/30/2020   ALKPHOS 83 10/18/2016   BILITOT 0.5 10/30/2020    Lab Results   Component Value Date   CHOL 184 10/18/2016   HDL 68 10/18/2016   LDLCALC 103 (H) 10/18/2016   TRIG 66 10/18/2016   CHOLHDL 2.7 10/18/2016   No results found for: "LABRPR", "RPRTITER" No results found for: "HIV1RNAQUANT", "HIV1RNAVL", "CD4TABS"   Problem List Items Addressed This Visit   None  Assessment/Plan #Right knee PJI status post I&D andpolyethylene  exchange with wound VAC placement 7/27 - Patient had undergone TKA revision 4 months ago postop course was complicated by increased pain 2 weeks later.  She underwent polyethylene revision and repair of patellar  tendon rupture on 07/08/2022 complicated by stitch abscess which was healing until 2 weeks ago.  Admitted for right knee I&D and poly exchange, OR cultures grew Pseudomonas aeruginosa and Morganella Morganii.  Infectious disease engaged. -On review looks like Keflex was prescribed on 718 for a week  Recommendations:  -t levaquin 750mg  pO qf x 6 weeks EOT 12/12/2022 then transition to 500mg  for chronic suppression. - Labs today - EKG today - Follow-up in October to do labs on suppression antibiotics.   #RA on MTX and Orencia -Last dose of both meds 2 weeks ago -Followed by Dr. Hawks(Rheumatology) -Would recc holding both while on treatment for PJI, Of not pt will need chronic abx suppression as she prosthesis is retained.  -Would recc f/u with rheum following discharge(discussed with pt). I contacted Rheum in  regards to plan above   Danelle Earthly, MD [facility] 12/02/2022, 6:04 AM

## 2022-12-04 ENCOUNTER — Ambulatory Visit (HOSPITAL_BASED_OUTPATIENT_CLINIC_OR_DEPARTMENT_OTHER): Payer: No Typology Code available for payment source | Admitting: Internal Medicine

## 2022-12-29 NOTE — H&P (Signed)
TOTAL KNEE REVISION ADMISSION H&P  Patient is being admitted for revision right total knee arthroplasty.  Subjective:  Chief Complaint: Failed right total knee arthroplasty revision  HPI: [name] [name], 62 y.o. female has a history of right total knee arthroplasty revision, with pain and functional disability in the right knee due to recurrent dislocations. Patient has evidence of some subluxation at the tibial component by imaging studies. Her skin has healed well and there is no active infection.  Patient Active Problem List   Diagnosis Date Noted   History of total knee arthroplasty, right 11/01/2022   Painful total knee replacement, right (HCC) 07/08/2022   Failed total knee arthroplasty (HCC) 12/04/2021   Failed total left knee replacement (HCC) 12/04/2021   Status post total left knee replacement 02/28/2019   Chronic pain of left knee 02/28/2019   Status post total knee replacement, bilateral 12/01/2016   Status post bilateral foot surgery 12/01/2016   Contracture of right elbow 06/24/2016   Contracture of left elbow 06/24/2016   Rheumatoid arthritis involving multiple sites with positive rheumatoid factor (HCC) 05/27/2016   ANA positive 05/27/2016   Lateral epicondylitis, left elbow 05/27/2016   Trochanteric bursitis of left hip 05/27/2016   High risk medication use 05/27/2016   DJD (degenerative joint disease), cervical 05/27/2016   Osteopenia of neck of left femur 05/27/2016   Primary osteoarthritis of both feet 05/27/2016   S/P TAH-BSO (5/30) 09/04/2012    Past Medical History:  Diagnosis Date   Complication of anesthesia    "irregular heartbeat" during neck surgery   Depression    Fibroid    Neuromuscular disorder (HCC)    Pre-diabetes    Rheumatoid arthritis (HCC)    Urinary incontinence     Past Surgical History:  Procedure Laterality Date   ABDOMINAL HYSTERECTOMY N/A 09/03/2012   Procedure: HYSTERECTOMY ABDOMINAL;  Surgeon: Serita Kyle,  MD;  Location: [facility];  Service: Gynecology;  Laterality: N/A;   FOOT SURGERY Bilateral 2004   joint implants, each foot x 2. previous surgeries done on feet 1987   FOOT SURGERY Right 2008   IRRIGATION AND DEBRIDEMENT KNEE Right 11/01/2022   Procedure: Right knee irrigation and debridement; poly exchange; wound vac placement;  Surgeon: Ollen Gross, MD;  Location: [facility];  Service: Orthopedics;  Laterality: Right;   KNEE SURGERY Right 2006   fracture knee after replacement, repaired   POSTERIOR FUSION CERVICAL SPINE  2006   REPLACEMENT TOTAL KNEE Bilateral 1989   left 1989; right 1996   SALPINGOOPHORECTOMY Bilateral 09/03/2012   Procedure: SALPINGO OOPHORECTOMY;  Surgeon: Serita Kyle, MD;  Location: [facility];  Service: Gynecology;  Laterality: Bilateral;   TONSILLECTOMY  1978   TOTAL KNEE REVISION Left 12/04/2021   Procedure: TOTAL KNEE REVISION;  Surgeon: Ollen Gross, MD;  Location: [facility];  Service: Orthopedics;  Laterality: Left;   TOTAL KNEE REVISION Right 06/25/2022   Procedure: TOTAL KNEE REVISION;  Surgeon: Ollen Gross, MD;  Location: [facility];  Service: Orthopedics;  Laterality: Right;   TOTAL KNEE REVISION Right 07/09/2022   Procedure: RIGHT OPEN REDUCTION OF KNEE DISLOCATION, POSSIBLE POLY REVSION;  Surgeon: Ollen Gross, MD;  Location: [facility];  Service: Orthopedics;  Laterality: Right;    Prior to Admission medications   Medication Sig Start Date End Date Taking? Authorizing Provider  acetaminophen (TYLENOL) 500 MG tablet Take 1,000 mg by mouth every 6 (six) hours as needed for pain.    [provider]  Calcium Citrate-Vitamin D (CALCIUM +  D PO) Take 1 capsule by mouth daily.    [provider]  citalopram (CELEXA) 20 MG tablet Take 1 tablet (20 mg total) by mouth daily. 06/03/16   Pollyann Savoy, MD  cyanocobalamin (VITAMIN B12) 1000 MCG tablet Take 1,000 mcg by mouth daily.    [provider]  folic acid (FOLVITE) 1 MG tablet Take 2 mg  by mouth daily.    [provider]  methocarbamol (ROBAXIN) 500 MG tablet Take 1 tablet (500 mg total) by mouth every 6 (six) hours as needed for muscle spasms. 11/07/22   Edmisten, Kristie L, PA  methotrexate (RHEUMATREX) 2.5 MG tablet TAKE 7 TABLETS(17.5 MG TOTAL) BY MOUTH ONCE A WEEK. PROTECT FROM LIGHT 11/08/20   Gearldine Bienenstock, PA-C  mirabegron ER (MYRBETRIQ) 50 MG TB24 tablet Take 50 mg by mouth daily.    [provider]  Omega-3 Fatty Acids (FISH OIL) 1200 MG CAPS Take 1,200 mg by mouth daily.    [provider]  ondansetron (ZOFRAN) 4 MG tablet Take 1 tablet (4 mg total) by mouth every 6 (six) hours as needed for nausea. 11/07/22   Edmisten, Lyn Hollingshead, PA  ORENCIA CLICKJECT 125 MG/ML SOAJ INJECT ONE CLICKJECT PEN UNDER THE SKIN EVERY WEEK. REFRIGERATE. ALLOW TO WARM TO ROOM TEMPERATURE PRIOR TO ADMINISTRATION. 11/13/20   Gearldine Bienenstock, PA-C  oxyCODONE (OXY IR/ROXICODONE) 5 MG immediate release tablet Take 1-2 tablets (5-10 mg total) by mouth every 6 (six) hours as needed for severe pain. 11/07/22   Edmisten, Kristie L, PA  VITAMIN D PO Take 1 capsule by mouth daily.    [provider]  Vitamin E 450 MG (1000 UT) CAPS Take 450 mg by mouth daily.    [provider]    Allergies  Allergen Reactions   Sulfa Antibiotics Shortness Of Breath   Morphine Rash   Penicillins Rash    Tolerated Cephalosporin Date: 12/05/21. Tolerated Cephalosporin Date: 11/07/22.      Social History   Socioeconomic History   Marital status: Divorced    Spouse name: Not on file   Number of children: Not on file   Years of education: Not on file   Highest education level: Not on file  Occupational History   Not on file  Tobacco Use   Smoking status: Never   Smokeless tobacco: Never  Vaping Use   Vaping status: Never Used  Substance and Sexual Activity   Alcohol use: Yes    Comment: Rare wine   Drug use: No   Sexual activity: Yes    Birth control/protection:  Surgical  Other Topics Concern   Not on file  Social History Narrative   Not on file   Social Determinants of Health   Financial Resource Strain: Not on file  Food Insecurity: Food Insecurity Present (11/01/2022)   Hunger Vital Sign    Worried About Running Out of Food in the Last Year: Sometimes true    Ran Out of Food in the Last Year: Never true  Transportation Needs: No Transportation Needs (11/01/2022)   PRAPARE - Administrator, Civil Service (Medical): No    Lack of Transportation (Non-Medical): No  Physical Activity: Not on file  Stress: Not on file  Social Connections: Not on file  Intimate Partner Violence: Not At Risk (11/01/2022)   Humiliation, Afraid, Rape, and Kick questionnaire    Fear of Current or Ex-Partner: No    Emotionally Abused: No    Physically Abused: No  Sexually Abused: No    Tobacco Use: Low Risk  (11/01/2022)   Patient History    Smoking Tobacco Use: Never    Smokeless Tobacco Use: Never    Passive Exposure: Not on file   Social History   Substance and Sexual Activity  Alcohol Use Yes   Comment: Rare wine    Family History  Problem Relation Age of Onset   Ovarian cancer Mother 26   Lung cancer Father        asbestos   Cancer Brother 97       appendiceal   Seizures Brother    Heart Problems Brother    Colon cancer Maternal Aunt    Breast cancer Paternal Aunt 27   Colon cancer Maternal Grandmother 18   Stroke Paternal Grandmother    Heart disease Paternal Grandmother    Breast cancer Paternal Aunt 70    ROS  Objective:  Physical Exam: - Well-developed female, alert and oriented, in no apparent distress. - Right knee shows no warmth or effusion. - The skin is perfectly healed with no signs of infection. - Passively, the knee can achieve full extension and flexion up to approximately 100 degrees. - Some laxity is present in the knee.  Imaging Review Radiographs AP and lateral of the right knee taken 12/01/22  demonstrate that there appears to be some subluxation at the tibial component.   Assessment/Plan:  Failed right total knee arthroplasty revision    Carylon Forbis is status post right knee revision with persistent subluxation of the tibia on the femur. The patient's ligaments were significantly damaged from the loose implants she had prior to the revision, and despite the revision appearing stable intraoperatively, she has experienced two dislocations. The mechanical issues are due to the tibia subluxing. There are no signs of infection, and the skin has healed well. The plan is to convert the current implant to a hinged implant to maintain stability, which will involve revising the femur. The patient will be prescribed oxycodone for pain management.   The risks and benefits of total knee arthroplasty revision were presented and reviewed. The risks due to aseptic loosening, infection, stiffness, patella tracking problems, thromboembolic complications and other imponderables were discussed. The patient acknowledged the explanation, agreed to proceed with the plan and consent was signed. Patient is being admitted for inpatient treatment for surgery, pain control, PT, OT, prophylactic antibiotics, VTE prophylaxis, progressive ambulation and ADLs and discharge planning. The patient is planning to be discharged  home .  Anticipated LOS equal to or greater than 2 midnights due to - Age 5 and older with one or more of the following:  - Obesity  - Expected need for hospital services (PT, OT, Nursing) required for safe  discharge  - Anticipated need for postoperative skilled nursing care or inpatient rehab  - Active co-morbidities: None OR   - Unanticipated findings during/Post Surgery: Slow post-op progression: GI, pain control, mobility  - Patient is a high risk of re-admission due to: None  DVT Prophylaxis: Xarelto 10mg  QD  - Patient was instructed on what medications to stop prior to surgery. -  Follow-up visit in 2 weeks with Dr. Lequita Halt - Begin physical therapy following surgery - Pre-operative lab work as pre-surgical testing - Prescriptions will be provided in hospital at time of discharge  Weston Brass, PA-C Orthopedic Surgery [facility] [place]

## 2022-12-30 NOTE — Patient Instructions (Signed)
SURGICAL WAITING ROOM VISITATION  Patients having surgery or a procedure may have no more than 2 support people in the waiting area - these visitors may rotate.    Children under the age of 55 must have an adult with them who is not the patient.  Due to an increase in RSV and influenza rates and associated hospitalizations, children ages 37 and under may not visit patients in [facility] hospitals.  If the patient needs to stay at the hospital during part of their recovery, the visitor guidelines for inpatient rooms apply. Pre-op nurse will coordinate an appropriate time for 1 support person to accompany patient in pre-op.  This support person may not rotate.    Please refer to the [facility] website for the visitor guidelines for Inpatients (after your surgery is over and you are in a regular room).       Your procedure is scheduled on: 01/05/23   Report to [facility] Main Entrance    Report to admitting at 1:30 PM   Call this number if you have problems the morning of surgery [phone]   Do not eat food :After Midnight.   After Midnight you may have the following liquids until 12:55 PM DAY OF SURGERY  Water Non-Citrus Juices (without pulp, NO RED-Apple, White grape, White cranberry) Black Coffee (NO MILK/CREAM OR CREAMERS, sugar ok)  Clear Tea (NO MILK/CREAM OR CREAMERS, sugar ok) regular and decaf                             Plain Jell-O (NO RED)                                           Fruit ices (not with fruit pulp, NO RED)                                     Popsicles (NO RED)                                                               Sports drinks like Gatorade (NO RED)                The day of surgery:  Drink ONE (1) Pre-Surgery Clear Ensure at 12:55 PM the morning of surgery. Drink in one sitting. Do not sip.  This drink was given to you during your hospital  pre-op appointment visit. Nothing else to drink after completing the  Pre-Surgery Clear  Ensure   Oral Hygiene is also important to reduce your risk of infection.                                    Remember - BRUSH YOUR TEETH THE MORNING OF SURGERY WITH YOUR REGULAR TOOTHPASTE   Stop all vitamins and herbal supplements 7 days before surgery.   Take these medicines the morning of surgery with A SIP OF WATER:  You may not have any metal on your body including hair pins, jewelry, and body piercing             Do not wear make-up, lotions, powders, perfumes or deodorant  Do not wear nail polish including gel and S&S, artificial/acrylic nails, or any other type of covering on natural nails including finger and toenails. If you have artificial nails, gel coating, etc. that needs to be removed by a nail salon please have this removed prior to surgery or surgery may need to be canceled/ delayed if the surgeon/ anesthesia feels like they are unable to be safely monitored.   Do not shave  48 hours prior to surgery.    Do not bring valuables to the hospital. [facility] IS NOT             RESPONSIBLE   FOR VALUABLES.   Contacts, glasses, dentures or bridgework may not be worn into surgery.   Bring small overnight bag day of surgery.   DO NOT BRING YOUR HOME MEDICATIONS TO THE HOSPITAL. PHARMACY WILL DISPENSE MEDICATIONS LISTED ON YOUR MEDICATION LIST TO YOU DURING YOUR ADMISSION IN THE HOSPITAL!    Patients discharged on the day of surgery will not be allowed to drive home.  Someone NEEDS to stay with you for the first 24 hours after anesthesia.   Special Instructions: Bring a copy of your healthcare power of attorney and living will documents the day of surgery if you haven't scanned them before.              Please read over the following fact sheets you were given: IF YOU HAVE QUESTIONS ABOUT YOUR PRE-OP INSTRUCTIONS PLEASE CALL [phone] [facility]   If you received a COVID test during your pre-op visit  it is requested that you wear a mask when  out in public, stay away from anyone that may not be feeling well and notify your surgeon if you develop symptoms. If you test positive for Covid or have been in contact with anyone that has tested positive in the last 10 days please notify you surgeon.      Pre-operative 5 CHG Bath Instructions   You can play a key role in reducing the risk of infection after surgery. Your skin needs to be as free of germs as possible. You can reduce the number of germs on your skin by washing with CHG (chlorhexidine gluconate) soap before surgery. CHG is an antiseptic soap that kills germs and continues to kill germs even after washing.   DO NOT use if you have an allergy to chlorhexidine/CHG or antibacterial soaps. If your skin becomes reddened or irritated, stop using the CHG and notify one of our RNs at [phone].   Please shower with the CHG soap starting 4 days before surgery using the following schedule:     Please keep in mind the following:  DO NOT shave, including legs and underarms, starting the day of your first shower.   You may shave your face at any point before/day of surgery.  Place clean sheets on your bed the day you start using CHG soap. Use a clean washcloth (not used since being washed) for each shower. DO NOT sleep with pets once you start using the CHG.   CHG Shower Instructions:  If you choose to wash your hair and private area, wash first with your normal shampoo/soap.  After you use shampoo/soap, rinse your hair and body thoroughly to remove shampoo/soap residue.  Turn the  water OFF and apply about 3 tablespoons (45 ml) of CHG soap to a CLEAN washcloth.  Apply CHG soap ONLY FROM YOUR NECK DOWN TO YOUR TOES (washing for 3-5 minutes)  DO NOT use CHG soap on face, private areas, open wounds, or sores.  Pay special attention to the area where your surgery is being performed.  If you are having back surgery, having someone wash your back for you may be helpful. Wait 2 minutes  after CHG soap is applied, then you may rinse off the CHG soap.  Pat dry with a clean towel  Put on clean clothes/pajamas   If you choose to wear lotion, please use ONLY the CHG-compatible lotions on the back of this paper.     Additional instructions for the day of surgery: DO NOT APPLY any lotions, deodorants, cologne, or perfumes.   Put on clean/comfortable clothes.  Brush your teeth.  Ask your nurse before applying any prescription medications to the skin.      CHG Compatible Lotions   Aveeno Moisturizing lotion  Cetaphil Moisturizing Cream  Cetaphil Moisturizing Lotion  Clairol Herbal Essence Moisturizing Lotion, Dry Skin  Clairol Herbal Essence Moisturizing Lotion, Extra Dry Skin  Clairol Herbal Essence Moisturizing Lotion, Normal Skin  Curel Age Defying Therapeutic Moisturizing Lotion with Alpha Hydroxy  Curel Extreme Care Body Lotion  Curel Soothing Hands Moisturizing Hand Lotion  Curel Therapeutic Moisturizing Cream, Fragrance-Free  Curel Therapeutic Moisturizing Lotion, Fragrance-Free  Curel Therapeutic Moisturizing Lotion, Original Formula  Eucerin Daily Replenishing Lotion  Eucerin Dry Skin Therapy Plus Alpha Hydroxy Crme  Eucerin Dry Skin Therapy Plus Alpha Hydroxy Lotion  Eucerin Original Crme  Eucerin Original Lotion  Eucerin Plus Crme Eucerin Plus Lotion  Eucerin TriLipid Replenishing Lotion  Keri Anti-Bacterial Hand Lotion  Keri Deep Conditioning Original Lotion Dry Skin Formula Softly Scented  Keri Deep Conditioning Original Lotion, Fragrance Free Sensitive Skin Formula  Keri Lotion Fast Absorbing Fragrance Free Sensitive Skin Formula  Keri Lotion Fast Absorbing Softly Scented Dry Skin Formula  Keri Original Lotion  Keri Skin Renewal Lotion Keri Silky Smooth Lotion  Keri Silky Smooth Sensitive Skin Lotion  Nivea Body Creamy Conditioning Oil  Nivea Body Extra Enriched Lotion  Nivea Body Original Lotion  Nivea Body Sheer Moisturizing Lotion Nivea  Crme  Nivea Skin Firming Lotion  NutraDerm 30 Skin Lotion  NutraDerm Skin Lotion  NutraDerm Therapeutic Skin Cream  NutraDerm Therapeutic Skin Lotion  ProShield Protective Hand Cream   INCENTIVE SPIROMETER An incentive spirometer is a tool that can help keep your lungs clear and active. This tool measures how well you are filling your lungs with each breath. Taking long deep breaths may help reverse or decrease the chance of developing breathing (pulmonary) problems (especially infection) following: A long period of time when you are unable to move or be active. BEFORE THE PROCEDURE  If the spirometer includes an indicator to show your best effort, your nurse or respiratory therapist will set it to a desired goal. If possible, sit up straight or lean slightly forward. Try not to slouch. Hold the incentive spirometer in an upright position. INSTRUCTIONS FOR USE  Sit on the edge of your bed if possible, or sit up as far as you can in bed or on a chair. Hold the incentive spirometer in an upright position. Breathe out normally. Place the mouthpiece in your mouth and seal your lips tightly around it. Breathe in slowly and as deeply as possible, raising the piston or the ball toward the  top of the column. Hold your breath for 3-5 seconds or for as long as possible. Allow the piston or ball to fall to the bottom of the column. Remove the mouthpiece from your mouth and breathe out normally. Rest for a few seconds and repeat Steps 1 through 7 at least 10 times every 1-2 hours when you are awake. Take your time and take a few normal breaths between deep breaths. The spirometer may include an indicator to show your best effort. Use the indicator as a goal to work toward during each repetition. After each set of 10 deep breaths, practice coughing to be sure your lungs are clear. If you have an incision (the cut made at the time of surgery), support your incision when coughing by placing a pillow or  rolled up towels firmly against it. Once you are able to get out of bed, walk around indoors and cough well. You may stop using the incentive spirometer when instructed by your caregiver.  RISKS AND COMPLICATIONS Take your time so you do not get dizzy or light-headed. If you are in pain, you may need to take or ask for pain medication before doing incentive spirometry. It is harder to take a deep breath if you are having pain. AFTER USE Rest and breathe slowly and easily. It can be helpful to keep track of a log of your progress. Your caregiver can provide you with a simple table to help with this. If you are using the spirometer at home, follow these instructions: SEEK MEDICAL CARE IF:  You are having difficultly using the spirometer. You have trouble using the spirometer as often as instructed. Your pain medication is not giving enough relief while using the spirometer. You develop fever of 100.5 F (38.1 C) or higher. SEEK IMMEDIATE MEDICAL CARE IF:  You cough up bloody sputum that had not been present before. You develop fever of 102 F (38.9 C) or greater. You develop worsening pain at or near the incision site. MAKE SURE YOU:  Understand these instructions. Will watch your condition. Will get help right away if you are not doing well or get worse. Document Released: 08/04/2006 Document Revised: 06/16/2011 Document Reviewed: 10/05/2006 [facility] Patient Information 2014 [place], [place].

## 2022-12-30 NOTE — Progress Notes (Signed)
COVID Vaccine received:  []  No [x]  Yes Date of any COVID positive Test in last 90 days: No PCP - Ardean Larsen MD Cardiologist - no  Chest x-ray -  EKG -  11/06/22 Epic Stress Test - no ECHO - no Cardiac Cath - no  Bowel Prep - [x]  No  []   Yes ______  Pacemaker / ICD device [x]  No []  Yes   Spinal Cord Stimulator:[x]  No []  Yes       History of Sleep Apnea? [x]  No []  Yes   CPAP used?- [x]  No []  Yes    Does the patient monitor blood sugar?          [x]  No []  Yes  []  N/A  Patient has: []  NO Hx DM   [x]  Pre-DM                 []  DM1  []   DM2 Does patient have a [employer] or Dexacom? []  No []  Yes   Fasting Blood Sugar Ranges-  Checks Blood Sugar _____ times a day  GLP1 agonist / usual dose - no GLP1 instructions:  SGLT-2 inhibitors / usual dose - no SGLT-2 instructions:   Blood Thinner / Instructions:no Aspirin Instructions:no  Comments:   Activity level: Patient is unable to climb a flight of stairs without difficulty; [x]  No CP  [x]  No SOB, but would have __knee doesn't function_   Patient can /  perform ADLs without assistance.   Anesthesia review:   Patient denies shortness of breath, fever, cough and chest pain at PAT appointment.  Patient verbalized understanding and agreement to the Pre-Surgical Instructions that were given to them at this PAT appointment. Patient was also educated of the need to review these PAT instructions again prior to his/her surgery.I reviewed the appropriate phone numbers to call if they have any and questions or concerns.

## 2022-12-31 ENCOUNTER — Encounter (HOSPITAL_COMMUNITY)
Admission: RE | Admit: 2022-12-31 | Discharge: 2022-12-31 | Disposition: A | Discharge status: Home / Self Care | Payer: No Typology Code available for payment source | Source: Ambulatory Visit | Attending: Orthopedic Surgery

## 2022-12-31 ENCOUNTER — Encounter (HOSPITAL_COMMUNITY): Payer: Self-pay

## 2022-12-31 ENCOUNTER — Other Ambulatory Visit: Payer: Self-pay

## 2022-12-31 VITALS — BP 91/73 | HR 88 | Temp 98.0°F | Resp 16 | Ht 62.0 in | Wt 153.0 lb

## 2022-12-31 DIAGNOSIS — Z01812 Encounter for preprocedural laboratory examination: Secondary | ICD-10-CM | POA: Insufficient documentation

## 2022-12-31 DIAGNOSIS — Z01818 Encounter for other preprocedural examination: Secondary | ICD-10-CM

## 2022-12-31 LAB — TYPE AND SCREEN
ABO/RH(D): O POS
Antibody Screen: NEGATIVE

## 2022-12-31 LAB — BASIC METABOLIC PANEL
Anion gap: 10 (ref 5–15)
BUN: 11 mg/dL (ref 6–20)
CO2: 25 mmol/L (ref 22–32)
Calcium: 9.2 mg/dL (ref 8.9–10.3)
Chloride: 105 mmol/L (ref 98–111)
Creatinine, Ser: 0.63 mg/dL (ref 0.44–1.00)
GFR, Estimated: >60 mL/min (ref >60)
Glucose, Bld: 101 mg/dL — ABNORMAL HIGH (ref 70–99)
Potassium: 3.4 mmol/L — ABNORMAL LOW (ref 3.5–5.1)
Sodium: 140 mmol/L (ref 135–145)

## 2022-12-31 LAB — CBC
HCT: 38.6 % (ref 36.0–46.0)
Hemoglobin: 11.4 g/dL — ABNORMAL LOW (ref 12.0–15.0)
MCH: 24.1 pg — ABNORMAL LOW (ref 26.0–34.0)
MCHC: 29.5 g/dL — ABNORMAL LOW (ref 30.0–36.0)
MCV: 81.4 fL (ref 80.0–100.0)
Platelets: 276 10*3/uL (ref 150–400)
RBC: 4.74 MIL/uL (ref 3.87–5.11)
RDW: 16.5 % — ABNORMAL HIGH (ref 11.5–15.5)
WBC: 9.9 10*3/uL (ref 4.0–10.5)
nRBC: 0 % (ref 0.0–0.2)

## 2023-01-01 LAB — SURGICAL PCR SCREEN
MRSA, PCR: NEGATIVE
Staphylococcus aureus: NEGATIVE

## 2023-01-02 ENCOUNTER — Encounter (HOSPITAL_COMMUNITY): Payer: Self-pay | Admitting: Orthopedic Surgery

## 2023-01-05 ENCOUNTER — Inpatient Hospital Stay (HOSPITAL_COMMUNITY): Payer: Self-pay | Admitting: Anesthesiology

## 2023-01-05 ENCOUNTER — Inpatient Hospital Stay (HOSPITAL_COMMUNITY)
Admission: RE | Admit: 2023-01-05 | Discharge: 2023-01-07 | DRG: 468 | Disposition: A | Discharge status: Home Health | Payer: Medicare Other | Source: Ambulatory Visit | Attending: Orthopedic Surgery | Admitting: Orthopedic Surgery

## 2023-01-05 ENCOUNTER — Encounter (HOSPITAL_COMMUNITY): Payer: Self-pay | Admitting: Orthopedic Surgery

## 2023-01-05 ENCOUNTER — Other Ambulatory Visit: Payer: Self-pay

## 2023-01-05 ENCOUNTER — Encounter (HOSPITAL_COMMUNITY)
Admission: RE | Disposition: A | Discharge status: Home Health | Payer: Self-pay | Source: Ambulatory Visit | Attending: Orthopedic Surgery

## 2023-01-05 DIAGNOSIS — Z8041 Family history of malignant neoplasm of ovary: Secondary | ICD-10-CM | POA: Diagnosis not present

## 2023-01-05 DIAGNOSIS — Y831 Surgical operation with implant of artificial internal device as the cause of abnormal reaction of the patient, or of later complication, without mention of misadventure at the time of the procedure: Secondary | ICD-10-CM | POA: Diagnosis present

## 2023-01-05 DIAGNOSIS — Z801 Family history of malignant neoplasm of trachea, bronchus and lung: Secondary | ICD-10-CM

## 2023-01-05 DIAGNOSIS — T84093A Other mechanical complication of internal left knee prosthesis, initial encounter: Principal | ICD-10-CM

## 2023-01-05 DIAGNOSIS — Z8249 Family history of ischemic heart disease and other diseases of the circulatory system: Secondary | ICD-10-CM | POA: Diagnosis not present

## 2023-01-05 DIAGNOSIS — Z5941 Food insecurity: Secondary | ICD-10-CM | POA: Diagnosis not present

## 2023-01-05 DIAGNOSIS — M0579 Rheumatoid arthritis with rheumatoid factor of multiple sites without organ or systems involvement: Secondary | ICD-10-CM | POA: Diagnosis present

## 2023-01-05 DIAGNOSIS — Z79899 Other long term (current) drug therapy: Secondary | ICD-10-CM

## 2023-01-05 DIAGNOSIS — Z880 Allergy status to penicillin: Secondary | ICD-10-CM

## 2023-01-05 DIAGNOSIS — Z823 Family history of stroke: Secondary | ICD-10-CM

## 2023-01-05 DIAGNOSIS — Z803 Family history of malignant neoplasm of breast: Secondary | ICD-10-CM

## 2023-01-05 DIAGNOSIS — Z885 Allergy status to narcotic agent status: Secondary | ICD-10-CM | POA: Diagnosis not present

## 2023-01-05 DIAGNOSIS — Z800 Family history of malignant neoplasm of digestive organs: Secondary | ICD-10-CM | POA: Diagnosis not present

## 2023-01-05 DIAGNOSIS — T84018A Broken internal joint prosthesis, other site, initial encounter: Secondary | ICD-10-CM | POA: Diagnosis present

## 2023-01-05 DIAGNOSIS — T84022A Instability of internal right knee prosthesis, initial encounter: Secondary | ICD-10-CM | POA: Diagnosis present

## 2023-01-05 DIAGNOSIS — Z820 Family history of epilepsy and other diseases of the nervous system: Secondary | ICD-10-CM

## 2023-01-05 DIAGNOSIS — Z96651 Presence of right artificial knee joint: Secondary | ICD-10-CM

## 2023-01-05 DIAGNOSIS — Z96659 Presence of unspecified artificial knee joint: Secondary | ICD-10-CM

## 2023-01-05 DIAGNOSIS — Z882 Allergy status to sulfonamides status: Secondary | ICD-10-CM

## 2023-01-05 HISTORY — PX: FEMORAL REVISION: SHX5830

## 2023-01-05 SURGERY — FEMORAL REVISION
Anesthesia: Regional | Laterality: Right

## 2023-01-05 MED ORDER — ORAL CARE MOUTH RINSE: 15.0000 mL | Freq: Once | OROMUCOSAL | Status: AC

## 2023-01-05 MED ORDER — SODIUM CHLORIDE (PF) 0.9 % IJ SOLN
INTRAMUSCULAR | Status: AC
  Filled 2023-01-05: qty 50

## 2023-01-05 MED ORDER — ONDANSETRON HCL 4 MG/2ML IJ SOLN
INTRAMUSCULAR | Status: DC | PRN
  Administered 2023-01-05: 4 mg via INTRAVENOUS

## 2023-01-05 MED ORDER — PROPOFOL 10 MG/ML IV BOLUS
INTRAVENOUS | Status: DC | PRN
  Administered 2023-01-05 (×2): 50 mg via INTRAVENOUS

## 2023-01-05 MED ORDER — CEFAZOLIN SODIUM-DEXTROSE 2-4 GM/100ML-% IV SOLN
2.0000 g | INTRAVENOUS | Status: AC
  Administered 2023-01-05: 2 g via INTRAVENOUS
  Filled 2023-01-05: qty 100

## 2023-01-05 MED ORDER — LIDOCAINE HCL (CARDIAC) PF 100 MG/5ML IV SOSY
PREFILLED_SYRINGE | INTRAVENOUS | Status: DC | PRN
  Administered 2023-01-05: 25 mg via INTRAVENOUS
  Administered 2023-01-05: 50 mg via INTRAVENOUS

## 2023-01-05 MED ORDER — LIDOCAINE HCL (PF) 2 % IJ SOLN
INTRAMUSCULAR | Status: AC
  Filled 2023-01-05: qty 5

## 2023-01-05 MED ORDER — SODIUM CHLORIDE 0.9 % IV SOLN: INTRAVENOUS | Status: DC

## 2023-01-05 MED ORDER — PHENYLEPHRINE HCL (PRESSORS) 10 MG/ML IV SOLN
INTRAVENOUS | Status: DC | PRN
Start: 2023-01-05 — End: 2023-01-05

## 2023-01-05 MED ORDER — ACETAMINOPHEN 325 MG PO TABS
325.0000 mg | ORAL_TABLET | Freq: Four times a day (QID) | ORAL | Status: DC | PRN
  Administered 2023-01-07: 650 mg via ORAL
  Filled 2023-01-05 (×2): qty 2

## 2023-01-05 MED ORDER — PHENYLEPHRINE HCL-NACL 20-0.9 MG/250ML-% IV SOLN
INTRAVENOUS | Status: DC | PRN
Start: 2023-01-05 — End: 2023-01-05
  Administered 2023-01-05: 50 ug/min via INTRAVENOUS

## 2023-01-05 MED ORDER — LEVOFLOXACIN 500 MG PO TABS
750.0000 mg | ORAL_TABLET | Freq: Every day | ORAL | Status: DC
  Administered 2023-01-06 – 2023-01-07 (×2): 750 mg via ORAL
  Filled 2023-01-05 (×2): qty 2

## 2023-01-05 MED ORDER — FENTANYL CITRATE PF 50 MCG/ML IJ SOSY: 25.0000 ug | PREFILLED_SYRINGE | INTRAMUSCULAR | Status: DC | PRN

## 2023-01-05 MED ORDER — FLEET ENEMA RE ENEM: 1.0000 | ENEMA | Freq: Once | RECTAL | Status: DC | PRN

## 2023-01-05 MED ORDER — POLYETHYLENE GLYCOL 3350 17 G PO PACK: 17.0000 g | PACK | Freq: Every day | ORAL | Status: DC | PRN

## 2023-01-05 MED ORDER — MIDAZOLAM HCL 2 MG/2ML IJ SOLN
2.0000 mg | INTRAMUSCULAR | Status: DC
  Administered 2023-01-05: 2 mg via INTRAVENOUS
  Filled 2023-01-05: qty 2

## 2023-01-05 MED ORDER — DEXAMETHASONE SODIUM PHOSPHATE 10 MG/ML IJ SOLN
INTRAMUSCULAR | Status: DC | PRN
Start: 2023-01-05 — End: 2023-01-05
  Administered 2023-01-05: 10 mg

## 2023-01-05 MED ORDER — ONDANSETRON HCL 4 MG/2ML IJ SOLN
INTRAMUSCULAR | Status: AC
  Filled 2023-01-05: qty 2

## 2023-01-05 MED ORDER — CHLORHEXIDINE GLUCONATE 0.12 % MT SOLN
15.0000 mL | Freq: Once | OROMUCOSAL | Status: AC
  Administered 2023-01-05: 15 mL via OROMUCOSAL

## 2023-01-05 MED ORDER — BUPIVACAINE LIPOSOME 1.3 % IJ SUSP
INTRAMUSCULAR | Status: AC
  Filled 2023-01-05: qty 20

## 2023-01-05 MED ORDER — PHENOL 1.4 % MT LIQD
1.0000 | OROMUCOSAL | Status: DC | PRN
  Administered 2023-01-06: 1 via OROMUCOSAL
  Filled 2023-01-05: qty 177

## 2023-01-05 MED ORDER — ONDANSETRON HCL 4 MG PO TABS: 4.0000 mg | ORAL_TABLET | Freq: Four times a day (QID) | ORAL | Status: DC | PRN

## 2023-01-05 MED ORDER — CITALOPRAM HYDROBROMIDE 20 MG PO TABS: 20.0000 mg | ORAL_TABLET | Freq: Every day | ORAL | Status: DC

## 2023-01-05 MED ORDER — PREDNISONE 5 MG PO TABS
5.0000 mg | ORAL_TABLET | Freq: Every day | ORAL | Status: DC
  Administered 2023-01-06 – 2023-01-07 (×2): 5 mg via ORAL
  Filled 2023-01-05 (×2): qty 1

## 2023-01-05 MED ORDER — METOCLOPRAMIDE HCL 5 MG PO TABS: 5.0000 mg | ORAL_TABLET | Freq: Three times a day (TID) | ORAL | Status: DC | PRN

## 2023-01-05 MED ORDER — METHOCARBAMOL 500 MG PO TABS
500.0000 mg | ORAL_TABLET | Freq: Four times a day (QID) | ORAL | Status: DC | PRN
  Administered 2023-01-06: 500 mg via ORAL
  Filled 2023-01-05 (×2): qty 1

## 2023-01-05 MED ORDER — SODIUM CHLORIDE 0.9 % IV SOLN
INTRAVENOUS | Status: DC | PRN
  Administered 2023-01-05: 70 mL

## 2023-01-05 MED ORDER — BUPIVACAINE LIPOSOME 1.3 % IJ SUSP: 20.0000 mL | Freq: Once | INTRAMUSCULAR | Status: DC

## 2023-01-05 MED ORDER — DOCUSATE SODIUM 100 MG PO CAPS
100.0000 mg | ORAL_CAPSULE | Freq: Two times a day (BID) | ORAL | Status: DC
  Administered 2023-01-05 – 2023-01-07 (×4): 100 mg via ORAL
  Filled 2023-01-05 (×4): qty 1

## 2023-01-05 MED ORDER — POVIDONE-IODINE 10 % EX SWAB: 2.0000 | Freq: Once | CUTANEOUS | Status: DC

## 2023-01-05 MED ORDER — FENTANYL CITRATE PF 50 MCG/ML IJ SOSY
PREFILLED_SYRINGE | INTRAMUSCULAR | Status: AC
  Filled 2023-01-05: qty 2

## 2023-01-05 MED ORDER — LACTATED RINGERS IV SOLN: INTRAVENOUS | Status: DC

## 2023-01-05 MED ORDER — PHENYLEPHRINE HCL (PRESSORS) 10 MG/ML IV SOLN
INTRAVENOUS | Status: AC
  Filled 2023-01-05: qty 1

## 2023-01-05 MED ORDER — SODIUM CHLORIDE 0.9 % IR SOLN
Status: DC | PRN
  Administered 2023-01-05: 1000 mL

## 2023-01-05 MED ORDER — ACETAMINOPHEN 500 MG PO TABS
1000.0000 mg | ORAL_TABLET | Freq: Four times a day (QID) | ORAL | Status: AC
  Administered 2023-01-05 – 2023-01-06 (×4): 1000 mg via ORAL
  Filled 2023-01-05 (×4): qty 2

## 2023-01-05 MED ORDER — BISACODYL 10 MG RE SUPP: 10.0000 mg | Freq: Every day | RECTAL | Status: DC | PRN

## 2023-01-05 MED ORDER — 0.9 % SODIUM CHLORIDE (POUR BTL) OPTIME
TOPICAL | Status: DC | PRN
Start: 2023-01-05 — End: 2023-01-05
  Administered 2023-01-05: 1000 mL

## 2023-01-05 MED ORDER — TRANEXAMIC ACID-NACL 1000-0.7 MG/100ML-% IV SOLN
1000.0000 mg | INTRAVENOUS | Status: AC
  Administered 2023-01-05: 1000 mg via INTRAVENOUS
  Filled 2023-01-05: qty 100

## 2023-01-05 MED ORDER — ROPIVACAINE HCL 5 MG/ML IJ SOLN
INTRAMUSCULAR | Status: DC | PRN
Start: 2023-01-05 — End: 2023-01-05
  Administered 2023-01-05: 20 mL via PERINEURAL

## 2023-01-05 MED ORDER — OXYCODONE HCL 5 MG PO TABS
5.0000 mg | ORAL_TABLET | ORAL | Status: DC | PRN
  Administered 2023-01-05 – 2023-01-07 (×3): 5 mg via ORAL
  Filled 2023-01-05 (×4): qty 1

## 2023-01-05 MED ORDER — ONDANSETRON HCL 4 MG/2ML IJ SOLN: 4.0000 mg | Freq: Four times a day (QID) | INTRAMUSCULAR | Status: DC | PRN

## 2023-01-05 MED ORDER — RIVAROXABAN 10 MG PO TABS
10.0000 mg | ORAL_TABLET | Freq: Every day | ORAL | Status: DC
  Administered 2023-01-06 – 2023-01-07 (×2): 10 mg via ORAL
  Filled 2023-01-05 (×2): qty 1

## 2023-01-05 MED ORDER — MENTHOL 3 MG MT LOZG
1.0000 | LOZENGE | OROMUCOSAL | Status: DC | PRN
  Administered 2023-01-06: 3 mg via ORAL
  Filled 2023-01-05: qty 9

## 2023-01-05 MED ORDER — DIPHENHYDRAMINE HCL 12.5 MG/5ML PO ELIX: 12.5000 mg | ORAL_SOLUTION | ORAL | Status: DC | PRN

## 2023-01-05 MED ORDER — GABAPENTIN 300 MG PO CAPS
300.0000 mg | ORAL_CAPSULE | Freq: Three times a day (TID) | ORAL | Status: DC
  Administered 2023-01-05 – 2023-01-07 (×5): 300 mg via ORAL
  Filled 2023-01-05 (×5): qty 1

## 2023-01-05 MED ORDER — METOCLOPRAMIDE HCL 5 MG/ML IJ SOLN: 5.0000 mg | Freq: Three times a day (TID) | INTRAMUSCULAR | Status: DC | PRN

## 2023-01-05 MED ORDER — METHOCARBAMOL 1000 MG/10ML IJ SOLN: 500.0000 mg | Freq: Four times a day (QID) | INTRAVENOUS | Status: DC | PRN

## 2023-01-05 MED ORDER — FENTANYL CITRATE PF 50 MCG/ML IJ SOSY
100.0000 ug | PREFILLED_SYRINGE | INTRAMUSCULAR | Status: DC
  Administered 2023-01-05: 50 ug via INTRAVENOUS

## 2023-01-05 MED ORDER — HYDROMORPHONE HCL 1 MG/ML IJ SOLN: 0.5000 mg | INTRAMUSCULAR | Status: DC | PRN

## 2023-01-05 MED ORDER — DEXAMETHASONE SODIUM PHOSPHATE 10 MG/ML IJ SOLN
8.0000 mg | Freq: Once | INTRAMUSCULAR | Status: AC
  Administered 2023-01-05: 10 mg via INTRAVENOUS

## 2023-01-05 MED ORDER — STERILE WATER FOR IRRIGATION IR SOLN
Status: DC | PRN
Start: 2023-01-05 — End: 2023-01-05
  Administered 2023-01-05: 2000 mL

## 2023-01-05 MED ORDER — ACETAMINOPHEN 10 MG/ML IV SOLN
1000.0000 mg | Freq: Four times a day (QID) | INTRAVENOUS | Status: DC
  Administered 2023-01-05: 1000 mg via INTRAVENOUS
  Filled 2023-01-05: qty 100

## 2023-01-05 MED ORDER — PHENYLEPHRINE 80 MCG/ML (10ML) SYRINGE FOR IV PUSH (FOR BLOOD PRESSURE SUPPORT)
PREFILLED_SYRINGE | INTRAVENOUS | Status: DC | PRN
Start: 2023-01-05 — End: 2023-01-05
  Administered 2023-01-05 (×3): 160 ug via INTRAVENOUS
  Administered 2023-01-05: 80 ug via INTRAVENOUS

## 2023-01-05 MED ORDER — DEXAMETHASONE SODIUM PHOSPHATE 10 MG/ML IJ SOLN
10.0000 mg | Freq: Once | INTRAMUSCULAR | Status: AC
  Administered 2023-01-06: 10 mg via INTRAVENOUS
  Filled 2023-01-05: qty 1

## 2023-01-05 MED ORDER — DEXAMETHASONE SODIUM PHOSPHATE 10 MG/ML IJ SOLN
INTRAMUSCULAR | Status: AC
  Filled 2023-01-05: qty 1

## 2023-01-05 MED ORDER — BUPIVACAINE IN DEXTROSE 0.75-8.25 % IT SOLN
INTRATHECAL | Status: DC | PRN
  Administered 2023-01-05: 1.8 mL via INTRATHECAL

## 2023-01-05 MED ORDER — PHENYLEPHRINE 80 MCG/ML (10ML) SYRINGE FOR IV PUSH (FOR BLOOD PRESSURE SUPPORT)
PREFILLED_SYRINGE | INTRAVENOUS | Status: AC
  Filled 2023-01-05: qty 10

## 2023-01-05 MED ORDER — PROPOFOL 500 MG/50ML IV EMUL
INTRAVENOUS | Status: DC | PRN
  Administered 2023-01-05: 100 ug/kg/min via INTRAVENOUS

## 2023-01-05 MED ORDER — CEFAZOLIN SODIUM-DEXTROSE 2-4 GM/100ML-% IV SOLN
2.0000 g | Freq: Four times a day (QID) | INTRAVENOUS | Status: AC
  Administered 2023-01-05 – 2023-01-06 (×2): 2 g via INTRAVENOUS
  Filled 2023-01-05 (×2): qty 100

## 2023-01-05 MED ORDER — MIRABEGRON ER 25 MG PO TB24
50.0000 mg | ORAL_TABLET | Freq: Every day | ORAL | Status: DC
  Administered 2023-01-06 – 2023-01-07 (×2): 50 mg via ORAL
  Filled 2023-01-05 (×2): qty 2

## 2023-01-05 MED ORDER — PROPOFOL 1000 MG/100ML IV EMUL
INTRAVENOUS | Status: AC
  Filled 2023-01-05: qty 100

## 2023-01-05 MED ORDER — SODIUM CHLORIDE (PF) 0.9 % IJ SOLN
INTRAMUSCULAR | Status: AC
  Filled 2023-01-05: qty 10

## 2023-01-05 SURGICAL SUPPLY — 63 items
BAG COUNTER SPONGE SURGICOUNT (BAG) IMPLANT
BAG SPEC THK2 15X12 ZIP CLS (MISCELLANEOUS) ×1
BAG SPNG CNTER NS LX DISP (BAG)
BAG ZIPLOCK 12X15 (MISCELLANEOUS) ×2 IMPLANT
BLADE SAG 18X100X1.27 (BLADE) ×2 IMPLANT
BLADE SAW SGTL 11.0X1.19X90.0M (BLADE) ×2 IMPLANT
BNDG CMPR 6 X 5 YARDS HK CLSR (GAUZE/BANDAGES/DRESSINGS) ×1
BNDG ELASTIC 6INX 5YD STR LF (GAUZE/BANDAGES/DRESSINGS) ×2 IMPLANT
BONE CEMENT GENTAMICIN (Cement) ×2 IMPLANT
CEMENT BONE GENTAMICIN 40 (Cement) ×6 IMPLANT
COVER SURGICAL LIGHT HANDLE (MISCELLANEOUS) ×2 IMPLANT
CUFF TOURN SGL QUICK 34 (TOURNIQUET CUFF) ×1
CUFF TRNQT CYL 34X4.125X (TOURNIQUET CUFF) ×2 IMPLANT
DRAPE POUCH INSTRU U-SHP 10X18 (DRAPES) ×2 IMPLANT
DRAPE U-SHAPE 47X51 STRL (DRAPES) ×2 IMPLANT
DRESSING MEPILEX FLEX 4X4 (GAUZE/BANDAGES/DRESSINGS) IMPLANT
DRSG ADAPTIC 3X8 NADH LF (GAUZE/BANDAGES/DRESSINGS) ×2 IMPLANT
DRSG AQUACEL AG ADV 3.5X10 (GAUZE/BANDAGES/DRESSINGS) IMPLANT
DRSG AQUACEL AG ADV 3.5X14 (GAUZE/BANDAGES/DRESSINGS) IMPLANT
DRSG MEPILEX FLEX 4X4 (GAUZE/BANDAGES/DRESSINGS) ×1
DURAPREP 26ML APPLICATOR (WOUND CARE) ×2 IMPLANT
ELECT REM PT RETURN 15FT ADLT (MISCELLANEOUS) ×2 IMPLANT
EVACUATOR 1/8 PVC DRAIN (DRAIN) ×2 IMPLANT
FEMUR HINGE RT X SMALL (Orthopedic Implant) ×1 IMPLANT
FEMUR HINGED RT X SMALL (Orthopedic Implant) IMPLANT
GAUZE PAD ABD 8X10 STRL (GAUZE/BANDAGES/DRESSINGS) ×2 IMPLANT
GAUZE SPONGE 4X4 12PLY STRL (GAUZE/BANDAGES/DRESSINGS) ×2 IMPLANT
GLOVE BIO SURGEON STRL SZ 6.5 (GLOVE) ×4 IMPLANT
GLOVE BIO SURGEON STRL SZ7.5 (GLOVE) ×2 IMPLANT
GLOVE BIO SURGEON STRL SZ8 (GLOVE) ×4 IMPLANT
GLOVE BIOGEL PI IND STRL 7.0 (GLOVE) ×4 IMPLANT
GLOVE BIOGEL PI IND STRL 8 (GLOVE) ×6 IMPLANT
GLOVE SURG SS PI 7.0 STRL IVOR (GLOVE) ×2 IMPLANT
GOWN STRL REUS W/ TWL LRG LVL3 (GOWN DISPOSABLE) ×6 IMPLANT
GOWN STRL REUS W/TWL LRG LVL3 (GOWN DISPOSABLE) ×3
HANDPIECE INTERPULSE COAX TIP (DISPOSABLE) ×1
HIP STEM DIST XS S MED (Knees) ×2 IMPLANT
IMMOBILIZER KNEE 20 (SOFTGOODS) ×1
IMMOBILIZER KNEE 20 THIGH 36 (SOFTGOODS) ×2 IMPLANT
INSERT REV ATTUNE KNEE XS 26 (Insert) IMPLANT
KIT BASIN OR (CUSTOM PROCEDURE TRAY) ×2 IMPLANT
KIT TURNOVER KIT A (KITS) IMPLANT
MANIFOLD NEPTUNE II (INSTRUMENTS) ×2 IMPLANT
NS IRRIG 1000ML POUR BTL (IV SOLUTION) ×2 IMPLANT
PACK TOTAL JOINT (CUSTOM PROCEDURE TRAY) ×2 IMPLANT
PADDING CAST COTTON 6X4 STRL (CAST SUPPLIES) ×4 IMPLANT
PIN STEINMAN FIXATION KNEE (PIN) IMPLANT
PROTECTOR NERVE ULNAR (MISCELLANEOUS) ×2 IMPLANT
SET HNDPC FAN SPRY TIP SCT (DISPOSABLE) ×2 IMPLANT
SLEEVE UNIV FRM 40MM (Knees) IMPLANT
STAPLER VISISTAT 35W (STAPLE) ×2 IMPLANT
STEM HIP DIST XS S MED (Knees) IMPLANT
STEM UNIVERSAL REVISION 75X12 (Stem) IMPLANT
SUT VIC AB 1 CT1 27 (SUTURE) ×1
SUT VIC AB 1 CT1 27XBRD ANTBC (SUTURE) ×2 IMPLANT
SUT VIC AB 2-0 CT1 27 (SUTURE) ×3
SUT VIC AB 2-0 CT1 TAPERPNT 27 (SUTURE) ×6 IMPLANT
SWAB COLLECTION DEVICE MRSA (MISCELLANEOUS) ×2 IMPLANT
SWAB CULTURE ESWAB REG 1ML (MISCELLANEOUS) ×2 IMPLANT
TOWER CARTRIDGE SMART MIX (DISPOSABLE) ×2 IMPLANT
TRAY FOLEY MTR SLVR 16FR STAT (SET/KITS/TRAYS/PACK) ×2 IMPLANT
WATER STERILE IRR 1000ML POUR (IV SOLUTION) ×2 IMPLANT
WRAP KNEE MAXI GEL POST OP (GAUZE/BANDAGES/DRESSINGS) ×4 IMPLANT

## 2023-01-05 NOTE — Progress Notes (Signed)
Orthopedic Tech Progress Note Patient Details:  [name] [name] 1961-03-12 [id number]  CPM Right Knee CPM Right Knee: On Right Knee Flexion (Degrees): 40 Right Knee Extension (Degrees): 10  Post Interventions Patient Tolerated: Well  Darleen Crocker 01/05/2023, 6:03 PM

## 2023-01-05 NOTE — Op Note (Unsigned)
NAME: [name] [name], [name] B. MEDICAL RECORD NO: [id number] ACCOUNT NO: [id number] DATE OF BIRTH: [birth year] FACILITY: [facility] LOCATION: [id number] PHYSICIAN: Gus Rankin. Ailsa Mireles, MD  Operative Report   DATE OF PROCEDURE: 01/05/2023  PREOPERATIVE DIAGNOSIS:  Unstable right total knee arthroplasty.  POSTOPERATIVE DIAGNOSIS:  Unstable right total knee arthroplasty.  PROCEDURE:  Right femoral revision.  SURGEON:  Gus Rankin. Fryda Molenda, MD.  ASSISTANTArcola Jansky, PA-C.  ANESTHESIA:  Spinal and adductor canal block.  ESTIMATED BLOOD LOSS:  75 mL.  DRAINS:  None.  TOURNIQUET TIME:  58 minutes at 300 mmHg.  COMPLICATIONS:  None.  CONDITION:  Stable to recovery.  BRIEF CLINICAL NOTE:  The patient is a 62 year old female, very complex history in regards to her right knee.  She had a revision and then has had instability episodes.  The knee has subluxed.  She presents now for revision to a hinged component.  This  will require a femoral revision.  She presents for that today.  PROCEDURE IN DETAIL:  After successful administration of adductor canal block and spinal anesthetic, a tourniquet was placed on her right thigh and her right lower extremity prepped and draped in the usual sterile fashion.  Extremity was wrapped in  Esmarch, tourniquet inflated to 300 mmHg.  Midline incision was made with a 10 blade through subcutaneous tissue to the extensor mechanism.  Fresh blade was used to make a medial parapatellar arthrotomy.  There was minimal fluid in the joint, which looks  like old hematoma.  This was sent for Gram stain, culture and sensitivity.  Soft tissue on the proximal medial tibia subperiosteally elevated the joint line with a knife and into the semimembranosus bursa with a Cobb elevator.  Soft tissue laterally was  elevated with attention being paid to avoid the patellar tendon on tibial tubercle.  I excised the scar tissue from underneath the extensor mechanism.  We then removed the tibial  polyethylene from the tibial tray.  Tibial component was very stable.  On  the femoral side, I needed to remove the femoral component in order to placed a hinged component.  Osteotome was used to disrupt the interface between the femoral component and bone.  I was easily able to remove the femoral component by disrupting at interface.  We then removed any abnormal soft tissue from the distal femur.  I started reaming and  reamed the canal up to 12 mm for a 12 x 110 stem.  This had a great press fit.  We then placed the distal femoral cutting guide off of the reamer for alignment and made a refresh cut of about 2 mm.  Extra small was the most appropriate size for the  hinged femur.  We placed the extra small cutting block and did the posterior chamfer cut.  All other cuts did not need to be redone.  We then reamed with the starter reamer and then broached for the sleeve up to a 40 for a 40 sleeve, which had excellent  purchase.  I was going to place 10 mm augments, but we did not get good enough purchase with the sleeve, so I went without the augments.  The trial femur, which was an extra small with a 40 sleeve and a 12 x 110 stem was placed.  Had a great purchase.   With a 24 insert, full extension was achieved with excellent balance throughout full range of motion.  The trials were removed.  The component was assembled on the back table.  Two  batches of antibiotic cement were then mixed and once ready for  implantation, we cemented distally, had the porous coated sleeve and a press-fit stem.  This was impacted.  A 24 insert was placed.  Knee held in full extension.  All extruded cement removed.  When the cement was fully hardened, then a permanent 24 mm  rotating platform hinged insert was placed into the tibial tray.  It was reduced into the femur and then the locking pin was placed from medial to lateral and a locking pin locked in place.  This was a very stable construct.  The patella tracks normally.    Wound was copiously irrigated with saline solution with 3 liters.  We did another 3 liters prior to placing the implant. Exparel was then injected into the extensor mechanism, subcutaneous tissues and periosteum of the femur.  This was 20 mL of Exparel  mixed with 60 mL of saline.  We irrigated again and then closed the arthrotomy with a running 0 Stratafix suture.  Flexion against gravity was 110 degrees.  Patella tracks normally.  Tourniquet was then released, total time of 58 minutes.  Subcutaneous  was closed with interrupted 2-0 Vicryl and skin with staples.  The incisions cleaned and dried and a sterile dressing applied.  She was then placed into a knee immobilizer, awakened, and transported to recovery in stable condition.  Note that a surgical assistant was of medical necessity for this procedure to do it in a safe and expeditious manner.  Surgical assistant was necessary for retraction of vital ligaments and neurovascular structures and for proper positioning of the limb,  for removal of the old implant and safe and accurate placement of the new implant.   NIK D: 01/05/2023 5:39:12 pm T: 01/05/2023 9:54:00 pm  JOB: [id number]/ [id number]

## 2023-01-05 NOTE — Anesthesia Preprocedure Evaluation (Addendum)
Anesthesia Evaluation  Patient identified by MRN, date of birth, ID band Patient awake    Reviewed: Allergy & Precautions, NPO status , Patient's Chart, lab work & pertinent test results  History of Anesthesia Complications (+) DIFFICULT AIRWAY and history of anesthetic complications (Grade 1 view with glidescope)  Airway Mallampati: II  TM Distance: >3 FB Neck ROM: Full    Dental no notable dental hx. (+) Teeth Intact, Dental Advisory Given   Pulmonary neg pulmonary ROS   Pulmonary exam normal breath sounds clear to auscultation       Cardiovascular negative cardio ROS Normal cardiovascular exam Rhythm:Regular Rate:Normal     Neuro/Psych  PSYCHIATRIC DISORDERS  Depression    negative neurological ROS     GI/Hepatic negative GI ROS, Neg liver ROS,,,  Endo/Other  negative endocrine ROS    Renal/GU negative Renal ROS  negative genitourinary   Musculoskeletal  (+) Arthritis , Rheumatoid disorders,    Abdominal   Peds  Hematology negative hematology ROS (+)   Anesthesia Other Findings   Reproductive/Obstetrics                             Anesthesia Physical Anesthesia Plan  ASA: 2  Anesthesia Plan: Spinal and Regional   Post-op Pain Management: Regional block* and Ofirmev IV (intra-op)*   Induction:   PONV Risk Score and Plan: 2 and Treatment may vary due to age or medical condition, Midazolam, Propofol infusion, Dexamethasone and Ondansetron  Airway Management Planned: Natural Airway  Additional Equipment:   Intra-op Plan:   Post-operative Plan:   Informed Consent: I have reviewed the patients History and Physical, chart, labs and discussed the procedure including the risks, benefits and alternatives for the proposed anesthesia with the patient or authorized representative who has indicated his/her understanding and acceptance.     Dental advisory given  Plan Discussed  with: CRNA  Anesthesia Plan Comments:        Anesthesia Quick Evaluation

## 2023-01-05 NOTE — Transfer of Care (Signed)
Immediate Anesthesia Transfer of Care Note  Patient: [name] [name]  Procedure(s) Performed: RIGHT FEMORAL REVISION (Right)  Patient Location: PACU  Anesthesia Type:Spinal  Level of Consciousness: awake and alert   Airway & Oxygen Therapy: Patient Spontanous Breathing and Patient connected to face mask oxygen  Post-op Assessment: Report given to RN and Post -op Vital signs reviewed and stable  Post vital signs: Reviewed and stable  Last Vitals:  Vitals Value Taken Time  BP 167/152 01/05/23 1741  Temp    Pulse 60 01/05/23 1742  Resp 20 01/05/23 1742  SpO2 100 % 01/05/23 1742  Vitals shown include unfiled device data.  Last Pain:  Vitals:   01/05/23 1305  TempSrc:   PainSc: 0-No pain         Complications: No notable events documented.

## 2023-01-05 NOTE — Anesthesia Procedure Notes (Addendum)
Spinal  Patient location during procedure: OR Start time: 01/05/2023 3:55 PM End time: 01/05/2023 3:57 PM Reason for block: surgical anesthesia Staffing Performed: anesthesiologist  Anesthesiologist: Elmer Picker, MD Performed by: Elmer Picker, MD Authorized by: Elmer Picker, MD   Preanesthetic Checklist Completed: patient identified, IV checked, risks and benefits discussed, surgical consent, monitors and equipment checked, pre-op evaluation and timeout performed Spinal Block Patient position: sitting Prep: DuraPrep and site prepped and draped Patient monitoring: cardiac monitor, continuous pulse ox and blood pressure Approach: midline Location: L3-4 Injection technique: single-shot Needle Needle type: Pencan  Needle gauge: 24 G Needle length: 9 cm Assessment Sensory level: T6 Events: CSF return Additional Notes Functioning IV was confirmed and monitors were applied. Sterile prep and drape, including hand hygiene and sterile gloves were used. The patient was positioned and the spine was prepped. The skin was anesthetized with lidocaine.  Free flow of clear CSF was obtained prior to injecting local anesthetic into the CSF.  The spinal needle aspirated freely following injection.  The needle was carefully withdrawn.  The patient tolerated the procedure well.

## 2023-01-05 NOTE — Anesthesia Procedure Notes (Signed)
Anesthesia Regional Block: Adductor canal block   Pre-Anesthetic Checklist: , timeout performed,  Correct Patient, Correct Site, Correct Laterality,  Correct Procedure, Correct Position, site marked,  Risks and benefits discussed,  Pre-op evaluation,  At surgeon's request and post-op pain management  Laterality: Right  Prep: Maximum Sterile Barrier Precautions used, chloraprep       Needles:  Injection technique: Single-shot  Needle Type: Echogenic Stimulator Needle     Needle Length: 9cm  Needle Gauge: 21     Additional Needles:   Procedures:,,,, ultrasound used (permanent image in chart),,    Narrative:  Start time: 01/05/2023 3:30 PM End time: 01/05/2023 3:33 PM Injection made incrementally with aspirations every 5 mL. Anesthesiologist: Elmer Picker, MD

## 2023-01-05 NOTE — Discharge Instructions (Addendum)
[name] Gross, MD [facility] Specialist [facility] [address] [place], [place] [address] [phone]  KNEE REVISION POSTOPERATIVE DIRECTIONS  Knee Rehabilitation, Guidelines Following Surgery  Results after knee surgery are often greatly improved when you follow the exercise, range of motion and muscle strengthening exercises prescribed by your doctor. Safety measures are also important to protect the knee from further injury. If any of these exercises cause you to have increased pain or swelling in your knee joint, decrease the amount until you are comfortable again and slowly increase them. If you have problems or questions, call your caregiver or physical therapist for advice.   BLOOD CLOT PREVENTION Take a 10 mg Xarelto once a day for three weeks following surgery. Then take an 81 mg Aspirin once a day for three weeks. Then discontinue Aspirin. You may resume your vitamins/supplements once you have discontinued the Xarelto. Do not take any NSAIDs (Advil, Aleve, Ibuprofen, Meloxicam, etc.) until you have discontinued the Xarelto.   GABAPENTIN INSTRUCTIONS Take a 300 mg capsule three times a day for two weeks following surgery.Then take a 300 mg capsule two times a day for two weeks. Then take a 300 mg capsule once a day for two weeks. Then discontinue.  HOME CARE INSTRUCTIONS  Remove items at home which could result in a fall. This includes throw rugs or furniture in walking pathways.  ICE to the affected knee as much as tolerated. Icing helps control swelling. If the swelling is well controlled you will be more comfortable and rehab easier. Continue to use ice on the knee for pain and swelling from surgery. You may notice swelling that will progress down to the foot and ankle. This is normal after surgery. Elevate the leg when you are not up walking on it.    Continue to use the breathing machine which will help keep your temperature down. It is common  for your temperature to cycle up and down following surgery, especially at night when you are not up moving around and exerting yourself. The breathing machine keeps your lungs expanded and your temperature down. Do not place pillow under the operative knee, focus on keeping the knee straight while resting  DIET You may resume your previous home diet once you are discharged from the hospital.  DRESSING / WOUND CARE / SHOWERING Keep your bulky bandage on for 2 days. On the third post-operative day you may remove the Ace bandage and gauze. There is a waterproof adhesive bandage on your skin which will stay in place until your first follow-up appointment. Once you remove this you will not need to place another bandage You may begin showering 3 days following surgery, but do not submerge the incision under water.  ACTIVITY For the first 5 days, the key is rest and control of pain and swelling Do your home exercises twice a day starting on post-operative day 3. On the days you go to physical therapy, just do the home exercises once that day. You should rest, ice and elevate the leg for 50 minutes out of every hour. Get up and walk/stretch for 10 minutes per hour. After 5 days you can increase your activity slowly as tolerated. Walk with your walker as instructed. Use the walker until you are comfortable transitioning to a cane. Walk with the cane in the opposite hand of the operative leg. You may discontinue the cane once you are comfortable and walking steadily. Avoid periods of inactivity such as sitting longer than  an hour when not asleep. This helps prevent blood clots.  You may discontinue the knee immobilizer once you are able to perform a straight leg raise while lying down. You may resume a sexual relationship in one month or when given the OK by your doctor.  You may return to work once you are cleared by your doctor.  Do not drive a car for 6 weeks or until released by your surgeon.  Do not  drive while taking narcotics.  TED HOSE STOCKINGS Wear the elastic stockings on both legs for three weeks following surgery during the day. You may remove them at night for sleeping.  WEIGHT BEARING Weight bearing as tolerated with assist device (walker, cane, etc) as directed, use it as long as suggested by your surgeon or therapist, typically at least 4-6 weeks.  POSTOPERATIVE CONSTIPATION PROTOCOL Constipation - defined medically as fewer than three stools per week and severe constipation as less than one stool per week.  One of the most common issues patients have following surgery is constipation.  Even if you have a regular bowel pattern at home, your normal regimen is likely to be disrupted due to multiple reasons following surgery.  Combination of anesthesia, postoperative narcotics, change in appetite and fluid intake all can affect your bowels.  In order to avoid complications following surgery, here are some recommendations in order to help you during your recovery period.  Colace (docusate) - Pick up an over-the-counter form of Colace or another stool softener and take twice a day as long as you are requiring postoperative pain medications.  Take with a full glass of water daily.  If you experience loose stools or diarrhea, hold the colace until you stool forms back up. If your symptoms do not get better within 1 week or if they get worse, check with your doctor. Dulcolax (bisacodyl) - Pick up over-the-counter and take as directed by the product packaging as needed to assist with the movement of your bowels.  Take with a full glass of water.  Use this product as needed if not relieved by Colace only.  MiraLax (polyethylene glycol) - Pick up over-the-counter to have on hand. MiraLax is a solution that will increase the amount of water in your bowels to assist with bowel movements.  Take as directed and can mix with a glass of water, juice, soda, coffee, or tea. Take if you go more than two  days without a movement. Do not use MiraLax more than once per day. Call your doctor if you are still constipated or irregular after using this medication for 7 days in a row.  If you continue to have problems with postoperative constipation, please contact the office for further assistance and recommendations.  If you experience "the worst abdominal pain ever" or develop nausea or vomiting, please contact the office immediatly for further recommendations for treatment.  ITCHING If you experience itching with your medications, try taking only a single pain pill, or even half a pain pill at a time.  You can also use Benadryl over the counter for itching or also to help with sleep.   MEDICATIONS See your medication summary on the "After Visit Summary" that the nursing staff will review with you prior to discharge.  You may have some home medications which will be placed on hold until you complete the course of blood thinner medication.  It is important for you to complete the blood thinner medication as prescribed by your surgeon.  Continue your approved  medications as instructed at time of discharge.  PRECAUTIONS If you experience chest pain or shortness of breath - call 911 immediately for transfer to the hospital emergency department.  If you develop a fever greater that 101 F, purulent drainage from wound, increased redness or drainage from wound, foul odor from the wound/dressing, or calf pain - CONTACT YOUR SURGEON.                                                   FOLLOW-UP APPOINTMENTS Make sure you keep all of your appointments after your operation with your surgeon and caregivers. You should call the office at the above phone number and make an appointment for approximately two weeks after the date of your surgery or on the date instructed by your surgeon outlined in the "After Visit Summary".  RANGE OF MOTION AND STRENGTHENING EXERCISES  Rehabilitation of the knee is important following a  knee injury or an operation. After just a few days of immobilization, the muscles of the thigh which control the knee become weakened and shrink (atrophy). Knee exercises are designed to build up the tone and strength of the thigh muscles and to improve knee motion. Often times heat used for twenty to thirty minutes before working out will loosen up your tissues and help with improving the range of motion but do not use heat for the first two weeks following surgery. These exercises can be done on a training (exercise) mat, on the floor, on a table or on a bed. Use what ever works the best and is most comfortable for you Knee exercises include:  Leg Lifts - While your knee is still immobilized in a splint or cast, you can do straight leg raises. Lift the leg to 60 degrees, hold for 3 sec, and slowly lower the leg. Repeat 10-20 times 2-3 times daily. Perform this exercise against resistance later as your knee gets better.  Quad and Hamstring Sets - Tighten up the muscle on the front of the thigh (Quad) and hold for 5-10 sec. Repeat this 10-20 times hourly. Hamstring sets are done by pushing the foot backward against an object and holding for 5-10 sec. Repeat as with quad sets.  Leg Slides: Lying on your back, slowly slide your foot toward your buttocks, bending your knee up off the floor (only go as far as is comfortable). Then slowly slide your foot back down until your leg is flat on the floor again. Angel Wings: Lying on your back spread your legs to the side as far apart as you can without causing discomfort.  A rehabilitation program following serious knee injuries can speed recovery and prevent re-injury in the future due to weakened muscles. Contact your doctor or a physical therapist for more information on knee rehabilitation.   POST-OPERATIVE OPIOID TAPER INSTRUCTIONS: It is important to wean off of your opioid medication as soon as possible. If you do not need pain medication after your surgery it  is ok to stop day one. Opioids include: Codeine, Hydrocodone(Norco, Vicodin), Oxycodone(Percocet, oxycontin) and hydromorphone amongst others.  Long term and even short term use of opiods can cause: Increased pain response Dependence Constipation Depression Respiratory depression And more.  Withdrawal symptoms can include Flu like symptoms Nausea, vomiting And more Techniques to manage these symptoms Hydrate well Eat regular healthy meals Stay active Use  relaxation techniques(deep breathing, meditating, yoga) Do Not substitute Alcohol to help with tapering If you have been on opioids for less than two weeks and do not have pain than it is ok to stop all together.  Plan to wean off of opioids This plan should start within one week post op of your joint replacement. Maintain the same interval or time between taking each dose and first decrease the dose.  Cut the total daily intake of opioids by one tablet each day Next start to increase the time between doses. The last dose that should be eliminated is the evening dose.   IF YOU ARE TRANSFERRED TO A SKILLED REHAB FACILITY If the patient is transferred to a skilled rehab facility following release from the hospital, a list of the current medications will be sent to the facility for the patient to continue.  When discharged from the skilled rehab facility, please have the facility set up the patient's Home Health Physical Therapy prior to being released. Also, the skilled facility will be responsible for providing the patient with their medications at time of release from the facility to include their pain medication, the muscle relaxants, and their blood thinner medication. If the patient is still at the rehab facility at time of the two week follow up appointment, the skilled rehab facility will also need to assist the patient in arranging follow up appointment in our office and any transportation needs.  MAKE SURE YOU:  Understand  these instructions.  Get help right away if you are not doing well or get worse.   DENTAL ANTIBIOTICS:  In most cases prophylactic antibiotics for Dental procdeures after total joint surgery are not necessary.  Exceptions are as follows:  1. History of prior total joint infection  2. Severely immunocompromised (Organ Transplant, cancer chemotherapy, Rheumatoid biologic medications such as Humera)  3. Poorly controlled diabetes (A1C &gt; 8.0, blood glucose over 200)  If you have one of these conditions, contact your surgeon for an antibiotic prescription, prior to your dental procedure.    Pick up stool softner and laxative for home use following surgery while on pain medications. Do not submerge incision under water. Please use good hand washing techniques while changing dressing each day. May shower starting three days after surgery. Please use a clean towel to pat the incision dry following showers. Continue to use ice for pain and swelling after surgery. Do not use any lotions or creams on the incision until instructed by your surgeon.    Information on my medicine - XARELTO (Rivaroxaban)   Why was Xarelto prescribed for you? Xarelto was prescribed for you to reduce the risk of blood clots forming after orthopedic surgery. The medical term for these abnormal blood clots is venous thromboembolism (VTE).  What do you need to know about xarelto ? Take your Xarelto ONCE DAILY at the same time every day. You may take it either with or without food.  If you have difficulty swallowing the tablet whole, you may crush it and mix in applesauce just prior to taking your dose.  Take Xarelto exactly as prescribed by your doctor and DO NOT stop taking Xarelto without talking to the doctor who prescribed the medication.  Stopping without other VTE prevention medication to take the place of Xarelto may increase your risk of developing a clot.  After discharge, you should have  regular check-up appointments with your healthcare provider that is prescribing your Xarelto.    What do you do if you miss  a dose? If you miss a dose, take it as soon as you remember on the same day then continue your regularly scheduled once daily regimen the next day. Do not take two doses of Xarelto on the same day.   Important Safety Information A possible side effect of Xarelto is bleeding. You should call your healthcare provider right away if you experience any of the following: Bleeding from an injury or your nose that does not stop. Unusual colored urine (red or dark brown) or unusual colored stools (red or black). Unusual bruising for unknown reasons. A serious fall or if you hit your head (even if there is no bleeding).  Some medicines may interact with Xarelto and might increase your risk of bleeding while on Xarelto. To help avoid this, consult your healthcare provider or pharmacist prior to using any new prescription or non-prescription medications, including herbals, vitamins, non-steroidal anti-inflammatory drugs (NSAIDs) and supplements.  This website has more information on Xarelto: VisitDestination.com.br.

## 2023-01-05 NOTE — Brief Op Note (Signed)
01/05/2023  5:33 PM  PATIENT:  [name] [name]  62 y.o. female  PRE-OPERATIVE DIAGNOSIS:  unstable right total knee arthroplasty  POST-OPERATIVE DIAGNOSIS:  unstable right total knee arthroplasty  PROCEDURE:  Procedure(s): RIGHT FEMORAL REVISION (Right)  SURGEON:  Surgeons and Role:    Ollen Gross, MD - Primary  PHYSICIAN ASSISTANT:   ASSISTANTS: Arcola Jansky, PA-C   ANESTHESIA:   regional and spinal  EBL:  75 mL   BLOOD ADMINISTERED:none  DRAINS: none   LOCAL MEDICATIONS USED:  OTHER Exparel  COUNTS:  YES  TOURNIQUET:   Total Tourniquet Time Documented: Thigh (Right) - 58 minutes Total: Thigh (Right) - 58 minutes   DICTATION: .Other Dictation: Dictation Number [id number]  PLAN OF CARE: Admit to inpatient   PATIENT DISPOSITION:  PACU - hemodynamically stable.

## 2023-01-05 NOTE — Plan of Care (Signed)

## 2023-01-05 NOTE — Interval H&P Note (Signed)
History and Physical Interval Note:  01/05/2023 12:48 PM  [name] [name]  has presented today for surgery, with the diagnosis of unstable right total knee arthroplasty.  The various methods of treatment have been discussed with the patient and family. After consideration of risks, benefits and other options for treatment, the patient has consented to  Procedure(s): FEMORAL REVISION (Right) as a surgical intervention.  The patient's history has been reviewed, patient examined, no change in status, stable for surgery.  I have reviewed the patient's chart and labs.  Questions were answered to the patient's satisfaction.     Homero Fellers Trinidi Toppins

## 2023-01-06 LAB — BASIC METABOLIC PANEL
Anion gap: 9 (ref 5–15)
BUN: 15 mg/dL (ref 6–20)
CO2: 21 mmol/L — ABNORMAL LOW (ref 22–32)
Calcium: 8.2 mg/dL — ABNORMAL LOW (ref 8.9–10.3)
Chloride: 103 mmol/L (ref 98–111)
Creatinine, Ser: 0.57 mg/dL (ref 0.44–1.00)
GFR, Estimated: >60 mL/min (ref >60)
Glucose, Bld: 234 mg/dL — ABNORMAL HIGH (ref 70–99)
Potassium: 4 mmol/L (ref 3.5–5.1)
Sodium: 133 mmol/L — ABNORMAL LOW (ref 135–145)

## 2023-01-06 LAB — CBC
HCT: 34.2 % — ABNORMAL LOW (ref 36.0–46.0)
Hemoglobin: 10.1 g/dL — ABNORMAL LOW (ref 12.0–15.0)
MCH: 24.1 pg — ABNORMAL LOW (ref 26.0–34.0)
MCHC: 29.5 g/dL — ABNORMAL LOW (ref 30.0–36.0)
MCV: 81.6 fL (ref 80.0–100.0)
Platelets: 239 10*3/uL (ref 150–400)
RBC: 4.19 MIL/uL (ref 3.87–5.11)
RDW: 16.6 % — ABNORMAL HIGH (ref 11.5–15.5)
WBC: 10.6 10*3/uL — ABNORMAL HIGH (ref 4.0–10.5)
nRBC: 0 % (ref 0.0–0.2)

## 2023-01-06 NOTE — Progress Notes (Signed)
   Subjective: 1 Day Post-Op Procedure(s) (LRB): RIGHT FEMORAL REVISION (Right) Patient reports pain as mild.   Patient seen in rounds by Dr. Lequita Halt. Patient is doing well considering the extent of surgery she had yesterday. No issues overnight. Denies chest pain/SOB. We will begin therapy today.   Objective: Vital signs in last 24 hours: Temp:  [97.1 F (36.2 C)-98 F (36.7 C)] 97.7 F (36.5 C) (10/01 0639) Pulse Rate:  [51-79] 72 (10/01 0639) Resp:  [11-45] 15 (10/01 0639) BP: (101-165)/(43-96) 111/56 (10/01 0639) SpO2:  [93 %-100 %] 97 % (10/01 0639) Weight:  [69.4 kg] 69.4 kg (09/30 1305)  Intake/Output from previous day:  Intake/Output Summary (Last 24 hours) at 01/06/2023 0831 Last data filed at 01/06/2023 0600 Gross per 24 hour  Intake 2806.25 ml  Output 1625 ml  Net 1181.25 ml     Intake/Output this shift: No intake/output data recorded.  Labs: Recent Labs    01/06/23 0348  HGB 10.1*   Recent Labs    01/06/23 0348  WBC 10.6*  RBC 4.19  HCT 34.2*  PLT 239   Recent Labs    01/06/23 0348  NA 133*  K 4.0  CL 103  CO2 21*  BUN 15  CREATININE 0.57  GLUCOSE 234*  CALCIUM 8.2*   No results for input(s): "LABPT", "INR" in the last 72 hours.  Exam: General - Patient is Alert and Oriented Extremity - Neurologically intact Neurovascular intact Sensation intact distally Dorsiflexion/Plantar flexion intact Dressing - dressing C/D/I Motor Function - intact, moving foot and toes well on exam.   Past Medical History:  Diagnosis Date   Complication of anesthesia    "irregular heartbeat" during neck surgery   Depression    Fibroid    Neuromuscular disorder (HCC)    Pre-diabetes    Rheumatoid arthritis (HCC)    Urinary incontinence     Assessment/Plan: 1 Day Post-Op Procedure(s) (LRB): RIGHT FEMORAL REVISION (Right) Principal Problem:   Failed total left knee replacement (HCC) Active Problems:   Failed total knee arthroplasty  (HCC)  Estimated body mass index is 27.98 kg/m as calculated from the following:   Height as of this encounter: 5\' 2"  (1.575 m).   Weight as of this encounter: 69.4 kg. Advance diet Up with therapy D/C IV fluids  DVT Prophylaxis - Xarelto Weight bearing as tolerated. Begin  Plan is to go Home after hospital stay. Plan for discharge possibly tomorrow depending on her progression with therapy  Arther Abbott, PA-C Orthopedic Surgery [phone] 01/06/2023, 8:31 AM

## 2023-01-06 NOTE — Evaluation (Signed)
Physical Therapy Evaluation Patient Details Name: [name] [name] MRN: [id number] DOB: 1960-12-30 Today's Date: 01/06/2023  History of Present Illness  62 yo female s/p Right femoral revision on 01/05/23. PMH:  RA, cervical fusion, bil TKA with bil revisions  Clinical Impression  Pt is s/p revision femoral component R knee resulting in the deficits listed below (see PT Problem List).  Pt doing well at time of eval, known to this PT from previous adm. amb ~120' with RW and CGA without incr pain; pt feels she will need another day to progress to ascending stairs. Continue PT in acute setting  Pt will benefit from acute skilled PT to increase their independence and safety with mobility to allow discharge.          If plan is discharge home, recommend the following: A little help with walking and/or transfers;A little help with bathing/dressing/bathroom;Assistance with cooking/housework;Help with stairs or ramp for entrance;Assist for transportation   Can travel by private vehicle        Equipment Recommendations None recommended by PT  Recommendations for Other Services       Functional Status Assessment Patient has had a recent decline in their functional status and demonstrates the ability to make significant improvements in function in a reasonable and predictable amount of time.     Precautions / Restrictions Precautions Precautions: Fall;Knee Required Braces or Orthoses: Knee Immobilizer - Right Knee Immobilizer - Right: Discontinue once straight leg raise with < 10 degree lag Restrictions Weight Bearing Restrictions: No RLE Weight Bearing: Weight bearing as tolerated      Mobility  Bed Mobility Overal bed mobility: Needs Assistance Bed Mobility: Supine to Sit     Supine to sit: Contact guard     General bed mobility comments: for lines and safety    Transfers Overall transfer level: Needs assistance Equipment used: Rolling walker (2 wheels) Transfers:  Sit to/from Stand Sit to Stand: Contact guard assist           General transfer comment: cues for hand placement    Ambulation/Gait Ambulation/Gait assistance: Contact guard assist Gait Distance (Feet): 120 Feet (15' more) Assistive device: Rolling walker (2 wheels) Gait Pattern/deviations: Step-to pattern       General Gait Details: cues for sequence, CGA for safety, to steady initially  Stairs            Wheelchair Mobility     Tilt Bed    Modified Rankin (Stroke Patients Only)       Balance                                             Pertinent Vitals/Pain Pain Assessment Pain Assessment: Faces Faces Pain Scale: Hurts little more Pain Location: right knee Pain Descriptors / Indicators: Sore Pain Intervention(s): Limited activity within patient's tolerance, Monitored during session, Premedicated before session, Repositioned    Home Living Family/patient expects to be discharged to:: Private residence Living Arrangements: Alone Available Help at Discharge: Available PRN/intermittently Type of Home: House Home Access: Stairs to enter Entrance Stairs-Rails: Left Entrance Stairs-Number of Steps: 1+1+1 Alternate Level Stairs-Number of Steps: has stair lift Home Layout: Two level;1/2 bath on main level Home Equipment: Agricultural consultant (2 wheels);Cane - single point;Crutches;Tub bench      Prior Function               Mobility Comments: ambulating household  distances with RW, prefers to use her PFRW       Extremity/Trunk Assessment   Upper Extremity Assessment Upper Extremity Assessment: Overall WFL for tasks assessed (baseline difficulty with grip strength)    Lower Extremity Assessment Lower Extremity Assessment: RLE deficits/detail RLE Deficits / Details: ankle grossly WFL/baseline; knee extension and hip flexion grossly 3/5. AAROM 20 to 60 degrees knee flexion       Communication   Communication Communication: No  apparent difficulties  Cognition Arousal: Alert Behavior During Therapy: WFL for tasks assessed/performed Overall Cognitive Status: Within Functional Limits for tasks assessed                                          General Comments      Exercises Total Joint Exercises Ankle Circles/Pumps: AROM, Both, 10 reps   Assessment/Plan    PT Assessment Patient needs continued PT services  PT Problem List Decreased strength;Decreased range of motion;Decreased activity tolerance;Decreased mobility;Decreased knowledge of precautions;Pain;Decreased knowledge of use of DME       PT Treatment Interventions DME instruction;Gait training;Functional mobility training;Therapeutic activities;Therapeutic exercise;Patient/family education;Stair training    PT Goals (Current goals can be found in the Care Plan section)  Acute Rehab PT Goals Patient Stated Goal: stable knee PT Goal Formulation: With patient Time For Goal Achievement: 01/13/23 Potential to Achieve Goals: Good    Frequency 7X/week     Co-evaluation               AM-PAC PT "6 Clicks" Mobility  Outcome Measure Help needed turning from your back to your side while in a flat bed without using bedrails?: A Little Help needed moving from lying on your back to sitting on the side of a flat bed without using bedrails?: A Little Help needed moving to and from a bed to a chair (including a wheelchair)?: A Little Help needed standing up from a chair using your arms (e.g., wheelchair or bedside chair)?: A Little Help needed to walk in hospital room?: A Little Help needed climbing 3-5 steps with a railing? : A Lot 6 Click Score: 17    End of Session Equipment Utilized During Treatment: Gait belt Activity Tolerance: Patient tolerated treatment well Patient left: in chair;with call bell/phone within reach;with chair alarm set Nurse Communication: Mobility status PT Visit Diagnosis: Other abnormalities of gait and  mobility (R26.89);Difficulty in walking, not elsewhere classified (R26.2)    Time: 5409-8119 PT Time Calculation (min) (ACUTE ONLY): 25 min   Charges:   PT Evaluation $PT Eval Low Complexity: 1 Low PT Treatments $Gait Training: 8-22 mins PT General Charges $$ ACUTE PT VISIT: 1 Visit         Romulus Hanrahan, PT  Acute Rehab Dept [facility]) [phone]  01/06/2023   [facility] 01/06/2023, 10:24 AM

## 2023-01-06 NOTE — TOC Transition Note (Signed)
Transition of Care [facility]) - CM/SW Discharge Note  Patient Details  Name: [name] [name] MRN: [id number] Date of Birth: 1960-04-21  Transition of Care [facility]) CM/SW Contact:  Ewing Schlein, LCSW Phone Number: 01/06/2023, 11:32 AM  Clinical Narrative: Patient is expected to discharge home after working with PT. CSW met with patient to confirm discharge plan and needs. Patient has a rolling walker at home, so there are no DME needs at this time. Patient will need HHPT set up and she declined a referral to [place]. Patient agreeable to referral to [facility]. CSW made HHPT referral to [facility] with [facility], which was accepted. HH orders placed by PA. TOC signing off.   Final next level of care: Home w Home Health Services Barriers to Discharge: No Barriers Identified  Patient Goals and CMS Choice CMS Medicare.gov Compare Post Acute Care list provided to:: Patient Choice offered to / list presented to : Patient  Discharge Plan and Services Additional resources added to the After Visit Summary for          DME Arranged: N/A DME Agency: NA HH Arranged: PT HH Agency: [facility] Date HH Agency Contacted: 01/06/23 Time HH Agency Contacted: 1122 Representative spoke with at [facility] Agency: Morrie Sheldon  Social Determinants of Health (SDOH) Interventions SDOH Screenings   Food Insecurity: No Food Insecurity (01/05/2023)  Recent Concern: Food Insecurity - Food Insecurity Present (11/01/2022)  Housing: Low Risk  (01/05/2023)  Transportation Needs: No Transportation Needs (01/05/2023)  Utilities: Not At Risk (01/05/2023)  Tobacco Use: Low Risk  (01/05/2023)   Readmission Risk Interventions    07/14/2022    3:11 PM  Readmission Risk Prevention Plan  Post Dischage Appt Complete  Medication Screening Complete  Transportation Screening Complete

## 2023-01-06 NOTE — Progress Notes (Signed)
Physical Therapy Treatment Patient Details Name: [name] [name] MRN: [id number] DOB: 09-18-1960 Today's Date: 01/06/2023   History of Present Illness 62 yo female s/p Right femoral revision on 01/05/23. PMH:  RA, cervical fusion, bil TKA with bil revisions    PT Comments  Pt progressing, motivated to incr amb distance this pm, tol well without incr pain. Reviewed importance of terminal knee extension, pt familiar with TKA exercises. Continue PT in am, pt hopeful to d/c tomorrow     If plan is discharge home, recommend the following: A little help with walking and/or transfers;A little help with bathing/dressing/bathroom;Assistance with cooking/housework;Help with stairs or ramp for entrance;Assist for transportation   Can travel by private vehicle        Equipment Recommendations  None recommended by PT    Recommendations for Other Services       Precautions / Restrictions Precautions Precautions: Fall;Knee Precaution Comments: able to SLR without KI Restrictions Weight Bearing Restrictions: No RLE Weight Bearing: Weight bearing as tolerated     Mobility  Bed Mobility Overal bed mobility: Needs Assistance Bed Mobility: Supine to Sit     Supine to sit: Contact guard     General bed mobility comments: pt om recliner and returned to same    Transfers Overall transfer level: Needs assistance Equipment used: Rolling walker (2 wheels) Transfers: Sit to/from Stand, Bed to chair/wheelchair/BSC Sit to Stand: Contact guard assist   Step pivot transfers: Min assist, Contact guard assist       General transfer comment: cues for hand placement; pt pulled on PFRW to stand with posterior LOB requiring min  assist to lower back to [facility] and then return to standing with CGA    Ambulation/Gait Ambulation/Gait assistance: Contact guard assist Gait Distance (Feet): 240 Feet Assistive device: Rolling walker (2 wheels) Gait Pattern/deviations: Step-to pattern        General Gait Details: cues for sequence, CGA for safety, no overt LOB during amb   Stairs             Wheelchair Mobility     Tilt Bed    Modified Rankin (Stroke Patients Only)       Balance                                            Cognition Arousal: Alert Behavior During Therapy: WFL for tasks assessed/performed Overall Cognitive Status: Within Functional Limits for tasks assessed                                 General Comments: very pleasant and motivated        Exercises Total Joint Exercises Ankle Circles/Pumps: AROM, Both, 10 reps Quad Sets: AROM, Both, 10 reps Heel Slides: AAROM, Right, 5 reps Long Arc Quad:  (3 reps AROM, seated)    General Comments        Pertinent Vitals/Pain Pain Assessment Pain Assessment: Faces Faces Pain Scale: Hurts a little bit Pain Location: right knee Pain Descriptors / Indicators: Sore Pain Intervention(s): Limited activity within patient's tolerance, Monitored during session, Premedicated before session    Home Living                          Prior Function  PT Goals (current goals can now be found in the care plan section) Acute Rehab PT Goals Patient Stated Goal: stable knee PT Goal Formulation: With patient Time For Goal Achievement: 01/13/23 Potential to Achieve Goals: Good Progress towards PT goals: Progressing toward goals    Frequency    7X/week      PT Plan      Co-evaluation              AM-PAC PT "6 Clicks" Mobility   Outcome Measure  Help needed turning from your back to your side while in a flat bed without using bedrails?: A Little Help needed moving from lying on your back to sitting on the side of a flat bed without using bedrails?: A Little Help needed moving to and from a bed to a chair (including a wheelchair)?: A Little Help needed standing up from a chair using your arms (e.g., wheelchair or bedside chair)?: A  Little Help needed to walk in hospital room?: A Little Help needed climbing 3-5 steps with a railing? : A Lot 6 Click Score: 17    End of Session Equipment Utilized During Treatment: Gait belt Activity Tolerance: Patient tolerated treatment well Patient left: in chair;with call bell/phone within reach;with chair alarm set   PT Visit Diagnosis: Other abnormalities of gait and mobility (R26.89);Difficulty in walking, not elsewhere classified (R26.2)     Time: 7829-5621 PT Time Calculation (min) (ACUTE ONLY): 42 min  Charges:    $Gait Training: 23-37 mins $Therapeutic Activity: 8-22 mins PT General Charges $$ ACUTE PT VISIT: 1 Visit                     Nai Dasch, PT  Acute Rehab Dept [facility]) [phone]  01/06/2023    [facility] 01/06/2023, 4:52 PM

## 2023-01-07 ENCOUNTER — Other Ambulatory Visit (HOSPITAL_COMMUNITY): Payer: Self-pay

## 2023-01-07 LAB — CBC
HCT: 30.9 % — ABNORMAL LOW (ref 36.0–46.0)
Hemoglobin: 9.4 g/dL — ABNORMAL LOW (ref 12.0–15.0)
MCH: 24.1 pg — ABNORMAL LOW (ref 26.0–34.0)
MCHC: 30.4 g/dL (ref 30.0–36.0)
MCV: 79.2 fL — ABNORMAL LOW (ref 80.0–100.0)
Platelets: 260 10*3/uL (ref 150–400)
RBC: 3.9 MIL/uL (ref 3.87–5.11)
RDW: 16.2 % — ABNORMAL HIGH (ref 11.5–15.5)
WBC: 15.1 10*3/uL — ABNORMAL HIGH (ref 4.0–10.5)
nRBC: 0 % (ref 0.0–0.2)

## 2023-01-07 MED ORDER — GABAPENTIN 300 MG PO CAPS
ORAL_CAPSULE | ORAL | 0 refills | Status: AC
  Filled 2023-01-07: qty 84, 42d supply, fill #0

## 2023-01-07 MED ORDER — ONDANSETRON HCL 4 MG PO TABS
4.0000 mg | ORAL_TABLET | Freq: Four times a day (QID) | ORAL | 0 refills | Status: AC | PRN
  Filled 2023-01-07: qty 20, 5d supply, fill #0

## 2023-01-07 MED ORDER — OXYCODONE HCL 5 MG PO TABS
5.0000 mg | ORAL_TABLET | Freq: Four times a day (QID) | ORAL | 0 refills | Status: AC | PRN
  Filled 2023-01-07: qty 42, 6d supply, fill #0

## 2023-01-07 MED ORDER — METHOCARBAMOL 500 MG PO TABS
500.0000 mg | ORAL_TABLET | Freq: Four times a day (QID) | ORAL | 0 refills | Status: AC | PRN
  Filled 2023-01-07: qty 40, 10d supply, fill #0

## 2023-01-07 MED ORDER — RIVAROXABAN 10 MG PO TABS
10.0000 mg | ORAL_TABLET | Freq: Every day | ORAL | 0 refills | Status: AC
  Filled 2023-01-07: qty 19, 19d supply, fill #0

## 2023-01-07 NOTE — Progress Notes (Signed)
PT TX NOTE  01/07/23 1215  PT Visit Information  Assistance Needed Reviewed TKA HEP, pt tol well with incr pain only with work on end rang extension and flexion; pt is motivated to d/c home today and continue hre rehab  History of Present Illness 62 yo female s/p Right femoral revision on 01/05/23. PMH:  RA, cervical fusion, bil TKA with bil revisions  Subjective Data  Patient Stated Goal stable knee  Precautions  Precautions Fall;Knee  Precaution Comments able to SLR without KI  Restrictions  Weight Bearing Restrictions No  RLE Weight Bearing WBAT  Pain Assessment  Pain Assessment Faces  Faces Pain Scale 4  Pain Location right knee with exercises  Pain Descriptors / Indicators Sore  Pain Intervention(s) Limited activity within patient's tolerance;Monitored during session;Premedicated before session  Cognition  Arousal Alert  Behavior During Therapy WFL for tasks assessed/performed  Overall Cognitive Status Within Functional Limits for tasks assessed  General Comments very pleasant and motivated  Total Joint Exercises  Ankle Circles/Pumps AROM;Both;10 reps  Quad Sets AROM;Both;10 reps  Heel Slides AAROM;AROM;Right;10 reps  Hip ABduction/ADduction AROM;AAROM;Strengthening;15 reps  Straight Leg Raises AROM;Strengthening;Right;10 reps  Goniometric ROM grossly 20 to 90 degrees flexion  PT - End of Session  Activity Tolerance Patient tolerated treatment well  Patient left in chair;with call bell/phone within reach;with chair alarm set   PT - Assessment/Plan  PT Visit Diagnosis Other abnormalities of gait and mobility (R26.89);Difficulty in walking, not elsewhere classified (R26.2)  PT Frequency (ACUTE ONLY) 7X/week  Follow Up Recommendations Follow physician's recommendations for discharge plan and follow up therapies  Patient can return home with the following A little help with walking and/or transfers;A little help with bathing/dressing/bathroom;Assistance with  cooking/housework;Help with stairs or ramp for entrance;Assist for transportation  PT equipment None recommended by PT  AM-PAC PT "6 Clicks" Mobility Outcome Measure (Version 2)  Help needed turning from your back to your side while in a flat bed without using bedrails? 4  Help needed moving from lying on your back to sitting on the side of a flat bed without using bedrails? 3  Help needed moving to and from a bed to a chair (including a wheelchair)? 3  Help needed standing up from a chair using your arms (e.g., wheelchair or bedside chair)? 3  Help needed to walk in hospital room? 3  Help needed climbing 3-5 steps with a railing?  3  6 Click Score 19  Consider Recommendation of Discharge To: Home with [facility]  Progressive Mobility  What is the highest level of mobility based on the progressive mobility assessment? Level 5 (Walks with assist in room/hall) - Balance while stepping forward/back and can walk in room with assist - Complete  Activity Ambulated with assistance to bathroom  PT Goal Progression  Progress towards PT goals Progressing toward goals  Acute Rehab PT Goals  PT Goal Formulation With patient  Time For Goal Achievement 01/13/23  Potential to Achieve Goals Good  PT Time Calculation  PT Start Time (ACUTE ONLY) 1225  PT Stop Time (ACUTE ONLY) 1236  PT Time Calculation (min) (ACUTE ONLY) 11 min  PT General Charges  $$ ACUTE PT VISIT 1 Visit  PT Treatments  $Therapeutic Exercise 8-22 mins

## 2023-01-07 NOTE — TOC Transition Note (Signed)
Transition of Care [facility]) - CM/SW Discharge Note  Patient Details  Name: Genna Casimir MRN: [id number] Date of Birth: 01/08/1961  Transition of Care [facility]) CM/SW Contact:  Ewing Schlein, LCSW Phone Number: 01/07/2023, 11:07 AM  Clinical Narrative: Patient will discharge home today. CSW notified Morrie Sheldon with [facility] regarding discharge. TOC signing off.  Final next level of care: Home w Home Health Services Barriers to Discharge: No Barriers Identified  Patient Goals and CMS Choice CMS Medicare.gov Compare Post Acute Care list provided to:: Patient Choice offered to / list presented to : Patient  Discharge Plan and Services Additional resources added to the After Visit Summary for          DME Arranged: N/A DME Agency: NA HH Arranged: PT HH Agency: [facility]) Date HH Agency Contacted: 01/06/23 Time HH Agency Contacted: 1122 Representative spoke with at [facility] Agency: Morrie Sheldon  Social Determinants of Health (SDOH) Interventions SDOH Screenings   Food Insecurity: No Food Insecurity (01/05/2023)  Recent Concern: Food Insecurity - Food Insecurity Present (11/01/2022)  Housing: Low Risk  (01/05/2023)  Transportation Needs: No Transportation Needs (01/05/2023)  Utilities: Not At Risk (01/05/2023)  Tobacco Use: Low Risk  (01/05/2023)   Readmission Risk Interventions    07/14/2022    3:11 PM  Readmission Risk Prevention Plan  Post Dischage Appt Complete  Medication Screening Complete  Transportation Screening Complete

## 2023-01-07 NOTE — Progress Notes (Signed)
   Subjective: 2 Days Post-Op Procedure(s) (LRB): RIGHT FEMORAL REVISION (Right) Patient reports pain as mild.   Patient seen in rounds by Dr. Lequita Halt. Patient is well, and has had no acute complaints or problems Plan is to go Home after hospital stay.  Objective: Vital signs in last 24 hours: Temp:  [98 F (36.7 C)-98.4 F (36.9 C)] 98 F (36.7 C) (10/02 0554) Pulse Rate:  [63-75] 63 (10/02 0554) Resp:  [16-18] 17 (10/02 0554) BP: (101-129)/(59-76) 125/76 (10/02 0554) SpO2:  [96 %-100 %] 96 % (10/02 0554)  Intake/Output from previous day:  Intake/Output Summary (Last 24 hours) at 01/07/2023 0842 Last data filed at 01/06/2023 1855 Gross per 24 hour  Intake 1098.75 ml  Output 100 ml  Net 998.75 ml    Intake/Output this shift: No intake/output data recorded.  Labs: Recent Labs    01/06/23 0348 01/07/23 0329  HGB 10.1* 9.4*   Recent Labs    01/06/23 0348 01/07/23 0329  WBC 10.6* 15.1*  RBC 4.19 3.90  HCT 34.2* 30.9*  PLT 239 260   Recent Labs    01/06/23 0348  NA 133*  K 4.0  CL 103  CO2 21*  BUN 15  CREATININE 0.57  GLUCOSE 234*  CALCIUM 8.2*   No results for input(s): "LABPT", "INR" in the last 72 hours.  Exam: General - Patient is Alert and Oriented Extremity - Neurologically intact Neurovascular intact Sensation intact distally Dorsiflexion/Plantar flexion intact Dressing/Incision - clean, dry, no drainage Motor Function - intact, moving foot and toes well on exam.   Past Medical History:  Diagnosis Date   Complication of anesthesia    "irregular heartbeat" during neck surgery   Depression    Fibroid    Neuromuscular disorder (HCC)    Pre-diabetes    Rheumatoid arthritis (HCC)    Urinary incontinence     Assessment/Plan: 2 Days Post-Op Procedure(s) (LRB): RIGHT FEMORAL REVISION (Right) Principal Problem:   Failed total left knee replacement (HCC) Active Problems:   Failed total knee arthroplasty (HCC)  Estimated body mass index  is 27.98 kg/m as calculated from the following:   Height as of this encounter: 5\' 2"  (1.575 m).   Weight as of this encounter: 69.4 kg. Up with therapy  DVT Prophylaxis - Xarelto Weight-bearing as tolerated  Discharge to home with HHPT Follow-up in the office in 2 weeks  The PDMP database was reviewed today prior to any opioid medications being prescribed to this patient.   Arther Abbott, PA-C Orthopedic Surgery [phone] 01/07/2023, 8:42 AM

## 2023-01-07 NOTE — Progress Notes (Signed)
Physical Therapy Treatment Patient Details Name: [name] [name] MRN: [id number] DOB: 1960/08/25 Today's Date: 01/07/2023   History of Present Illness 62 yo female s/p Right femoral revision on 01/05/23. PMH:  RA, cervical fusion, bil TKA with bil revisions    PT Comments  Excellent progress this session, pt is meeting PT goals and feels ready for d/c home today. Continue PT POC until d/c  with HHPT f/u    If plan is discharge home, recommend the following: A little help with walking and/or transfers;A little help with bathing/dressing/bathroom;Assistance with cooking/housework;Help with stairs or ramp for entrance;Assist for transportation   Can travel by private vehicle        Equipment Recommendations  None recommended by PT    Recommendations for Other Services       Precautions / Restrictions Precautions Precautions: Fall;Knee Precaution Comments: able to SLR without KI Restrictions Weight Bearing Restrictions: No RLE Weight Bearing: Weight bearing as tolerated     Mobility  Bed Mobility Overal bed mobility: Needs Assistance Bed Mobility: Supine to Sit     Supine to sit: Supervision, Modified independent (Device/Increase time)     General bed mobility comments: no physical assist    Transfers Overall transfer level: Needs assistance Equipment used: Rolling walker (2 wheels) Transfers: Sit to/from Stand, Bed to chair/wheelchair/BSC Sit to Stand: Contact guard assist, Supervision           General transfer comment: cues for hand placement; good pt effort with close supervision for safety. no LOB with STS from bed and BSC    Ambulation/Gait Ambulation/Gait assistance: Contact guard assist, Supervision Gait Distance (Feet): 250 Feet Assistive device: Rolling walker (2 wheels) Gait Pattern/deviations: Step-to pattern       General Gait Details: cues for sequence, CGA to supervision  for safety, no overt LOB during amb   Stairs Stairs:  Yes Stairs assistance: Contact guard assist Stair Management: One rail Right, One rail Left, Step to pattern, Sideways Number of Stairs: 2 General stair comments: cues for sequence and use of single rail, no knee bucklign no LOB; CGA for safety, assist only to manage RW   Wheelchair Mobility     Tilt Bed    Modified Rankin (Stroke Patients Only)       Balance                                            Cognition Arousal: Alert Behavior During Therapy: WFL for tasks assessed/performed Overall Cognitive Status: Within Functional Limits for tasks assessed                                 General Comments: very pleasant and motivated        Exercises Total Joint Exercises Ankle Circles/Pumps: AROM, Both, 10 reps    General Comments        Pertinent Vitals/Pain Pain Assessment Faces Pain Scale: Hurts a little bit Pain Location: right knee Pain Descriptors / Indicators: Sore    Home Living                          Prior Function            PT Goals (current goals can now be found in the care plan section) Acute Rehab PT Goals Patient  Stated Goal: stable knee PT Goal Formulation: With patient Time For Goal Achievement: 01/13/23 Potential to Achieve Goals: Good Progress towards PT goals: Progressing toward goals    Frequency    7X/week      PT Plan      Co-evaluation              AM-PAC PT "6 Clicks" Mobility   Outcome Measure  Help needed turning from your back to your side while in a flat bed without using bedrails?: None Help needed moving from lying on your back to sitting on the side of a flat bed without using bedrails?: A Little Help needed moving to and from a bed to a chair (including a wheelchair)?: A Little Help needed standing up from a chair using your arms (e.g., wheelchair or bedside chair)?: A Little Help needed to walk in hospital room?: A Little Help needed climbing 3-5 steps with a  railing? : A Little 6 Click Score: 19    End of Session Equipment Utilized During Treatment: Gait belt Activity Tolerance: Patient tolerated treatment well Patient left: in chair;with call bell/phone within reach;with chair alarm set   PT Visit Diagnosis: Other abnormalities of gait and mobility (R26.89);Difficulty in walking, not elsewhere classified (R26.2)     Time: 1610-9604 PT Time Calculation (min) (ACUTE ONLY): 32 min  Charges:    $Gait Training: 23-37 mins PT General Charges $$ ACUTE PT VISIT: 1 Visit                     Selby Slovacek, PT  Acute Rehab Dept [facility]) [phone]  01/07/2023    [facility] 01/07/2023, 10:26 AM

## 2023-01-07 NOTE — Plan of Care (Signed)

## 2023-01-07 NOTE — Anesthesia Postprocedure Evaluation (Signed)
Anesthesia Post Note  Patient: [name], therapeutic  Procedure(s) Performed: RIGHT FEMORAL REVISION (Right)     Patient location during evaluation: PACU Anesthesia Type: Regional and Spinal Level of consciousness: oriented and awake and alert Pain management: pain level controlled Vital Signs Assessment: post-procedure vital signs reviewed and stable Respiratory status: spontaneous breathing, respiratory function stable and patient connected to nasal cannula oxygen Cardiovascular status: blood pressure returned to baseline and stable Postop Assessment: no headache, no backache and no apparent nausea or vomiting Anesthetic complications: no  No notable events documented.  Last Vitals:  Vitals:   01/06/23 2215 01/07/23 0554  BP: (!) 101/59 125/76  Pulse: 75 63  Resp: 17 17  Temp: 36.9 C 36.7 C  SpO2: 96% 96%    Last Pain:  Vitals:   01/07/23 0554  TempSrc: Oral  PainSc:                  Lilburn Straw L Prerna Harold

## 2023-01-08 ENCOUNTER — Other Ambulatory Visit (HOSPITAL_COMMUNITY): Payer: Self-pay

## 2023-01-08 ENCOUNTER — Encounter (HOSPITAL_COMMUNITY): Payer: Self-pay | Admitting: Orthopedic Surgery

## 2023-01-10 LAB — AEROBIC/ANAEROBIC CULTURE W GRAM STAIN (SURGICAL/DEEP WOUND): Culture: NO GROWTH

## 2023-01-19 NOTE — Discharge Summary (Signed)
Patient ID: [name] [name] MRN: [id number] DOB/AGE: [birth year] 62 y.o.  Admit date: 01/05/2023 Discharge date: 01/07/2023  Admission Diagnoses:  Principal Problem:   Failed total left knee replacement (HCC) Active Problems:   Failed total knee arthroplasty [facility])   Discharge Diagnoses:  Same  Past Medical History:  Diagnosis Date   Complication of anesthesia    "irregular heartbeat" during neck surgery   Depression    Fibroid    Neuromuscular disorder (HCC)    Pre-diabetes    Rheumatoid arthritis (HCC)    Urinary incontinence     Surgeries: Procedure(s): RIGHT FEMORAL REVISION on 01/05/2023   Consultants:   Discharged Condition: Improved  Hospital Course: [name] [name] is an 62 y.o. female who was admitted 01/05/2023 for operative treatment ofFailed total left knee replacement (HCC). Patient has severe unremitting pain that affects sleep, daily activities, and work/hobbies. After pre-op clearance the patient was taken to the operating room on 01/05/2023 and underwent  Procedure(s): RIGHT FEMORAL REVISION.    Patient was given perioperative antibiotics:  Anti-infectives (From admission, onward)    Start     Dose/Rate Route Frequency Ordered Stop   01/06/23 1000  levofloxacin (LEVAQUIN) tablet 750 mg  Status:  Discontinued        750 mg Oral Daily 01/05/23 1849 01/07/23 1842   01/06/23 0600  ceFAZolin (ANCEF) IVPB 2g/100 mL premix        2 g 200 mL/hr over 30 Minutes Intravenous On call to O.R. 01/05/23 1230 01/05/23 1558   01/05/23 2200  ceFAZolin (ANCEF) IVPB 2g/100 mL premix        2 g 200 mL/hr over 30 Minutes Intravenous Every 6 hours 01/05/23 1849 01/06/23 0420        Patient was given sequential compression devices, early ambulation, and chemoprophylaxis to prevent DVT.  Patient benefited maximally from hospital stay and there were no complications.    Recent vital signs: No data found.   Recent laboratory studies: No results for input(s):  "WBC", "HGB", "HCT", "PLT", "NA", "K", "CL", "CO2", "BUN", "CREATININE", "GLUCOSE", "INR", "CALCIUM" in the last 72 hours.  Invalid input(s): "PT", "2"   Discharge Medications:   Allergies as of 01/07/2023       Reactions   Sulfa Antibiotics Shortness Of Breath   Morphine Rash   Penicillins Rash   Tolerated Cephalosporin Date: 12/05/21. Tolerated Cephalosporin Date: 11/07/22. Tolerated Cephalosporin Date: 01/07/23.        Medication List     STOP taking these medications    CALCIUM + D PO   cyanocobalamin 1000 MCG tablet Commonly known as: VITAMIN B12   Fish Oil 1200 MG Caps   folic acid 1 MG tablet Commonly known as: FOLVITE   VITAMIN D PO   Vitamin E 450 MG (1000 UT) Caps       TAKE these medications    acetaminophen 500 MG tablet Commonly known as: TYLENOL Take 1,000 mg by mouth every 6 (six) hours as needed for pain.   citalopram 20 MG tablet Commonly known as: CeleXA Take 1 tablet (20 mg total) by mouth daily.   gabapentin 300 MG capsule Commonly known as: NEURONTIN Take a 300 mg capsule three times a day for two weeks following surgery.Then take a 300 mg capsule two times a day for two weeks. Then take a 300 mg capsule once a day for two weeks. Then discontinue.   levofloxacin 750 MG tablet Commonly known as: LEVAQUIN Take 750 mg by mouth daily. Take daily for  30 days   methocarbamol 500 MG tablet Commonly known as: ROBAXIN Take 1 tablet (500 mg total) by mouth every 6 (six) hours as needed for muscle spasms.   methotrexate 2.5 MG tablet Commonly known as: RHEUMATREX TAKE 7 TABLETS(17.5 MG TOTAL) BY MOUTH ONCE A WEEK. PROTECT FROM LIGHT   Myrbetriq 50 MG Tb24 tablet Generic drug: mirabegron ER Take 50 mg by mouth daily.   ondansetron 4 MG tablet Commonly known as: ZOFRAN Take 1 tablet (4 mg total) by mouth every 6 (six) hours as needed for nausea.   Orencia ClickJect 125 MG/ML Soaj Generic drug: Abatacept INJECT ONE CLICKJECT PEN UNDER  THE SKIN EVERY WEEK. REFRIGERATE. ALLOW TO WARM TO ROOM TEMPERATURE PRIOR TO ADMINISTRATION.   oxyCODONE 5 MG immediate release tablet Commonly known as: Oxy IR/ROXICODONE Take 1-2 tablets (5-10 mg total) by mouth every 6 (six) hours as needed for severe pain.   predniSONE 5 MG tablet Commonly known as: DELTASONE Take 5 mg by mouth daily with breakfast.   rivaroxaban 10 MG Tabs tablet Commonly known as: XARELTO Take 1 tablet (10 mg total) by mouth daily with breakfast for 19 days. Then take one 81 mg aspirin once a day for three weeks. Then discontinue aspirin.               Discharge Care Instructions  (From admission, onward)           Start     Ordered   01/07/23 0000  Weight bearing as tolerated        01/07/23 0913   01/07/23 0000  Change dressing       Comments: You may remove the bulky bandage (ACE wrap and gauze) two days after surgery. You will have an adhesive waterproof bandage underneath. Leave this in place until your first follow-up appointment.   01/07/23 0913            Diagnostic Studies: No results found.  Disposition: Discharge disposition: 01-Home or Self Care       Discharge Instructions     Call MD / Call 911   Complete by: As directed    If you experience chest pain or shortness of breath, CALL 911 and be transported to the hospital emergency room.  If you develope a fever above 101 F, pus (white drainage) or increased drainage or redness at the wound, or calf pain, call your surgeon's office.   Change dressing   Complete by: As directed    You may remove the bulky bandage (ACE wrap and gauze) two days after surgery. You will have an adhesive waterproof bandage underneath. Leave this in place until your first follow-up appointment.   Constipation Prevention   Complete by: As directed    Drink plenty of fluids.  Prune juice may be helpful.  You may use a stool softener, such as Colace (over the counter) 100 mg twice a day.  Use  MiraLax (over the counter) for constipation as needed.   Diet - low sodium heart healthy   Complete by: As directed    Do not put a pillow under the knee. Place it under the heel.   Complete by: As directed    Driving restrictions   Complete by: As directed    No driving for two weeks   Post-operative opioid taper instructions:   Complete by: As directed    POST-OPERATIVE OPIOID TAPER INSTRUCTIONS: It is important to wean off of your opioid medication as soon as possible. If you do  not need pain medication after your surgery it is ok to stop day one. Opioids include: Codeine, Hydrocodone(Norco, Vicodin), Oxycodone(Percocet, oxycontin) and hydromorphone amongst others.  Long term and even short term use of opiods can cause: Increased pain response Dependence Constipation Depression Respiratory depression And more.  Withdrawal symptoms can include Flu like symptoms Nausea, vomiting And more Techniques to manage these symptoms Hydrate well Eat regular healthy meals Stay active Use relaxation techniques(deep breathing, meditating, yoga) Do Not substitute Alcohol to help with tapering If you have been on opioids for less than two weeks and do not have pain than it is ok to stop all together.  Plan to wean off of opioids This plan should start within one week post op of your joint replacement. Maintain the same interval or time between taking each dose and first decrease the dose.  Cut the total daily intake of opioids by one tablet each day Next start to increase the time between doses. The last dose that should be eliminated is the evening dose.      TED hose   Complete by: As directed    Use stockings (TED hose) for three weeks on both leg(s).  You may remove them at night for sleeping.   Weight bearing as tolerated   Complete by: As directed         Follow-up Information     Aluisio, Homero Fellers, MD. Schedule an appointment as soon as possible for a visit in 2 week(s).    Specialty: Orthopedic Surgery Contact information: [address] [place] [address] [place] [address] [phone]         [facility] Follow up.   Why: [facility] will provide PT in the home after discharge.                 Signed: Arther Abbott 01/19/2023, 8:25 AM
